# Patient Record
Sex: Female | Born: 1950 | Race: White | Hispanic: No | Smoking: Never smoker
Health system: Southern US, Community
[De-identification: ages and names within clinical notes are randomized; demographics above are authoritative.]

## PROBLEM LIST (undated history)

## (undated) DIAGNOSIS — I10 Essential (primary) hypertension: Secondary | ICD-10-CM

## (undated) DIAGNOSIS — F329 Major depressive disorder, single episode, unspecified: Secondary | ICD-10-CM

## (undated) DIAGNOSIS — Z8249 Family history of ischemic heart disease and other diseases of the circulatory system: Secondary | ICD-10-CM

## (undated) DIAGNOSIS — H219 Unspecified disorder of iris and ciliary body: Secondary | ICD-10-CM

## (undated) DIAGNOSIS — F419 Anxiety disorder, unspecified: Secondary | ICD-10-CM

## (undated) DIAGNOSIS — G43909 Migraine, unspecified, not intractable, without status migrainosus: Secondary | ICD-10-CM

## (undated) DIAGNOSIS — G894 Chronic pain syndrome: Secondary | ICD-10-CM

## (undated) DIAGNOSIS — E079 Disorder of thyroid, unspecified: Secondary | ICD-10-CM

## (undated) DIAGNOSIS — E119 Type 2 diabetes mellitus without complications: Secondary | ICD-10-CM

## (undated) DIAGNOSIS — K589 Irritable bowel syndrome without diarrhea: Secondary | ICD-10-CM

## (undated) DIAGNOSIS — F32A Depression, unspecified: Secondary | ICD-10-CM

## (undated) DIAGNOSIS — G5600 Carpal tunnel syndrome, unspecified upper limb: Secondary | ICD-10-CM

## (undated) DIAGNOSIS — E785 Hyperlipidemia, unspecified: Secondary | ICD-10-CM

## (undated) DIAGNOSIS — K219 Gastro-esophageal reflux disease without esophagitis: Secondary | ICD-10-CM

## (undated) HISTORY — DX: Gastro-esophageal reflux disease without esophagitis: K21.9

## (undated) HISTORY — DX: Family history of ischemic heart disease and other diseases of the circulatory system: Z82.49

## (undated) HISTORY — PX: KNEE SURGERY: SHX244

## (undated) HISTORY — DX: Major depressive disorder, single episode, unspecified: F32.9

## (undated) HISTORY — PX: MENISCUS REPAIR: SHX5179

## (undated) HISTORY — DX: Depression, unspecified: F32.A

## (undated) HISTORY — DX: Unspecified disorder of iris and ciliary body: H21.9

## (undated) HISTORY — DX: Hyperlipidemia, unspecified: E78.5

## (undated) HISTORY — DX: Disorder of thyroid, unspecified: E07.9

## (undated) HISTORY — DX: Carpal tunnel syndrome, unspecified upper limb: G56.00

## (undated) HISTORY — PX: MULTIPLE TOOTH EXTRACTIONS: SHX2053

## (undated) HISTORY — PX: OTHER SURGICAL HISTORY: SHX169

## (undated) HISTORY — PX: VAGINAL HYSTERECTOMY: SUR661

## (undated) HISTORY — DX: Migraine, unspecified, not intractable, without status migrainosus: G43.909

## (undated) HISTORY — DX: Type 2 diabetes mellitus without complications: E11.9

## (undated) HISTORY — PX: COLPOSCOPY: SHX161

## (undated) HISTORY — DX: Anxiety disorder, unspecified: F41.9

## (undated) HISTORY — DX: Essential (primary) hypertension: I10

## (undated) HISTORY — DX: Irritable bowel syndrome, unspecified: K58.9

## (undated) HISTORY — DX: Chronic pain syndrome: G89.4

---

## 1997-07-03 ENCOUNTER — Other Ambulatory Visit: Admission: RE | Admit: 1997-07-03 | Discharge: 1997-07-03 | Payer: Self-pay | Admitting: Obstetrics and Gynecology

## 1998-11-19 ENCOUNTER — Other Ambulatory Visit: Admission: RE | Admit: 1998-11-19 | Discharge: 1998-11-19 | Payer: Self-pay | Admitting: Obstetrics and Gynecology

## 1999-01-25 ENCOUNTER — Encounter (INDEPENDENT_AMBULATORY_CARE_PROVIDER_SITE_OTHER): Payer: Self-pay

## 1999-01-25 ENCOUNTER — Ambulatory Visit (HOSPITAL_COMMUNITY): Admission: RE | Admit: 1999-01-25 | Discharge: 1999-01-25 | Payer: Self-pay | Admitting: Obstetrics and Gynecology

## 1999-12-17 ENCOUNTER — Other Ambulatory Visit: Admission: RE | Admit: 1999-12-17 | Discharge: 1999-12-17 | Payer: Self-pay | Admitting: Obstetrics and Gynecology

## 2001-03-22 ENCOUNTER — Other Ambulatory Visit: Admission: RE | Admit: 2001-03-22 | Discharge: 2001-03-22 | Payer: Self-pay | Admitting: Obstetrics and Gynecology

## 2001-05-17 ENCOUNTER — Encounter: Admission: RE | Admit: 2001-05-17 | Discharge: 2001-08-15 | Payer: Self-pay

## 2002-11-08 ENCOUNTER — Other Ambulatory Visit: Admission: RE | Admit: 2002-11-08 | Discharge: 2002-11-08 | Payer: Self-pay | Admitting: Obstetrics and Gynecology

## 2003-02-23 ENCOUNTER — Ambulatory Visit (HOSPITAL_COMMUNITY): Admission: RE | Admit: 2003-02-23 | Discharge: 2003-02-23 | Payer: Self-pay | Admitting: Rheumatology

## 2004-02-08 ENCOUNTER — Ambulatory Visit (HOSPITAL_COMMUNITY): Admission: RE | Admit: 2004-02-08 | Discharge: 2004-02-08 | Payer: Self-pay | Admitting: Neurology

## 2004-03-01 ENCOUNTER — Ambulatory Visit: Payer: Self-pay | Admitting: Neurology

## 2005-03-25 ENCOUNTER — Ambulatory Visit: Payer: Self-pay | Admitting: Orthopedic Surgery

## 2005-04-08 ENCOUNTER — Encounter: Admission: RE | Admit: 2005-04-08 | Discharge: 2005-04-08 | Payer: Self-pay | Admitting: Neurology

## 2005-05-01 ENCOUNTER — Encounter: Admission: RE | Admit: 2005-05-01 | Discharge: 2005-05-01 | Payer: Self-pay | Admitting: Neurology

## 2005-07-16 ENCOUNTER — Ambulatory Visit: Payer: Self-pay | Admitting: Gastroenterology

## 2008-03-13 ENCOUNTER — Ambulatory Visit: Payer: Self-pay | Admitting: Unknown Physician Specialty

## 2008-03-13 ENCOUNTER — Ambulatory Visit: Payer: Self-pay | Admitting: Cardiology

## 2008-04-03 ENCOUNTER — Ambulatory Visit: Payer: Self-pay | Admitting: Cardiology

## 2008-04-20 HISTORY — PX: CARPAL TUNNEL RELEASE: SHX101

## 2008-07-12 ENCOUNTER — Ambulatory Visit: Payer: Self-pay | Admitting: Unknown Physician Specialty

## 2008-07-18 ENCOUNTER — Ambulatory Visit: Payer: Self-pay | Admitting: Unknown Physician Specialty

## 2008-12-20 ENCOUNTER — Ambulatory Visit: Payer: Self-pay | Admitting: Internal Medicine

## 2009-09-08 ENCOUNTER — Ambulatory Visit: Payer: Self-pay | Admitting: Diagnostic Radiology

## 2009-09-08 ENCOUNTER — Ambulatory Visit (HOSPITAL_BASED_OUTPATIENT_CLINIC_OR_DEPARTMENT_OTHER): Admission: RE | Admit: 2009-09-08 | Discharge: 2009-09-08 | Payer: Self-pay | Admitting: Rheumatology

## 2010-02-10 ENCOUNTER — Encounter: Payer: Self-pay | Admitting: Neurology

## 2010-05-21 ENCOUNTER — Institutional Professional Consult (permissible substitution): Payer: Self-pay | Admitting: Internal Medicine

## 2010-06-07 NOTE — Op Note (Signed)
NAMEDAYLYNN, STUMPP NO.:  [id number]   MEDICAL RECORD NO.:  [id number]          PATIENT TYPE:  OUT   LOCATION:  [facility]                          FACILITY:  [facility]   PHYSICIAN:  Marlan Palau, M.D.  DATE OF BIRTH:  [birth year]   DATE OF PROCEDURE:  02/08/2004  DATE OF DISCHARGE:                                 OPERATIVE REPORT   HISTORY:  This is a 60 year old patient with a history of memory disturbance  and gait disorder with white matter changes on MRI.  The patient is being  evaluated for these changes.   A lumbar puncture was performed with the patient in the fetal position on  the left side.  The low back was cleaned with Betadine solution, and  approximately 2 mL of 1% Xylocaine was used as a local anesthetic.  A 20-  gauge spinal needle was inserted into the L3-4 interspace and approximately  16 mL of clear, colorless spinal fluid was removed for testing.  The opening  pressure was 180 mmH2O.  Tube #1 was sent for VDRL, cryptococcal antigen,  angiotensin-converting enzyme level.  Tube #2 was sent for oligoclonal  banding, IgG/albumin ratio.  Tube #3 was sent for cells, differential,  glucose, protein.  Tube #4 was sent for Lyme antibody panel.  Blood work was  sent for ANA, rheumatoid factor, sedimentation rate, antiphospholipid  antibody panel, lupus anticoagulant antibody, factor V Leiden.  There were  no complications of the above procedure.  The patient tolerated the  procedure well.       CKW/MEDQ  D:  02/08/2004  T:  02/08/2004  Job:  [id number]

## 2010-06-07 NOTE — Op Note (Signed)
[facility]  Patient:    Shannon Flores                        MRN: [id number] Proc. Date: 01/25/99 Adm. Date:  [id number] Disc. Date: [id number] Attending:  Morene Antu                           Operative Report  PREOPERATIVE DIAGNOSES:  Menorrhagia; thickened endometrium.  POSTOPERATIVE DIAGNOSIS:  Menorrhagia; thickened endometrium.  PROCEDURE:  Dilatation and curettage; hysteroscopy; resectoscopic excision of thickened endometrium.  SURGEON:  Sherry A. Rosalio Macadamia, M.D.  ANESTHESIA:  MAC.  INDICATIONS:  This is a 60 year old G3, P3-0-0-2 woman who has been having irregular and heavy bleeding.  Patient had ultrasound revealing very thickened endometrium, consistent with polypoid tissue; therefore, the patient is brought to the operating room for D&C/hysteroscopy with resection.  FINDINGS:  Normal-size anteflexed uterus with polypoid endometrium.  DESCRIPTION OF PROCEDURE:  Patient was brought into the operating room, given adequate IV sedation, she was placed in the dorsal lithotomy position and her perineum and vagina were washed with Betadine.  Patient was draped in a sterile  fashion.  Paracervical block was administered with 1% Nesacaine.  Anterior lip f the cervix was grasped with a single-tooth tenaculum.  Cervix was sounded. Cervix was dilated with Pratt dilators to a #31.  The resectoscope was easily introduced into the endometrial cavity; pictures were obtained.  Using a double right-angled loop at 190 watts, the polypoid tissue was resected in sheets.  Once this was completed, adequate hemostasis was present.  Pictures were obtained.  All instruments were then removed from the vagina.  Patient was taken out of the dorsal lithotomy position, she was awakened and she was moved from the operating table to a stretcher in stable condition.  Complications were none.  Estimated blood loss: Less than 5 cc. DD:   02/20/99 TD:  02/20/99 Job: [id number]
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
## 2010-06-10 ENCOUNTER — Institutional Professional Consult (permissible substitution): Payer: Self-pay | Admitting: Internal Medicine

## 2010-06-24 ENCOUNTER — Ambulatory Visit (INDEPENDENT_AMBULATORY_CARE_PROVIDER_SITE_OTHER): Payer: Medicare Other | Admitting: Internal Medicine

## 2010-06-24 ENCOUNTER — Encounter: Payer: Self-pay | Admitting: Internal Medicine

## 2010-06-24 DIAGNOSIS — R000 Tachycardia, unspecified: Secondary | ICD-10-CM

## 2010-06-24 DIAGNOSIS — R9431 Abnormal electrocardiogram [ECG] [EKG]: Secondary | ICD-10-CM

## 2010-06-24 DIAGNOSIS — I498 Other specified cardiac arrhythmias: Secondary | ICD-10-CM

## 2010-06-24 NOTE — Assessment & Plan Note (Signed)
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 She is on multiple medications. Has not yet had a chance to review the interplay between the 2.  Genetic evaluation is probably are most efficient pool at this juncture. I will look into getting this performed.

## 2010-06-24 NOTE — Assessment & Plan Note (Signed)
The patient has sinus tachycardia. She apparently has had sinus tachycardia at 1:30 or 40 years ago intermittently. Her TSH is a little bit low. This is being followed by Dr. Graciela Husbands

## 2010-06-24 NOTE — Progress Notes (Signed)
HPI: Shannon Flores is a 60 y.o. female Seen for possible long QT syndrome. 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  These episodes have occurred typically with a prodrome of flushing warmth and nausea. She does not recall recovery fatigue. They have been associated with migraine headaches as well as been in the emergency room with her children.  The family history is notable for her 72 year old son dying suddenly. He had antecedent history of syncope. His autopsy apparently showed myocarditis though not fulminant. Postmortem diagnosis of long QT was made based on an abnormal response to exercise by his younger sister. I had seen her subsequently and was not convinced of a diagnosis of long QT. She was referred to Dr. Kathryne Sharper at the [facility] who felt that she did not have long QT. This obviously raises the question as to what the diagnosis was of the deceased son.  The patient takes a whole slew of medications. Current Outpatient Prescriptions  Medication Sig Dispense Refill  . aspirin 81 MG EC tablet Take 81 mg by mouth daily.        Marland Kitchen atenolol (TENORMIN) 100 MG tablet Take 100 mg by mouth daily.        Marland Kitchen buPROPion (WELLBUTRIN SR) 150 MG 12 hr tablet 150 mg. Take two tablet each am and one tablet daily        . calcium carbonate (OS-CAL) 600 MG TABS Take 600 mg by mouth 2 (two) times daily with a meal.        . cetirizine (ZYRTEC) 10 MG tablet Take 10 mg by mouth daily.        . Cholecalciferol (VITAMIN D) 400 UNITS capsule Take 400 Units by mouth daily.        . citalopram (CELEXA) 40 MG tablet Take 40 mg by mouth daily.        . cyclobenzaprine (FLEXERIL) 10 MG tablet Take 10 mg by mouth 2 (two) times daily as  needed.        Marland Kitchen estradiol (CLIMARA - DOSED IN MG/24 HR) 0.0375 mg/24hr Place 1 patch onto the skin once a week.        . estrogens, conjugated, (PREMARIN) 0.625 MG tablet Take 0.625 mg by mouth daily. Take daily for 21 days then do not take for 7 days.       . Eszopiclone (ESZOPICLONE) 3 MG TABS Take 3 mg by mouth at bedtime. Take immediately before bedtime       . gabapentin (NEURONTIN) 300 MG capsule 300 mg. Take one capsule by mouth at bedtime.       Marland Kitchen levothyroxine (SYNTHROID, LEVOTHROID) 50 MCG tablet Take 50 mcg by mouth daily.        . Multiple Vitamin (MULTIVITAMIN) tablet Take 1 tablet by mouth daily.        . progesterone (PROMETRIUM) 100 MG capsule Take 100 mg by mouth daily.        . RABEprazole (ACIPHEX) 20 MG tablet Take 20 mg by mouth daily.        Marland Kitchen DISCONTD: aspirin 325 MG tablet Take 325 mg by mouth daily.        Marland Kitchen ALPRAZolam (XANAX) 0.5 MG tablet Take 0.5 mg by mouth 3 (three) times daily as needed.        Marland Kitchen  DISCONTD: albuterol (PROVENTIL,VENTOLIN) 90 MCG/ACT inhaler Inhale 2 puffs into the lungs every 6 (six) hours as needed.        Marland Kitchen DISCONTD: RABEprazole (ACIPHEX) 20 MG tablet 20 mg. Take one table twice a day       . DISCONTD: terconazole (TERAZOL 7) 0.4 % vaginal cream Place 1 applicator vaginally at bedtime.        Marland Kitchen DISCONTD: triamcinolone (KENALOG) 0.1 % ointment Apply topically 2 (two) times daily.          Allergies  Allergen Reactions  . Benadryl (Altaryl)   . Ceclor (Cefaclor)   . Diflucan (Fluconazole, Injectable)   . Epinephrine   . Erythromycin   . Imitrex (Sumatriptan Base)   . Iodine     IV Dye, Iodine containing contrast media group   . Iohexol   . Naprelan (Naproxen Sodium)     rash  . Provera (Medroxyprogesterone Acetate)   . Prozac (Fluoxetine Hcl)   . Sulfa Antibiotics   . Tetracyclines & Related     Past Medical History  Diagnosis Date  . Hypertension   . Thyroid disease     hypothyroidism  . Depression   . Anxiety   . GERD  (gastroesophageal reflux disease)   . Asthma   . Allergic rhinitis   . IBS (irritable bowel syndrome)   . Migraine headache   . Chronic pain syndrome   . Family history of long QT syndrome   . Non-cystic lesion of iris   . Hyperlipidemia   . Carpal tunnel syndrome     Past Surgical History  Procedure Date  . Vaginal hysterectomy   . Meniscal tear   . Meniscus repair     right knee   . Carpal tunnel release 04/2008  . Colposcopy   . Multiple tooth extractions     Family History  Problem Relation Age of Onset  . Hypertension Other   . Lung cancer      History   Social History  . Marital Status: Divorced    Spouse Name: N/A    Number of Children: N/A  . Years of Education: N/A   Occupational History  . Not on file.   Social History Main Topics  . Smoking status: Never Smoker   . Smokeless tobacco: Never Used  . Alcohol Use: No  . Drug Use: No  . Sexually Active:    Other Topics Concern  . Not on file   Social History Narrative  . No narrative on file    Fourteen point review of systems was negative except as noted in HPI and PMH   PHYSICAL EXAMINATION  Blood pressure 108/68, pulse 112, height 5\' 7"  (1.702 m), weight 274 lb 12.8 oz (124.648 kg).   Well developed and nourished in no acute distress HENT normal except she is edentulous Neck supple with JVP-flat Carotids brisk and full without bruits Back without scoliosis or kyphosis Clear Regular rate and rhythm, no murmurs or gallops Abd-soft with active BS without hepatomegaly or midline pulsation Femoral pulses 2+ distal pulses intact No Clubbing cyanosis edema Skin-warm and dry LN-neg submandibular and supraclavicular A & Oriented CN 3-12 normal  Grossly normal sensory and motor function Affect somewhat anxious . ECG today demonstrates sinus rhythm at 110 with intervals of 0.12/0.10/0.38 with a QTC of 0.56.  Electrocardiogram from [facility] dated 2010 demonstrated sinus rhythm at 88 with intervals  of 0.16/0.09/0.40 the QTC of 0.48.no other cardiograms are available for review

## 2010-07-30 ENCOUNTER — Encounter: Payer: Self-pay | Admitting: Internal Medicine

## 2010-12-06 IMAGING — CR DG CHEST 2V
1 series · 2 of 2 positions shown · non-contrast
Comparison: none

REASON FOR EXAM: htn
COMMENTS:

PROCEDURE:     DXR - DXR CHEST PA (OR AP) AND LATERAL  - March 13, 2008  [DATE]
RESULT:     The lungs are clear. Cardiovascular structures are unremarkable.

[Series 1: view not recorded · 0.17mm/px · 2 of 2 slices shown]
[im 1/2]
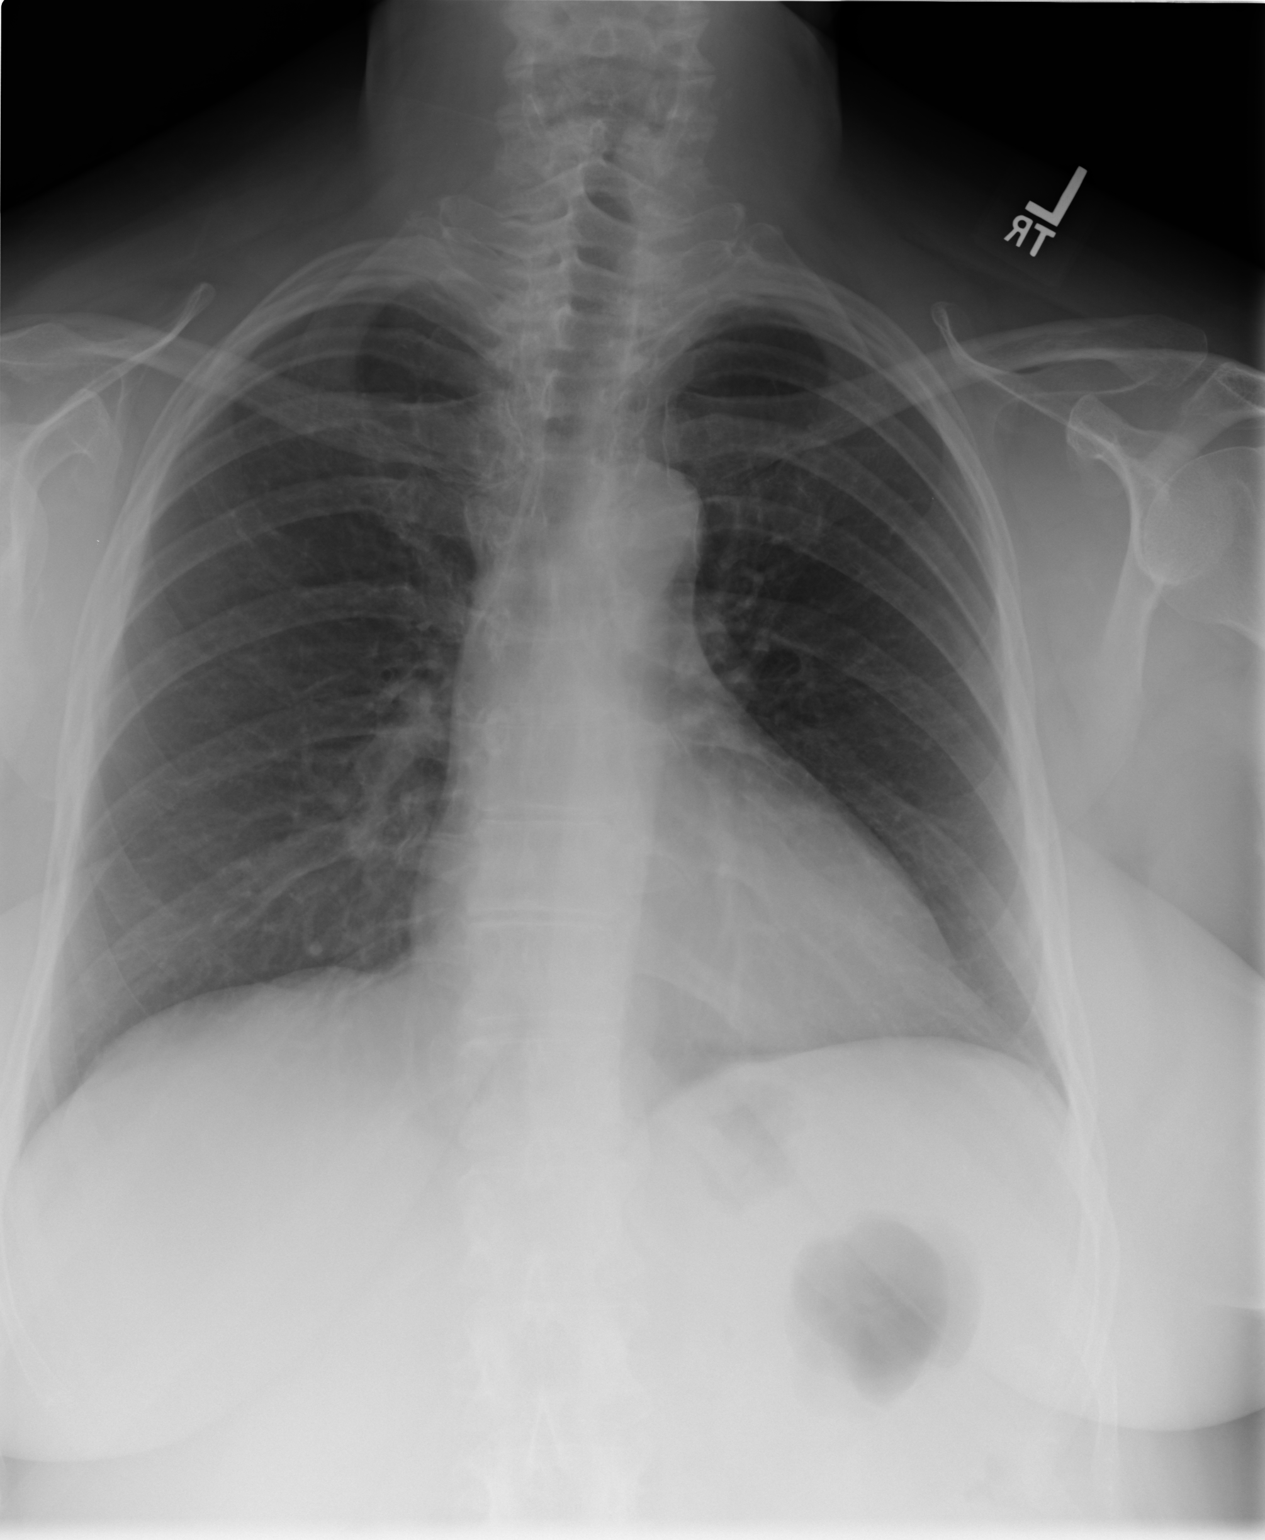
[im 2/2]
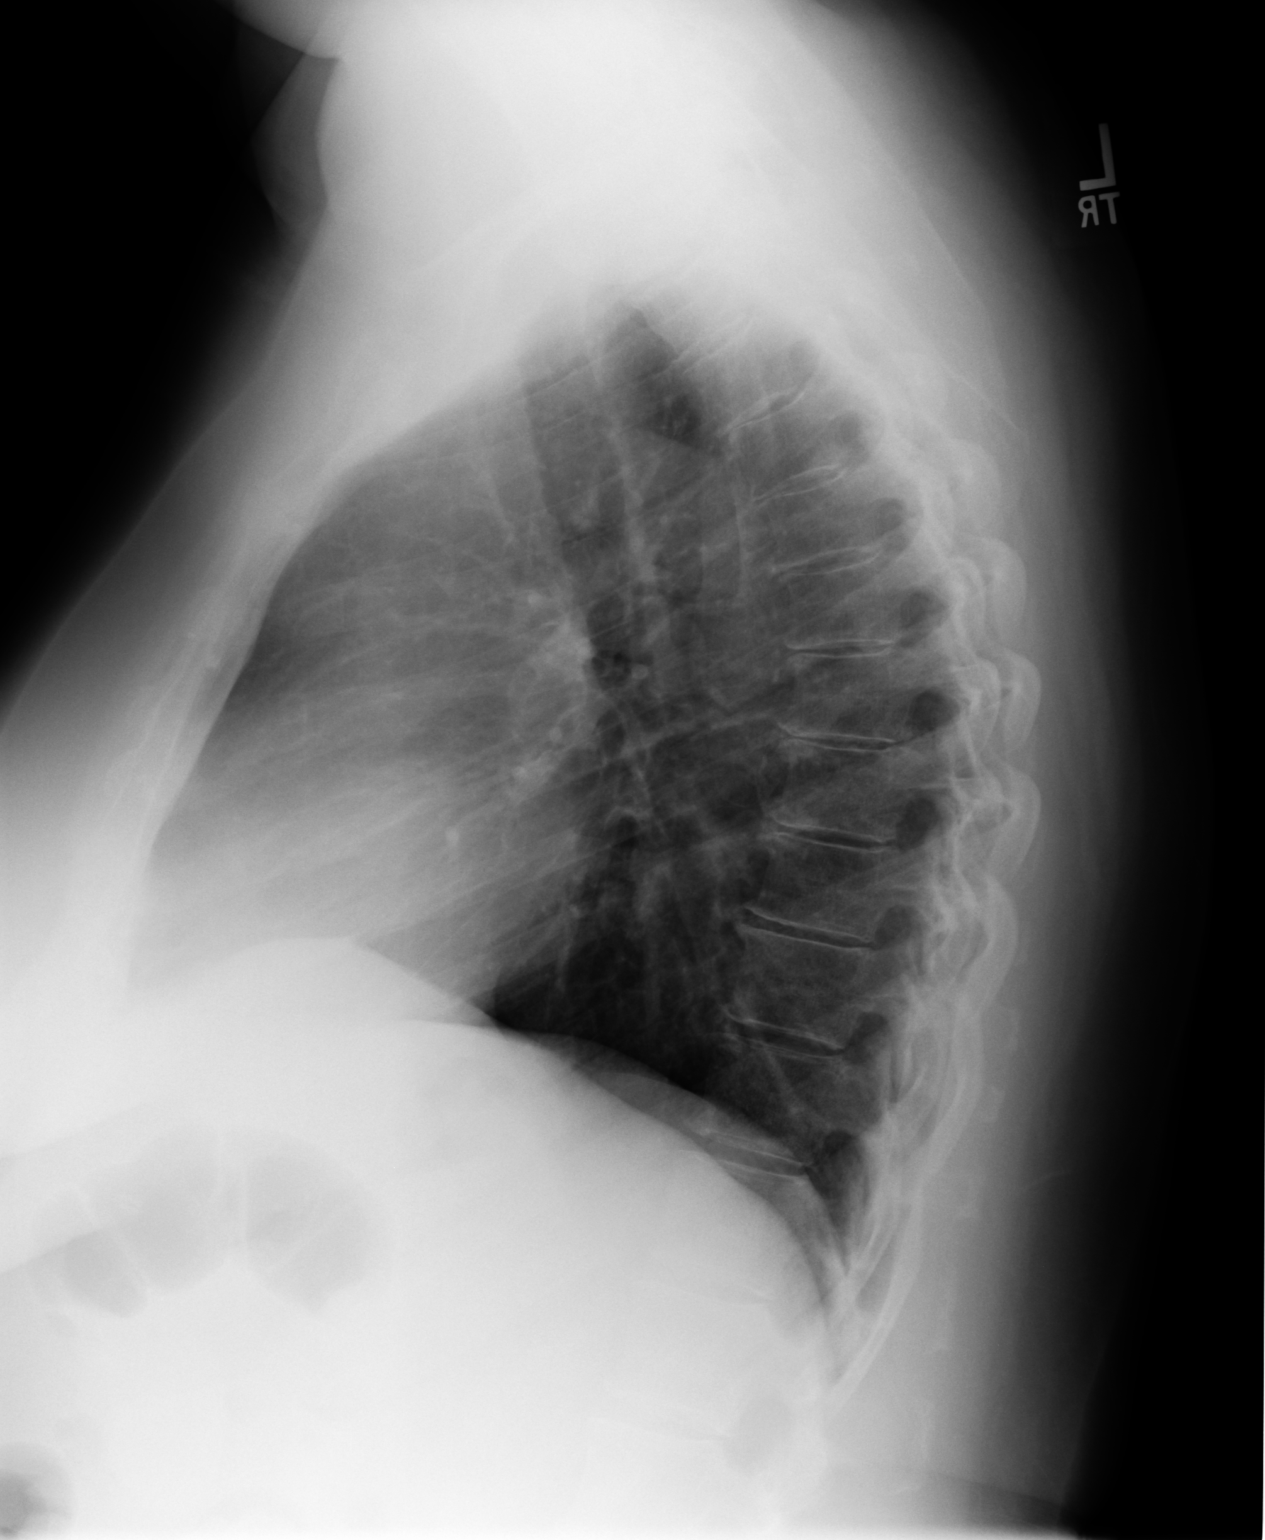

[2 of 2 positions shown; findings below may reference images not displayed]

IMPRESSION: 1. No acute cardiopulmonary disease.

## 2011-10-30 ENCOUNTER — Encounter: Payer: Self-pay | Admitting: Internal Medicine

## 2014-03-20 DIAGNOSIS — N3941 Urge incontinence: Secondary | ICD-10-CM | POA: Insufficient documentation

## 2014-03-20 DIAGNOSIS — R270 Ataxia, unspecified: Secondary | ICD-10-CM | POA: Insufficient documentation

## 2015-02-14 ENCOUNTER — Other Ambulatory Visit: Payer: Self-pay | Admitting: Internal Medicine

## 2015-02-14 DIAGNOSIS — R29898 Other symptoms and signs involving the musculoskeletal system: Secondary | ICD-10-CM

## 2015-02-14 DIAGNOSIS — M5136 Other intervertebral disc degeneration, lumbar region: Secondary | ICD-10-CM | POA: Insufficient documentation

## 2015-02-22 ENCOUNTER — Ambulatory Visit: Payer: Medicare Other

## 2015-06-01 ENCOUNTER — Encounter: Payer: Self-pay | Admitting: Physical Medicine & Rehabilitation

## 2015-06-13 ENCOUNTER — Encounter: Payer: Self-pay | Admitting: Physical Medicine & Rehabilitation

## 2015-06-13 ENCOUNTER — Encounter: Payer: Medicare Other | Attending: Physical Medicine & Rehabilitation | Admitting: Physical Medicine & Rehabilitation

## 2015-06-13 VITALS — BP 124/84 | HR 80 | Resp 16

## 2015-06-13 DIAGNOSIS — Z7982 Long term (current) use of aspirin: Secondary | ICD-10-CM | POA: Insufficient documentation

## 2015-06-13 DIAGNOSIS — G8929 Other chronic pain: Secondary | ICD-10-CM | POA: Insufficient documentation

## 2015-06-13 DIAGNOSIS — M150 Primary generalized (osteo)arthritis: Secondary | ICD-10-CM | POA: Diagnosis not present

## 2015-06-13 DIAGNOSIS — R269 Unspecified abnormalities of gait and mobility: Secondary | ICD-10-CM | POA: Diagnosis not present

## 2015-06-13 DIAGNOSIS — J45909 Unspecified asthma, uncomplicated: Secondary | ICD-10-CM | POA: Insufficient documentation

## 2015-06-13 DIAGNOSIS — K219 Gastro-esophageal reflux disease without esophagitis: Secondary | ICD-10-CM | POA: Insufficient documentation

## 2015-06-13 DIAGNOSIS — G5600 Carpal tunnel syndrome, unspecified upper limb: Secondary | ICD-10-CM | POA: Diagnosis not present

## 2015-06-13 DIAGNOSIS — R252 Cramp and spasm: Secondary | ICD-10-CM | POA: Insufficient documentation

## 2015-06-13 DIAGNOSIS — M62838 Other muscle spasm: Secondary | ICD-10-CM | POA: Diagnosis not present

## 2015-06-13 DIAGNOSIS — M2140 Flat foot [pes planus] (acquired), unspecified foot: Secondary | ICD-10-CM | POA: Insufficient documentation

## 2015-06-13 DIAGNOSIS — R200 Anesthesia of skin: Secondary | ICD-10-CM | POA: Insufficient documentation

## 2015-06-13 DIAGNOSIS — Z90710 Acquired absence of both cervix and uterus: Secondary | ICD-10-CM | POA: Diagnosis not present

## 2015-06-13 DIAGNOSIS — G43909 Migraine, unspecified, not intractable, without status migrainosus: Secondary | ICD-10-CM | POA: Insufficient documentation

## 2015-06-13 DIAGNOSIS — IMO0002 Reserved for concepts with insufficient information to code with codable children: Secondary | ICD-10-CM

## 2015-06-13 DIAGNOSIS — G894 Chronic pain syndrome: Secondary | ICD-10-CM | POA: Diagnosis not present

## 2015-06-13 DIAGNOSIS — G5603 Carpal tunnel syndrome, bilateral upper limbs: Secondary | ICD-10-CM | POA: Diagnosis not present

## 2015-06-13 DIAGNOSIS — E785 Hyperlipidemia, unspecified: Secondary | ICD-10-CM | POA: Diagnosis not present

## 2015-06-13 DIAGNOSIS — R9431 Abnormal electrocardiogram [ECG] [EKG]: Secondary | ICD-10-CM

## 2015-06-13 DIAGNOSIS — G43709 Chronic migraine without aura, not intractable, without status migrainosus: Secondary | ICD-10-CM | POA: Diagnosis not present

## 2015-06-13 DIAGNOSIS — J309 Allergic rhinitis, unspecified: Secondary | ICD-10-CM | POA: Diagnosis not present

## 2015-06-13 DIAGNOSIS — Z79899 Other long term (current) drug therapy: Secondary | ICD-10-CM | POA: Insufficient documentation

## 2015-06-13 DIAGNOSIS — M797 Fibromyalgia: Secondary | ICD-10-CM | POA: Diagnosis not present

## 2015-06-13 DIAGNOSIS — Z5181 Encounter for therapeutic drug level monitoring: Secondary | ICD-10-CM | POA: Diagnosis not present

## 2015-06-13 DIAGNOSIS — I10 Essential (primary) hypertension: Secondary | ICD-10-CM | POA: Insufficient documentation

## 2015-06-13 DIAGNOSIS — M170 Bilateral primary osteoarthritis of knee: Secondary | ICD-10-CM | POA: Diagnosis not present

## 2015-06-13 DIAGNOSIS — E079 Disorder of thyroid, unspecified: Secondary | ICD-10-CM | POA: Diagnosis not present

## 2015-06-13 DIAGNOSIS — M159 Polyosteoarthritis, unspecified: Secondary | ICD-10-CM

## 2015-06-13 DIAGNOSIS — E039 Hypothyroidism, unspecified: Secondary | ICD-10-CM | POA: Diagnosis not present

## 2015-06-13 DIAGNOSIS — F418 Other specified anxiety disorders: Secondary | ICD-10-CM | POA: Diagnosis not present

## 2015-06-13 DIAGNOSIS — I4581 Long QT syndrome: Secondary | ICD-10-CM | POA: Insufficient documentation

## 2015-06-13 DIAGNOSIS — K589 Irritable bowel syndrome without diarrhea: Secondary | ICD-10-CM | POA: Insufficient documentation

## 2015-06-13 MED ORDER — GABAPENTIN 300 MG PO CAPS: 300.0000 mg | ORAL_CAPSULE | Freq: Three times a day (TID) | ORAL | Status: DC

## 2015-06-13 NOTE — Progress Notes (Signed)
Subjective:    Patient ID: Shannon Flores, female    DOB: 20-Sep-1950, 65 y.o.   MRN: [id number]  HPI  65 y/o female with pmh of CTS, chronic pain, depression, migraines, anxiety, IBS, OA, fibromyalgia presents for pain in joints. She has seen a Rheumatologist for 10 years, referred for knee pain.  She has frequent falls. The pain is in multiple joints, mainly in hands and knees.  She was told she needs knee replacement in b/l knees.  This started ~10 years and getting progressively worse.  It is an achy pain.  It is constant.  Rest, heating pad, gabapentin improves the pain.  Driving exacerbates the pain.  She has associated muscle spasms and numbness.  She denies radiation. She had a NCS/EMG ~1 year ago, diagnosed with CTS.  She had surgery on her left, but not on her right.    Pain Inventory Average Pain 7 Pain Right Now 8 My pain is sharp, stabbing and tingling  In the last 24 hours, has pain interfered with the following? General activity 6 Relation with others 5 Enjoyment of life 4 What TIME of day is your pain at its worst? no selection Sleep (in general) Poor  Pain is worse with: walking and bending Pain improves with: rest, heat/ice, medication and injections Relief from Meds: 6  Mobility walk without assistance how many minutes can you walk? 5-10  Function disabled: date disabled . I need assistance with the following:  meal prep, household duties and shopping  Neuro/Psych bladder control problems numbness spasms depression  Prior Studies x-rays new visit  Physicians involved in your care new visit   Family History  Problem Relation Age of Onset  . Hypertension Other   . Lung cancer     Social History   Social History  . Marital Status: Divorced    Spouse Name: N/A  . Number of Children: N/A  . Years of Education: N/A   Social History Main Topics  . Smoking status: Never Smoker   . Smokeless tobacco: Never Used  . Alcohol Use: No  . Drug  Use: No  . Sexual Activity: Not Asked   Other Topics Concern  . None   Social History Narrative   Past Surgical History  Procedure Laterality Date  . Vaginal hysterectomy    . Meniscal tear    . Meniscus repair      right knee   . Carpal tunnel release  04/2008  . Colposcopy    . Multiple tooth extractions     Past Medical History  Diagnosis Date  . Hypertension   . Thyroid disease     hypothyroidism  . Depression   . Anxiety   . GERD (gastroesophageal reflux disease)   . Asthma   . Allergic rhinitis   . IBS (irritable bowel syndrome)   . Migraine headache   . Chronic pain syndrome   . Family history of long QT syndrome   . Non-cystic lesion of iris   . Hyperlipidemia   . Carpal tunnel syndrome    BP 124/84 mmHg  Pulse 80  Resp 16  SpO2 97%  Opioid Risk Score:   Fall Risk Score:  `1  Depression screen PHQ 2/9  Depression screen PHQ 2/9 06/13/2015  Decreased Interest 1  Down, Depressed, Hopeless 2  PHQ - 2 Score 3  Altered sleeping 2  Tired, decreased energy 1  Change in appetite 2  Feeling bad or failure about yourself  1  Trouble concentrating  2  Moving slowly or fidgety/restless 0  Suicidal thoughts 0  PHQ-9 Score 11  Difficult doing work/chores Somewhat difficult   Current Outpatient Prescriptions on File Prior to Visit  Medication Sig Dispense Refill  . ALPRAZolam (XANAX) 0.5 MG tablet Take 0.5 mg by mouth 3 (three) times daily as needed.      Marland Kitchen aspirin 81 MG EC tablet Take 81 mg by mouth daily.      Marland Kitchen atenolol (TENORMIN) 100 MG tablet Take 100 mg by mouth daily.      Marland Kitchen buPROPion (WELLBUTRIN SR) 150 MG 12 hr tablet 150 mg. Take two tablet each am and one tablet daily      . calcium carbonate (OS-CAL) 600 MG TABS Take 600 mg by mouth 2 (two) times daily with a meal.      . cetirizine (ZYRTEC) 10 MG tablet Take 10 mg by mouth daily.      . Cholecalciferol (VITAMIN D) 400 UNITS capsule Take 400 Units by mouth daily.      . citalopram (CELEXA) 40 MG  tablet Take 40 mg by mouth daily.      . cyclobenzaprine (FLEXERIL) 10 MG tablet Take 10 mg by mouth 2 (two) times daily as needed.      . gabapentin (NEURONTIN) 300 MG capsule 300 mg. Take one capsule by mouth at bedtime.     Marland Kitchen levothyroxine (SYNTHROID, LEVOTHROID) 50 MCG tablet Take 50 mcg by mouth daily.      . Multiple Vitamin (MULTIVITAMIN) tablet Take 1 tablet by mouth daily.      . RABEprazole (ACIPHEX) 20 MG tablet Take 20 mg by mouth daily.       No current facility-administered medications on file prior to visit.     Review of Systems  Constitutional: Positive for unexpected weight change.  Gastrointestinal: Positive for vomiting, diarrhea and constipation.  Endocrine:       High blood sugar  Genitourinary: Positive for difficulty urinating.  All other systems reviewed and are negative.      Objective:   Physical Exam Gen: NAD. Vital signs reviewed HENT: Normocephalic, Atraumatic Eyes: EOMI, Conj WNL Cardio: S1, S2 normal, RRR Pulm: B/l clear to auscultation.  Effort normal Abd: Soft, non-distended, non-tender, BS+ MSK: Gait slow cadence.   TTP b/l knees.    No edema.   +B/l pes planus, hallux valgus. Neuro: CN II-XII grossly intact.    Sensation diminished to light touch in b/l feet  Reflexes 2+ throughout  Strength  4+/5 in all LE myotomes Skin: Warm and Dry    Assessment & Plan:  65 y/o female with pmh of CTS, chronic pain, depression, migraines, anxiety, IBS, OA, fibromyalgia presents for pain in joints.   1. Chronic pain secondary to OA  She had injections, without any more benefit.   She was being weaned from narcotics by Rheum.  She has been seen for pain in various joints at different times  Pt with Pes planus, but does not want orthotics  Pt has been referred to pool therapy, but has not gone  Will refer to PT for evaluation and treatment of knee pain and possible TENs unit  Pt wear different shoes that she is comfortable with   Will order Xray  knees  Follow up with Psychology  2. Falls with abnormality of gait  She has 2 walkers  Educated on environmental modifications  Will order quad cane   3. CTS  Will order resting hand splint to be worn at night  and while driving  4.Muscle spasms  Cont meds   5. Prolonged QT syndrome  Will be mindful when prescribing meds  6. Chronic migraines  States she has tried and failed topomax  She has limitations due to prolonged QT  Has been to ED several time  Will see in 2 weeks for evaluation for botox  8. Fibromyalgia  Will increase Gabapentin to 300 TID  Total time spent with patient , 40 minutes spent counseling.

## 2015-06-21 LAB — TOXASSURE SELECT,+ANTIDEPR,UR

## 2015-06-26 NOTE — Progress Notes (Signed)
Urine drug screen for this encounter is consistent for prescribed medications.   

## 2015-07-10 DIAGNOSIS — E1165 Type 2 diabetes mellitus with hyperglycemia: Secondary | ICD-10-CM | POA: Insufficient documentation

## 2015-07-10 DIAGNOSIS — F3341 Major depressive disorder, recurrent, in partial remission: Secondary | ICD-10-CM | POA: Insufficient documentation

## 2015-07-11 ENCOUNTER — Encounter: Payer: Medicare Other | Admitting: Physical Medicine & Rehabilitation

## 2015-07-18 ENCOUNTER — Encounter: Payer: Medicare Other | Attending: Physical Medicine & Rehabilitation | Admitting: Physical Medicine & Rehabilitation

## 2015-07-18 ENCOUNTER — Encounter: Payer: Self-pay | Admitting: Physical Medicine & Rehabilitation

## 2015-07-18 VITALS — BP 123/81 | HR 80 | Resp 14

## 2015-07-18 DIAGNOSIS — M2142 Flat foot [pes planus] (acquired), left foot: Secondary | ICD-10-CM

## 2015-07-18 DIAGNOSIS — K589 Irritable bowel syndrome without diarrhea: Secondary | ICD-10-CM | POA: Diagnosis not present

## 2015-07-18 DIAGNOSIS — M2141 Flat foot [pes planus] (acquired), right foot: Secondary | ICD-10-CM

## 2015-07-18 DIAGNOSIS — Z7982 Long term (current) use of aspirin: Secondary | ICD-10-CM | POA: Diagnosis not present

## 2015-07-18 DIAGNOSIS — G8929 Other chronic pain: Secondary | ICD-10-CM | POA: Insufficient documentation

## 2015-07-18 DIAGNOSIS — G43709 Chronic migraine without aura, not intractable, without status migrainosus: Secondary | ICD-10-CM | POA: Diagnosis not present

## 2015-07-18 DIAGNOSIS — R252 Cramp and spasm: Secondary | ICD-10-CM | POA: Insufficient documentation

## 2015-07-18 DIAGNOSIS — M62838 Other muscle spasm: Secondary | ICD-10-CM | POA: Diagnosis not present

## 2015-07-18 DIAGNOSIS — I4581 Long QT syndrome: Secondary | ICD-10-CM | POA: Diagnosis not present

## 2015-07-18 DIAGNOSIS — G5600 Carpal tunnel syndrome, unspecified upper limb: Secondary | ICD-10-CM | POA: Insufficient documentation

## 2015-07-18 DIAGNOSIS — Z90710 Acquired absence of both cervix and uterus: Secondary | ICD-10-CM | POA: Diagnosis not present

## 2015-07-18 DIAGNOSIS — Z79899 Other long term (current) drug therapy: Secondary | ICD-10-CM | POA: Diagnosis not present

## 2015-07-18 DIAGNOSIS — K219 Gastro-esophageal reflux disease without esophagitis: Secondary | ICD-10-CM | POA: Insufficient documentation

## 2015-07-18 DIAGNOSIS — M797 Fibromyalgia: Secondary | ICD-10-CM | POA: Insufficient documentation

## 2015-07-18 DIAGNOSIS — F418 Other specified anxiety disorders: Secondary | ICD-10-CM | POA: Diagnosis not present

## 2015-07-18 DIAGNOSIS — R200 Anesthesia of skin: Secondary | ICD-10-CM | POA: Diagnosis not present

## 2015-07-18 DIAGNOSIS — J45909 Unspecified asthma, uncomplicated: Secondary | ICD-10-CM | POA: Insufficient documentation

## 2015-07-18 DIAGNOSIS — M170 Bilateral primary osteoarthritis of knee: Secondary | ICD-10-CM | POA: Insufficient documentation

## 2015-07-18 DIAGNOSIS — E039 Hypothyroidism, unspecified: Secondary | ICD-10-CM | POA: Insufficient documentation

## 2015-07-18 DIAGNOSIS — J309 Allergic rhinitis, unspecified: Secondary | ICD-10-CM | POA: Diagnosis not present

## 2015-07-18 DIAGNOSIS — Z5181 Encounter for therapeutic drug level monitoring: Secondary | ICD-10-CM | POA: Diagnosis not present

## 2015-07-18 DIAGNOSIS — E079 Disorder of thyroid, unspecified: Secondary | ICD-10-CM | POA: Insufficient documentation

## 2015-07-18 DIAGNOSIS — G894 Chronic pain syndrome: Secondary | ICD-10-CM | POA: Diagnosis present

## 2015-07-18 DIAGNOSIS — M2140 Flat foot [pes planus] (acquired), unspecified foot: Secondary | ICD-10-CM | POA: Insufficient documentation

## 2015-07-18 DIAGNOSIS — G5603 Carpal tunnel syndrome, bilateral upper limbs: Secondary | ICD-10-CM | POA: Diagnosis not present

## 2015-07-18 DIAGNOSIS — M159 Polyosteoarthritis, unspecified: Secondary | ICD-10-CM

## 2015-07-18 DIAGNOSIS — G43909 Migraine, unspecified, not intractable, without status migrainosus: Secondary | ICD-10-CM | POA: Diagnosis not present

## 2015-07-18 DIAGNOSIS — M150 Primary generalized (osteo)arthritis: Secondary | ICD-10-CM | POA: Diagnosis not present

## 2015-07-18 DIAGNOSIS — E785 Hyperlipidemia, unspecified: Secondary | ICD-10-CM | POA: Insufficient documentation

## 2015-07-18 DIAGNOSIS — IMO0002 Reserved for concepts with insufficient information to code with codable children: Secondary | ICD-10-CM

## 2015-07-18 DIAGNOSIS — R269 Unspecified abnormalities of gait and mobility: Secondary | ICD-10-CM | POA: Insufficient documentation

## 2015-07-18 DIAGNOSIS — I10 Essential (primary) hypertension: Secondary | ICD-10-CM | POA: Insufficient documentation

## 2015-07-18 DIAGNOSIS — R9431 Abnormal electrocardiogram [ECG] [EKG]: Secondary | ICD-10-CM

## 2015-07-18 MED ORDER — TAPENTADOL HCL 50 MG PO TABS: 50.0000 mg | ORAL_TABLET | Freq: Two times a day (BID) | ORAL | Status: DC | PRN

## 2015-07-18 NOTE — Progress Notes (Addendum)
Subjective:    Patient ID: Shannon Flores, female    DOB: 1950/08/31, 65 y.o.   MRN: [id number]  HPI  65 y/o female with pmh of CTS, chronic pain, depression, migraines, anxiety, IBS, OA, fibromyalgia presents for pain in joints. She has seen a Rheumatologist for 10 years, referred for knee pain.  She has frequent falls. The pain is in multiple joints, mainly in hands and knees.  She was told she needs knee replacement in b/l knees.  This started ~10 years and getting progressively worse.  It is an achy pain.  It is constant.  Rest, heating pad, gabapentin improves the pain.  Driving exacerbates the pain.  She has associated muscle spasms and numbness.  She denies radiation. She had a NCS/EMG ~1 year ago, diagnosed with CTS.  She had surgery on her left, but not on her right.  [facility] visit 06/13/15. She was encouraged to participate in pool therapy, which she still has not done.  She has not gone to PT either, as prescribed.   Since last visit, she has had several falls.  She denies feeling dizzy.  She states she now uses the walker.  She has not obtained the quad cane.  He states she had an xray of her knees, but they are not available for view.  She has not obtained the hand splints yet.  Her Gabapentin was increased to 300 TID.    Migraines today 8/10.  10/month.  Sees spots prior.  Sound exacerbates the pain.    Pain Inventory Average Pain 5 Pain Right Now 9 My pain is sharp and aching  In the last 24 hours, has pain interfered with the following? General activity 5 Relation with others 3 Enjoyment of life 3 What TIME of day is your pain at its worst? daytime Sleep (in general) Fair  Pain is worse with: bending, sitting and some activites Pain improves with: rest, heat/ice and medication Relief from Meds: 5  Mobility walk without assistance how many minutes can you walk? 10 ability to climb steps?  yes do you drive?  yes  Function retired I need assistance with the  following:  household duties and shopping  Neuro/Psych bladder control problems spasms dizziness  Prior Studies Any changes since last visit?  no  Physicians involved in your care Any changes since last visit?  no   Family History  Problem Relation Age of Onset  . Hypertension Other   . Lung cancer     Social History   Social History  . Marital Status: Divorced    Spouse Name: N/A  . Number of Children: N/A  . Years of Education: N/A   Social History Main Topics  . Smoking status: Never Smoker   . Smokeless tobacco: Never Used  . Alcohol Use: No  . Drug Use: No  . Sexual Activity: Not Asked   Other Topics Concern  . None   Social History Narrative   Past Surgical History  Procedure Laterality Date  . Vaginal hysterectomy    . Meniscal tear    . Meniscus repair      right knee   . Carpal tunnel release  04/2008  . Colposcopy    . Multiple tooth extractions     Past Medical History  Diagnosis Date  . Hypertension   . Thyroid disease     hypothyroidism  . Depression   . Anxiety   . GERD (gastroesophageal reflux disease)   . Asthma   .  Allergic rhinitis   . IBS (irritable bowel syndrome)   . Migraine headache   . Chronic pain syndrome   . Family history of long QT syndrome   . Non-cystic lesion of iris   . Hyperlipidemia   . Carpal tunnel syndrome    BP 123/81 mmHg  Pulse 80  Resp 14  SpO2 95%  Opioid Risk Score:   Fall Risk Score:  `1  Depression screen PHQ 2/9  Depression screen PHQ 2/9 06/13/2015  Decreased Interest 1  Down, Depressed, Hopeless 2  PHQ - 2 Score 3  Altered sleeping 2  Tired, decreased energy 1  Change in appetite 2  Feeling bad or failure about yourself  1  Trouble concentrating 2  Moving slowly or fidgety/restless 0  Suicidal thoughts 0  PHQ-9 Score 11  Difficult doing work/chores Somewhat difficult   Current Outpatient Prescriptions on File Prior to Visit  Medication Sig Dispense Refill  . ALPRAZolam (XANAX)  0.5 MG tablet Take 0.5 mg by mouth 3 (three) times daily as needed.      Marland Kitchen aspirin 81 MG EC tablet Take 81 mg by mouth daily.      Marland Kitchen atenolol (TENORMIN) 100 MG tablet Take 100 mg by mouth daily.      Marland Kitchen buPROPion (WELLBUTRIN SR) 150 MG 12 hr tablet 150 mg. Take two tablet each am and one tablet daily      . calcium carbonate (OS-CAL) 600 MG TABS Take 600 mg by mouth 2 (two) times daily with a meal.      . cetirizine (ZYRTEC) 10 MG tablet Take 10 mg by mouth daily.      . Cholecalciferol (VITAMIN D) 400 UNITS capsule Take 400 Units by mouth daily.      . citalopram (CELEXA) 40 MG tablet Take 40 mg by mouth daily.      . cyclobenzaprine (FLEXERIL) 10 MG tablet Take 10 mg by mouth 2 (two) times daily as needed.      . gabapentin (NEURONTIN) 300 MG capsule Take 1 capsule (300 mg total) by mouth 3 (three) times daily. 90 capsule 1  . HYDROcodone-acetaminophen (NORCO/VICODIN) 5-325 MG tablet Take 1 tablet by mouth 2 (two) times daily.    Marland Kitchen levothyroxine (SYNTHROID, LEVOTHROID) 50 MCG tablet Take 50 mcg by mouth daily.      . Multiple Vitamin (MULTIVITAMIN) tablet Take 1 tablet by mouth daily.      . RABEprazole (ACIPHEX) 20 MG tablet Take 20 mg by mouth daily.      Marland Kitchen zolpidem (AMBIEN) 5 MG tablet Take 5 mg by mouth at bedtime as needed for sleep.     No current facility-administered medications on file prior to visit.     Review of Systems  Constitutional: Positive for unexpected weight change. Negative for fever.  HENT: Negative.   Eyes: Negative.   Respiratory: Positive for wheezing.   Gastrointestinal: Positive for vomiting, diarrhea and constipation.  Endocrine:       High blood sugar  Genitourinary: Positive for difficulty urinating.  Musculoskeletal:       Spasms  Skin: Negative.   Allergic/Immunologic: Negative.   Neurological: Positive for dizziness.  Hematological: Negative.   Psychiatric/Behavioral: Negative.   All other systems reviewed and are negative.      Objective:     Physical Exam Gen: NAD. Vital signs reviewed HENT: Normocephalic, Atraumatic Eyes: EOMI, Conj WNL Cardio: S1, S2 normal, RRR Pulm: B/l clear to auscultation.  Effort normal Abd: Soft, non-distended, non-tender, BS+ MSK: Gait  slow cadence.   TTP b/l knees.    No edema.   +B/l pes planus, hallux valgus. Neuro: CN II-XII grossly intact.    Sensation diminished to light touch in b/l feet  Reflexes 2+ throughout  Strength  4+/5 in all LE myotomes Skin: Warm and Dry    Assessment & Plan:  65 y/o female with pmh of CTS, chronic pain, depression, migraines, anxiety, IBS, OA, fibromyalgia presents for followup for pain in joints.   1. Chronic pain secondary to OA  She had injections, without any more benefit.   She was being weaned from narcotics by Rheum.   She has been seen for pain in various joints at different times  Pt with Pes planus, but does not want custom orthotics  Pt has been referred to pool therapy, but still has not gone  PT had not gone to PT yet for evaluation and treatment of knee pain and possible TENs unit as well as gait training  Pt states she had xrays of her knees, but results not available  Cont follow up with Psychology  Pt tearful that she will run out of medications.  Will order Nucynta 50mg  BID for this month only and revisit d/c on next visit based on other interventions and d/c ambien  2. Falls with abnormality of gait  She has 2 walkers  Educated on environmental modifications  Pt has not obtained quad cane   3. CTS  Pt has been fitted, but not obtained resting hand splints at night and while driving  4.Muscle spasms  Cont meds   5. Prolonged QT syndrome  Will be mindful when prescribing meds  6. Chronic migraines  States she has tried and failed topomax  She has limitations due to prolonged QT  Has been to ED several time  Will consider botox in future  7. Fibromyalgia  Cont Gabapentin to 300 TID

## 2015-07-18 NOTE — Addendum Note (Signed)
Addended by: Maryla MorrowPATEL, Kolsen Choe A on: 07/18/2015 01:15 PM   Modules accepted: Orders

## 2015-07-31 ENCOUNTER — Encounter: Payer: Medicare Other | Attending: Internal Medicine | Admitting: *Deleted

## 2015-07-31 ENCOUNTER — Encounter: Payer: Self-pay | Admitting: *Deleted

## 2015-07-31 VITALS — BP 108/74 | Ht 67.0 in | Wt 221.9 lb

## 2015-07-31 DIAGNOSIS — E119 Type 2 diabetes mellitus without complications: Secondary | ICD-10-CM | POA: Diagnosis not present

## 2015-07-31 NOTE — Patient Instructions (Signed)
Check blood sugars 2 x day before breakfast and 2 hrs after supper every other day  Exercise: Begin walking for 10 minutes  3 days a week as tolerated  Eat 3 meals day,  1-2  snacks a day Space meals 4-6 hours apart Don't skip meals Avoid sugar sweetened drinks (soda, tea, sports drinks)  Bring blood sugar records to the next class  Call your doctor for a prescription for:  1. Meter strips (type) One Touch Verio checking   1 times per day  2. Lancets (type) One Touch Delica       checking   1     times per day  Return for classes on:

## 2015-07-31 NOTE — Progress Notes (Signed)
Diabetes Self-Management Education  Visit Type: First/Initial  Appt. Start Time: 1325 Appt. End Time: 1500  07/31/2015  Shannon Flores, identified by name and date of birth, is a 65 y.o. female with a diagnosis of Diabetes: Type 2.   ASSESSMENT  Blood pressure 108/74, height  (1.702 m), weight 221 lb 14.4 oz (100.653 kg). Body mass index is 34.75 kg/(m^2).      Diabetes Self-Management Education - 07/31/15 1517    Visit Information   Visit Type First/Initial   Initial Visit   Diabetes Type Type 2   Are you currently following a meal plan? No   Are you taking your medications as prescribed? Yes   Date Diagnosed Pt reports 3 weeks but per [facility] labs she had diabetes on 01/2014 wtih A1C of 6.5 %   Health Coping   How would you rate your overall health? Fair   Psychosocial Assessment   Patient Belief/Attitude about Diabetes Defeat/Burnout  "frustrated"   Self-care barriers None   Self-management support Doctor's office;Family   Patient Concerns Healthy Lifestyle;Medication;Nutrition/Meal planning   Special Needs None   Preferred Learning Style Hands on;Visual   Learning Readiness Ready   How often do you need to have someone help you when you read instructions, pamphlets, or other written materials from your doctor or pharmacy? 1 - Never   What is the last grade level you completed in school? 12   Pre-Education Assessment   Patient understands the diabetes disease and treatment process. Needs Instruction   Patient understands incorporating nutritional management into lifestyle. Needs Instruction   Patient undertands incorporating physical activity into lifestyle. Needs Instruction   Patient understands using medications safely. Needs Instruction   Patient understands monitoring blood glucose, interpreting and using results Needs Instruction   Patient understands prevention, detection, and treatment of acute complications. Needs Instruction   Patient understands  prevention, detection, and treatment of chronic complications. Needs Instruction   Patient understands how to develop strategies to address psychosocial issues. Needs Instruction   Patient understands how to develop strategies to promote health/change behavior. Needs Instruction   Complications   Last HgB A1C per patient/outside source 7.7 %  07/04/15   How often do you check your blood sugar? 0 times/day (not testing)  Provided One Touch Verio Flex and instructed on use. BG upon return demonstration was 118 mg/dL at 0:98 pm - 5 1/2 hrs pp.    Have you had a dilated eye exam in the past 12 months? Yes   Have you had a dental exam in the past 12 months? No  dentures - currently doesn't have any and will need to get replacements   Are you checking your feet? Yes   How many days per week are you checking your feet? 4   Dietary Intake   Breakfast yogurt, fruit   Lunch skips meals and reports she doesn't cook; eats cottage cheese, salad   Dinner lean cuisine, soup, spaghetti   Beverage(s) water, occasional regular soda and sweet tea; Gatorade   Exercise   Exercise Type ADL's   Patient Education   Previous Diabetes Education No   Disease state  Definition of diabetes, type 1 and 2, and the diagnosis of diabetes   Nutrition management  Role of diet in the treatment of diabetes and the relationship between the three main macronutrients and blood glucose level   Physical activity and exercise  Role of exercise on diabetes management, blood pressure control and cardiac health.   Medications Reviewed  patients medication for diabetes, action, purpose, timing of dose and side effects.   Monitoring Taught/evaluated SMBG meter.;Purpose and frequency of SMBG.;Identified appropriate SMBG and/or A1C goals.   Chronic complications Relationship between chronic complications and blood glucose control   Psychosocial adjustment Identified and addressed patients feelings and concerns about diabetes    Individualized Goals (developed by patient)   Reducing Risk Decrease medications Lead a healthier lifestyle   Outcomes   Expected Outcomes Demonstrated interest in learning. Expect positive outcomes      Individualized Plan for Diabetes Self-Management Training:   Learning Objective:  Patient will have a greater understanding of diabetes self-management. Patient education plan is to attend individual and/or group sessions per assessed needs and concerns.   Plan:   Patient Instructions  Check blood sugars 2 x day before breakfast and 2 hrs after supper every other day Exercise: Begin walking for 10 minutes  3 days a week as tolerated Eat 3 meals day,  1-2  snacks a day Space meals 4-6 hours apart Don't skip meals Avoid sugar sweetened drinks (soda, tea, sports drinks) Bring blood sugar records to the next class Call your doctor for a prescription for:  1. Meter strips (type) One Touch Verio checking   1 times per day  2. Lancets (type) One Touch Delica       checking   1     times per day   Expected Outcomes:  Demonstrated interest in learning. Expect positive outcomes  Education material provided:  General Meal Planning Guidelines Simple Meal Plan Meter - One Touch Verio Flex  If problems or questions, patient to contact team via:  Sharion SettlerSheila Manasseh Pittsley, RN, CCM, CDE [phone]  Future DSME appointment:   Pt to check her other MD appointments and call back to schedule classes. She was provided a class schedule for the next few months.

## 2015-08-15 ENCOUNTER — Ambulatory Visit: Payer: Medicare Other | Admitting: Physical Medicine & Rehabilitation

## 2015-08-23 ENCOUNTER — Telehealth: Payer: Self-pay | Admitting: Physical Medicine & Rehabilitation

## 2015-08-23 NOTE — Telephone Encounter (Signed)
Patient needing a refill on Nucynta.  Please call patient when this is done [phone].

## 2015-08-23 NOTE — Telephone Encounter (Signed)
She has to have an appt to get the Nucynta.  She was supposed to be seen around 08/15/15. She will need to be seen.

## 2015-08-23 NOTE — Telephone Encounter (Signed)
Patient is needing a refill on Neurontin.  Please call her at [phone].  I also called patient and told her she needed to make an appointment, I scheduled her for next Wednesday at 10 with Dr. Allena Katz.

## 2015-08-29 ENCOUNTER — Encounter: Payer: Self-pay | Admitting: Physical Medicine & Rehabilitation

## 2015-08-29 ENCOUNTER — Encounter: Payer: Medicare Other | Attending: Physical Medicine & Rehabilitation | Admitting: Physical Medicine & Rehabilitation

## 2015-08-29 VITALS — BP 137/92 | HR 80

## 2015-08-29 DIAGNOSIS — K219 Gastro-esophageal reflux disease without esophagitis: Secondary | ICD-10-CM | POA: Insufficient documentation

## 2015-08-29 DIAGNOSIS — G5600 Carpal tunnel syndrome, unspecified upper limb: Secondary | ICD-10-CM | POA: Diagnosis not present

## 2015-08-29 DIAGNOSIS — F418 Other specified anxiety disorders: Secondary | ICD-10-CM | POA: Insufficient documentation

## 2015-08-29 DIAGNOSIS — K589 Irritable bowel syndrome without diarrhea: Secondary | ICD-10-CM | POA: Insufficient documentation

## 2015-08-29 DIAGNOSIS — J309 Allergic rhinitis, unspecified: Secondary | ICD-10-CM | POA: Diagnosis not present

## 2015-08-29 DIAGNOSIS — E039 Hypothyroidism, unspecified: Secondary | ICD-10-CM | POA: Insufficient documentation

## 2015-08-29 DIAGNOSIS — M159 Polyosteoarthritis, unspecified: Secondary | ICD-10-CM

## 2015-08-29 DIAGNOSIS — G5603 Carpal tunnel syndrome, bilateral upper limbs: Secondary | ICD-10-CM | POA: Diagnosis not present

## 2015-08-29 DIAGNOSIS — G8929 Other chronic pain: Secondary | ICD-10-CM | POA: Diagnosis not present

## 2015-08-29 DIAGNOSIS — M797 Fibromyalgia: Secondary | ICD-10-CM | POA: Diagnosis not present

## 2015-08-29 DIAGNOSIS — R252 Cramp and spasm: Secondary | ICD-10-CM | POA: Insufficient documentation

## 2015-08-29 DIAGNOSIS — R200 Anesthesia of skin: Secondary | ICD-10-CM | POA: Insufficient documentation

## 2015-08-29 DIAGNOSIS — M62838 Other muscle spasm: Secondary | ICD-10-CM | POA: Insufficient documentation

## 2015-08-29 DIAGNOSIS — Z5181 Encounter for therapeutic drug level monitoring: Secondary | ICD-10-CM | POA: Insufficient documentation

## 2015-08-29 DIAGNOSIS — G43709 Chronic migraine without aura, not intractable, without status migrainosus: Secondary | ICD-10-CM | POA: Diagnosis not present

## 2015-08-29 DIAGNOSIS — J45909 Unspecified asthma, uncomplicated: Secondary | ICD-10-CM | POA: Insufficient documentation

## 2015-08-29 DIAGNOSIS — M170 Bilateral primary osteoarthritis of knee: Secondary | ICD-10-CM | POA: Diagnosis not present

## 2015-08-29 DIAGNOSIS — G43909 Migraine, unspecified, not intractable, without status migrainosus: Secondary | ICD-10-CM | POA: Diagnosis not present

## 2015-08-29 DIAGNOSIS — I4581 Long QT syndrome: Secondary | ICD-10-CM

## 2015-08-29 DIAGNOSIS — E079 Disorder of thyroid, unspecified: Secondary | ICD-10-CM | POA: Insufficient documentation

## 2015-08-29 DIAGNOSIS — E785 Hyperlipidemia, unspecified: Secondary | ICD-10-CM | POA: Insufficient documentation

## 2015-08-29 DIAGNOSIS — I10 Essential (primary) hypertension: Secondary | ICD-10-CM | POA: Insufficient documentation

## 2015-08-29 DIAGNOSIS — G894 Chronic pain syndrome: Secondary | ICD-10-CM | POA: Diagnosis not present

## 2015-08-29 DIAGNOSIS — R269 Unspecified abnormalities of gait and mobility: Secondary | ICD-10-CM | POA: Insufficient documentation

## 2015-08-29 DIAGNOSIS — Z79899 Other long term (current) drug therapy: Secondary | ICD-10-CM | POA: Diagnosis not present

## 2015-08-29 DIAGNOSIS — Z90710 Acquired absence of both cervix and uterus: Secondary | ICD-10-CM | POA: Insufficient documentation

## 2015-08-29 DIAGNOSIS — Z7982 Long term (current) use of aspirin: Secondary | ICD-10-CM | POA: Insufficient documentation

## 2015-08-29 DIAGNOSIS — M150 Primary generalized (osteo)arthritis: Secondary | ICD-10-CM | POA: Diagnosis not present

## 2015-08-29 DIAGNOSIS — IMO0002 Reserved for concepts with insufficient information to code with codable children: Secondary | ICD-10-CM

## 2015-08-29 DIAGNOSIS — M2140 Flat foot [pes planus] (acquired), unspecified foot: Secondary | ICD-10-CM | POA: Insufficient documentation

## 2015-08-29 DIAGNOSIS — R9431 Abnormal electrocardiogram [ECG] [EKG]: Secondary | ICD-10-CM

## 2015-08-29 NOTE — Progress Notes (Addendum)
Subjective:    Patient ID: Shannon Flores, female    DOB: 1950/06/10, 65 y.o.   MRN: [id number]  HPI  65 y/o female with pmh of CTS, chronic pain, depression, migraines, anxiety, IBS, OA, fibromyalgia presents for pain in joints. She has seen a Rheumatologist for 10 years, referred for knee pain.  She has frequent falls. The pain is in multiple joints, mainly in hands and knees.  She was told she needs knee replacement in b/l knees.  This started ~10 years and getting progressively worse.  It is an achy pain.  It is constant.  Rest, heating pad, gabapentin improves the pain.  Driving exacerbates the pain.  She has associated muscle spasms and numbness.  She denies radiation. She had a NCS/EMG ~1 year ago, diagnosed with CTS.  She had surgery on her left, but not on her right.    Last clinic visit 07/18/15. She still has not gone to pool therapy.  She still has not gone to PT either, as prescribed.  She denies falls. She denies feeling dizzy.  She uses a cane for ambulation. She obtained the splint for her right hand, which helps.  She was prescribed Nyucinta and has been out for 3 weeks, which she believes helped.   Today, she has migraines today 9/10.  She has associated nausea.  Now, she states it is daily. Sees spots prior.  Sound exacerbates the pain.     Pain Inventory Average Pain 6 Pain Right Now 8 My pain is sharp and aching  In the last 24 hours, has pain interfered with the following? General activity 8 Relation with others 7 Enjoyment of life 7 What TIME of day is your pain at its worst? all Sleep (in general) Poor  Pain is worse with: standing, unsure and some activites Pain improves with: heat/ice and medication Relief from Meds: 6  Mobility use a cane how many minutes can you walk? 5 ability to climb steps?  yes do you drive?  yes Do you have any goals in this area?  yes  Function retired I need assistance with the following:  household duties and  shopping  Neuro/Psych numbness tingling spasms depression  Prior Studies Any changes since last visit?  no  Physicians involved in your care Any changes since last visit?  no   Family History  Problem Relation Age of Onset  . Diabetes Father   . Hypertension Other   . Lung cancer     Social History   Social History  . Marital status: Divorced    Spouse name: N/A  . Number of children: N/A  . Years of education: N/A   Social History Main Topics  . Smoking status: Never Smoker  . Smokeless tobacco: Never Used  . Alcohol use No  . Drug use: No  . Sexual activity: Not Asked   Other Topics Concern  . None   Social History Narrative  . None   Past Surgical History:  Procedure Laterality Date  . CARPAL TUNNEL RELEASE  04/2008  . COLPOSCOPY    . meniscal tear    . MENISCUS REPAIR     right knee   . MULTIPLE TOOTH EXTRACTIONS    . VAGINAL HYSTERECTOMY     Past Medical History:  Diagnosis Date  . Allergic rhinitis   . Anxiety   . Asthma   . Carpal tunnel syndrome   . Chronic pain syndrome   . Depression   . Diabetes mellitus without complication (  HCC)   . Family history of long QT syndrome   . GERD (gastroesophageal reflux disease)   . Hyperlipidemia   . Hypertension   . IBS (irritable bowel syndrome)   . Migraine headache   . Non-cystic lesion of iris   . Thyroid disease    hypothyroidism   BP (!) 137/92   Pulse 80   SpO2 94%   Opioid Risk Score:   Fall Risk Score:  `1  Depression screen PHQ 2/9  [facility] 07/31/2015 06/13/2015  Decreased Interest 2 1  Down, Depressed, Hopeless 2 2  PHQ - 2 Score 4 3  Altered sleeping 1 2  Tired, decreased energy 1 1  Change in appetite 1 2  Feeling bad or failure about yourself  0 1  Trouble concentrating 0 2  Moving slowly or fidgety/restless 0 0  Suicidal thoughts 0 0  PHQ-9 Score 7 11  Difficult doing work/chores Somewhat difficult Somewhat difficult   Current Outpatient  Prescriptions on File Prior to Visit  Medication Sig Dispense Refill  . albuterol (PROAIR HFA) 108 (90 Base) MCG/ACT inhaler Inhale 2 puffs into the lungs every 6 (six) hours as needed.    . ALPRAZolam (XANAX) 0.5 MG tablet Take 0.5 mg by mouth 3 (three) times daily as needed.      Marland Kitchen. aspirin 81 MG EC tablet Take 81 mg by mouth daily.      Marland Kitchen. atenolol (TENORMIN) 100 MG tablet Take 1.5 tablets by mouth daily.    Marland Kitchen. buPROPion (WELLBUTRIN SR) 150 MG 12 hr tablet Take 300 mg by mouth daily. 150 mg in the evening    . cetirizine (ZYRTEC) 10 MG tablet Take 10 mg by mouth daily. Can also take extra 1/2 tablet if needed    . citalopram (CELEXA) 40 MG tablet Take 40 mg by mouth daily.      . clotrimazole-betamethasone (LOTRISONE) cream Apply 1 application topically 2 (two) times daily.     Marland Kitchen. conjugated estrogens (PREMARIN) vaginal cream Place 1 Applicatorful vaginally daily.     . cyclobenzaprine (FLEXERIL) 10 MG tablet Take 10 mg by mouth 2 (two) times daily as needed.      . gabapentin (NEURONTIN) 300 MG capsule Take 1 capsule (300 mg total) by mouth 3 (three) times daily. 90 capsule 1  . HYDROcodone-acetaminophen (NORCO/VICODIN) 5-325 MG tablet Take 1 tablet by mouth 2 (two) times daily.    Marland Kitchen. levothyroxine (SYNTHROID, LEVOTHROID) 50 MCG tablet Take 50 mcg by mouth daily.      . metFORMIN (GLUCOPHAGE) 500 MG tablet Take 500 mg by mouth 2 (two) times daily.    . Multiple Vitamin (MULTIVITAMIN) tablet Take 1 tablet by mouth daily.      Marland Kitchen. oxymetazoline (12 HOUR NASAL SPRAY) 0.05 % nasal spray Place 1 spray into the nose daily as needed.    . RABEprazole (ACIPHEX) 20 MG tablet Take 20 mg by mouth daily.      Marland Kitchen. zolpidem (AMBIEN) 5 MG tablet Take 5 mg by mouth at bedtime as needed for sleep.     No current facility-administered medications on file prior to visit.      Review of Systems  Constitutional: Negative for unexpected weight change.  HENT: Negative.   Eyes: Negative.   Respiratory: Negative.   Negative for wheezing.   Cardiovascular: Negative.   Gastrointestinal: Positive for diarrhea. Negative for constipation and vomiting.  Endocrine: Negative.        High blood sugar  Genitourinary: Negative.  Negative for difficulty urinating.  Musculoskeletal: Negative.        Spasms  Skin: Negative.   Allergic/Immunologic: Negative.   Neurological: Positive for weakness and numbness. Negative for dizziness.  Hematological: Negative.   Psychiatric/Behavioral: Negative.   All other systems reviewed and are negative.      Objective:   Physical Exam Gen: NAD. Vital signs reviewed HENT: Normocephalic, Atraumatic Eyes: EOMI, Conj WNL Cardio: S1, S2 normal, RRR Pulm: B/l clear to auscultation.  Effort normal Abd: Soft, non-distended, non-tender, BS+ MSK: Gait slow cadence.   TTP b/l knees.    No edema.   +B/l pes planus, hallux valgus. Neuro: CN II-XII grossly intact.    Sensation diminished to light touch in b/l feet  Reflexes 2+ throughout  Strength  4+/5 in all LE myotomes Skin: Warm and Dry    Assessment & Plan:  65 y/o female with pmh of CTS, chronic pain, depression, migraines, anxiety, IBS, OA, fibromyalgia presents for followup for pain in joints.   1. Chronic pain secondary to OA  She had injections, without any more benefit.   She was being weaned from narcotics by Rheum.     She has been seen for pain in various joints at different times  Pt with Pes planus, but does not want custom orthotics  Pt has been referred to pool therapy, but still has not gone (2nd time)  PT had not gone to PT yet for evaluation and treatment of knee pain and possible TENs unit as well as gait training (2nd time has not gone).  Pt states she had xrays of her knees, but results not available  Cont follow up with Psychology  Nucynta 50mg  BID ordered last month and pt informed other interventions need to be explored, but she has not followed through.  She has been out of medication for 3  weeks.  Will not reorder at present.   2. Falls with abnormality of gait: Improved  She has 2 walkers  Educated on environmental modifications  Pt has obtained quad cane   3. CTS  Hand splint helping  4.Muscle spasms  Cont meds   5. Prolonged QT syndrome  Will be mindful when prescribing meds  6. Chronic migraines  States she has tried and failed topomax  She has limitations due to prolonged QT  Has been to ED several time  Pt to speak with PCP regarding switching atenolol to Propranolol  Will request authorization for botox    7. Fibromyalgia  Cont Gabapentin to 300 TID

## 2015-08-30 ENCOUNTER — Encounter: Payer: Self-pay | Admitting: *Deleted

## 2015-09-19 ENCOUNTER — Ambulatory Visit: Payer: Medicare Other | Admitting: Diagnostic Neuroimaging

## 2015-09-25 ENCOUNTER — Other Ambulatory Visit: Payer: Self-pay

## 2015-09-25 MED ORDER — GABAPENTIN 300 MG PO CAPS: 300.0000 mg | ORAL_CAPSULE | Freq: Three times a day (TID) | ORAL | 1 refills | Status: DC

## 2015-09-25 NOTE — Telephone Encounter (Signed)
Pt requested a refill on her Gabapentin. Gabapentin sent to pharmacy. Pt is aware.

## 2015-09-28 ENCOUNTER — Ambulatory Visit (INDEPENDENT_AMBULATORY_CARE_PROVIDER_SITE_OTHER): Payer: Medicare Other | Admitting: Neurology

## 2015-09-28 ENCOUNTER — Encounter: Payer: Medicare Other | Admitting: Physical Medicine & Rehabilitation

## 2015-09-28 ENCOUNTER — Encounter: Payer: Self-pay | Admitting: Neurology

## 2015-09-28 DIAGNOSIS — G43711 Chronic migraine without aura, intractable, with status migrainosus: Secondary | ICD-10-CM | POA: Diagnosis not present

## 2015-09-28 DIAGNOSIS — G43909 Migraine, unspecified, not intractable, without status migrainosus: Secondary | ICD-10-CM

## 2015-09-28 HISTORY — DX: Migraine, unspecified, not intractable, without status migrainosus: G43.909

## 2015-09-28 MED ORDER — TOPIRAMATE 25 MG PO TABS: ORAL_TABLET | ORAL | 3 refills | Status: DC

## 2015-09-28 NOTE — Progress Notes (Signed)
Reason for visit: Headache  Referring physician: Dr. Harley Alto Shannon Flores is a 65 y.o. female  History of present illness:  [name] is a 65 year old left-handed white female with a history of migraine headaches dating back many years. The patient indicates that she began having more frequent headaches since Jun 05, 1989 after her son died from prolonged QT interval syndrome. The patient indicates that her headaches have worsened over the last 2 or 3 months, she now is having 20 out of 30 days of the month with headache. The headaches are bitemporal in nature, going to the top of the head. They are associated with photophobia, phonophobia, nausea and vomiting. The patient reports some water-like vision in the right eye that is felt to be related to the headache. She denies any focal numbness or weakness with the headache, she does have some slight imbalance, she has fallen several times within the last several months. The patient did hit the back of her head on one occasion. In the past, she has had documented white matter abnormalities by MRI of the brain, she underwent a workup for demyelinating disease that was negative with a lumbar puncture. The patient has been set up for Botox injections, but she decided not to pursue this treatment for now. The patient is on a beta blocker, she has been on gabapentin, she has taken Topamax in the past. She is sent to this office for an evaluation.  Past Medical History:  Diagnosis Date  . Allergic rhinitis   . Anxiety   . Asthma   . Carpal tunnel syndrome   . Chronic pain syndrome   . Depression   . Diabetes mellitus without complication (HCC)   . Family history of long QT syndrome   . GERD (gastroesophageal reflux disease)   . Hyperlipidemia   . Hypertension   . IBS (irritable bowel syndrome)   . Migraine 09/28/2015  . Migraine headache   . Non-cystic lesion of iris   . Thyroid disease    hypothyroidism    Past Surgical History:    Procedure Laterality Date  . CARPAL TUNNEL RELEASE  04/2008  . COLPOSCOPY    . meniscal tear    . MENISCUS REPAIR     right knee   . MULTIPLE TOOTH EXTRACTIONS    . VAGINAL HYSTERECTOMY      Family History  Problem Relation Age of Onset  . Diabetes Father   . Hypertension Other   . Lung cancer    . Heart defect Son     Passed suddenly at age 82  . Other Mother     Phlebitis  . Migraines Mother   . Migraines Sister   . Migraines Brother   . Migraines Maternal Grandfather   . Migraines Sister     Social history:  reports that she has never smoked. She has never used smokeless tobacco. She reports that she does not drink alcohol or use drugs.  Medications:  Prior to Admission medications   Medication Sig Start Date End Date Taking? Authorizing Provider  albuterol (PROAIR HFA) 108 (90 Base) MCG/ACT inhaler Inhale 2 puffs into the lungs every 6 (six) hours as needed. 02/14/15   Historical Provider, MD  ALPRAZolam Prudy Feeler) 0.5 MG tablet Take 0.5 mg by mouth 3 (three) times daily as needed.      Historical Provider, MD  aspirin 81 MG EC tablet Take 81 mg by mouth daily.      Historical Provider, MD  atenolol (TENORMIN)  100 MG tablet Take 1.5 tablets by mouth daily. 12/04/14   Historical Provider, MD  buPROPion (WELLBUTRIN SR) 150 MG 12 hr tablet Take 300 mg by mouth daily. 150 mg in the evening    Historical Provider, MD  cetirizine (ZYRTEC) 10 MG tablet Take 10 mg by mouth daily. Can also take extra 1/2 tablet if needed    Historical Provider, MD  citalopram (CELEXA) 40 MG tablet Take 40 mg by mouth daily.      Historical Provider, MD  clotrimazole-betamethasone (LOTRISONE) cream Apply 1 application topically 2 (two) times daily.  07/10/15   Historical Provider, MD  conjugated estrogens (PREMARIN) vaginal cream Place 1 Applicatorful vaginally daily.  04/04/15   Historical Provider, MD  cyclobenzaprine (FLEXERIL) 10 MG tablet Take 10 mg by mouth 2 (two) times daily as needed.       Historical Provider, MD  gabapentin (NEURONTIN) 300 MG capsule Take 1 capsule (300 mg total) by mouth 3 (three) times daily. 09/25/15   Ankit Karis JubaAnil Patel, MD  HYDROcodone-acetaminophen (NORCO/VICODIN) 5-325 MG tablet Take 1 tablet by mouth 2 (two) times daily.    Historical Provider, MD  levothyroxine (SYNTHROID, LEVOTHROID) 50 MCG tablet Take 50 mcg by mouth daily.      Historical Provider, MD  metFORMIN (GLUCOPHAGE) 500 MG tablet Take 500 mg by mouth 2 (two) times daily. 07/10/15 07/09/16  Historical Provider, MD  Multiple Vitamin (MULTIVITAMIN) tablet Take 1 tablet by mouth daily.      Historical Provider, MD  oxymetazoline (12 HOUR NASAL SPRAY) 0.05 % nasal spray Place 1 spray into the nose daily as needed.    Historical Provider, MD  RABEprazole (ACIPHEX) 20 MG tablet Take 20 mg by mouth daily.      Historical Provider, MD  zolpidem (AMBIEN) 5 MG tablet Take 5 mg by mouth at bedtime as needed for sleep.    Historical Provider, MD      Allergies  Allergen Reactions  . Propoxyphene Other (See Comments)    "can't remember"  . Benadryl [Diphenhydramine Hcl] Hives  . Diflucan [Fluconazole, Injectable]   . Imitrex [Sumatriptan Base] Other (See Comments)    Tachycardia  . Iodine Hives    IV Dye, Iodine containing contrast media group   . Iohexol   . Naprelan [Naproxen Sodium]     rash  . Provera [Medroxyprogesterone Acetate]   . Prozac [Fluoxetine Hcl]   . Statins Other (See Comments)    unspecified  . Amoxicillin-Pot Clavulanate Rash  . Ceclor [Cefaclor] Rash  . Epinephrine Palpitations  . Erythromycin Rash  . Sulfa Antibiotics Rash  . Tetracyclines & Related Rash    ROS:  Out of a complete 14 system review of symptoms, the patient complains only of the following symptoms, and all other reviewed systems are negative.  Palpitations of the heart Blurred vision Wheezing Flushing Memory loss, headache, numbness, difficulty swallowing, dizziness Insomnia  Blood pressure 128/85,  pulse 68, height 5\' 7"  (1.702 m), weight 221 lb (100.2 kg).  Physical Exam  General: The patient is alert and cooperative at the time of the examination. The patient is moderately obese.  Eyes: Pupils are equal, round, and reactive to light. Discs are flat bilaterally.  Neck: The neck is supple, no carotid bruits are noted.  Respiratory: The respiratory examination is clear.  Cardiovascular: The cardiovascular examination reveals a regular rate and rhythm, no obvious murmurs or rubs are noted.  Skin: Extremities are without significant edema.  Neurologic Exam  Mental status: The patient is  alert and oriented x 3 at the time of the examination. The patient has apparent normal recent and remote memory, with an apparently normal attention span and concentration ability.  Cranial nerves: Facial symmetry is present. There is good sensation of the face to pinprick and soft touch bilaterally. The strength of the facial muscles and the muscles to head turning and shoulder shrug are normal bilaterally. Speech is well enunciated, no aphasia or dysarthria is noted. Extraocular movements are full. Visual fields are full. The tongue is midline, and the patient has symmetric elevation of the soft palate. No obvious hearing deficits are noted.  Motor: The motor testing reveals 5 over 5 strength of all 4 extremities. Good symmetric motor tone is noted throughout.  Sensory: Sensory testing is intact to pinprick, soft touch, vibration sensation, and position sense on all 4 extremities, with exception of some decrease in pinprick sensation, vibration sensation and position sense of the left foot. No evidence of extinction is noted.  Coordination: Cerebellar testing reveals good finger-nose-finger and heel-to-shin bilaterally.  Gait and station: Gait is normal. Tandem gait is slightly unsteady. Romberg is negative. No drift is seen.  Reflexes: Deep tendon reflexes are symmetric, but are depressed  bilaterally. Toes are downgoing bilaterally.   Assessment/Plan:  1. Intractable migraine headache  2. Abnormal MRI brain  The patient will be set up for MRI evaluation of the brain. The patient will be placed on Topamax for the headaches. The patient should be an excellent candidate for Botox treatments, we will consider this therapy option in the future. The patient will also be going to a pain center. She will follow-up here in 4 months.  Marlan Palau MD 09/29/2015 9:33 AM  [facility] [address] [place], [place] [address]  Phone [phone] Fax [phone]

## 2015-09-28 NOTE — Patient Instructions (Addendum)
 Topamax (topiramate) is a seizure medication that has an FDA approval for seizures and for migraine headache. Potential side effects of this medication include weight loss, cognitive slowing, tingling in the fingers and toes, and carbonated drinks will taste bad. If any significant side effects are noted on this drug, please contact our office.  Migraine Headache A migraine headache is an intense, throbbing pain on one or both sides of your head. A migraine can last for 30 minutes to several hours. CAUSES  The exact cause of a migraine headache is not always known. However, a migraine may be caused when nerves in the brain become irritated and release chemicals that cause inflammation. This causes pain. Certain things may also trigger migraines, such as:  Alcohol.  Smoking.  Stress.  Menstruation.  Aged cheeses.  Foods or drinks that contain nitrates, glutamate, aspartame, or tyramine.  Lack of sleep.  Chocolate.  Caffeine.  Hunger.  Physical exertion.  Fatigue.  Medicines used to treat chest pain (nitroglycerine), birth control pills, estrogen, and some blood pressure medicines. SIGNS AND SYMPTOMS  Pain on one or both sides of your head.  Pulsating or throbbing pain.  Severe pain that prevents daily activities.  Pain that is aggravated by any physical activity.  Nausea, vomiting, or both.  Dizziness.  Pain with exposure to bright lights, loud noises, or activity.  General sensitivity to bright lights, loud noises, or smells. Before you get a migraine, you may get warning signs that a migraine is coming (aura). An aura may include:  Seeing flashing lights.  Seeing bright spots, halos, or zigzag lines.  Having tunnel vision or blurred vision.  Having feelings of numbness or tingling.  Having trouble talking.  Having muscle weakness. DIAGNOSIS  A migraine headache is often diagnosed based on:  Symptoms.  Physical exam.  A CT scan or MRI of your  head. These imaging tests cannot diagnose migraines, but they can help rule out other causes of headaches. TREATMENT Medicines may be given for pain and nausea. Medicines can also be given to help prevent recurrent migraines.  HOME CARE INSTRUCTIONS  Only take over-the-counter or prescription medicines for pain or discomfort as directed by your health care provider. The use of long-term narcotics is not recommended.  Lie down in a dark, quiet room when you have a migraine.  Keep a journal to find out what may trigger your migraine headaches. For example, write down:  What you eat and drink.  How much sleep you get.  Any change to your diet or medicines.  Limit alcohol consumption.  Quit smoking if you smoke.  Get 7-9 hours of sleep, or as recommended by your health care provider.  Limit stress.  Keep lights dim if bright lights bother you and make your migraines worse. SEEK IMMEDIATE MEDICAL CARE IF:   Your migraine becomes severe.  You have a fever.  You have a stiff neck.  You have vision loss.  You have muscular weakness or loss of muscle control.  You start losing your balance or have trouble walking.  You feel faint or pass out.  You have severe symptoms that are different from your first symptoms. MAKE SURE YOU:   Understand these instructions.  Will watch your condition.  Will get help right away if you are not doing well or get worse.   This information is not intended to replace advice given to you by your health care provider. Make sure you discuss any questions you have with your   health care provider.   Document Released: 01/06/2005 Document Revised: 01/27/2014 Document Reviewed: 09/13/2012 Elsevier Interactive Patient Education 2016 [employer]  

## 2015-10-12 ENCOUNTER — Other Ambulatory Visit: Payer: Medicare Other

## 2015-10-17 ENCOUNTER — Ambulatory Visit
Admission: RE | Admit: 2015-10-17 | Discharge: 2015-10-17 | Disposition: A | Discharge status: Home / Self Care | Payer: Medicare Other | Source: Ambulatory Visit | Attending: Neurology | Admitting: Neurology

## 2015-10-17 DIAGNOSIS — G43711 Chronic migraine without aura, intractable, with status migrainosus: Secondary | ICD-10-CM | POA: Diagnosis not present

## 2015-10-19 ENCOUNTER — Telehealth: Payer: Self-pay | Admitting: Neurology

## 2015-10-19 NOTE — Telephone Encounter (Signed)
  I called patient. The patient has had definite progression of white matter changes, she now has a moderate level of periventricular and brainstem chronic ischemic changes. The patient does have diabetes and hypertension. Last comparison study was 12 years ago, a significant change is noted since then. The patient is on low-dose aspirin.  MRI brain 10/18/15:  IMPRESSION:  Abnormal MRI brain (without) demonstrating: 1. Moderate periventricular and subcortical and pontine chronic small vessel ischemic disease. 2. Moderate perisylvian atrophy. 3. No acute findings. 4. Compared to MRI on 02/23/03 there has been progression of atrophy and chronic small vessel ischemic disease.

## 2016-01-30 ENCOUNTER — Ambulatory Visit: Payer: Medicare Other | Admitting: Neurology

## 2016-02-12 ENCOUNTER — Other Ambulatory Visit: Payer: Self-pay | Admitting: Rheumatology

## 2016-02-13 ENCOUNTER — Telehealth: Payer: Self-pay | Admitting: Rheumatology

## 2016-02-13 NOTE — Telephone Encounter (Signed)
-----   Message from Henriette CombsAndrea L Hatton, LPN sent at 1/61/09601/24/2018 11:07 AM EST ----- Regarding: Please schedule patient for follow up visit Please schedule patient for follow up visit. Patient due March 2018. Thanks!

## 2016-02-13 NOTE — Telephone Encounter (Signed)
Last Visit: 09/27/15 Next Visit due March 2018 and message sent to the front to schedule patient  Okay to refill Cyclobenzaprine?

## 2016-02-13 NOTE — Telephone Encounter (Signed)
LMOM for patient to call back to schedule appointment.  

## 2016-03-24 ENCOUNTER — Telehealth: Payer: Self-pay | Admitting: *Deleted

## 2016-03-24 NOTE — Telephone Encounter (Signed)
Called and LVM for pt to call if she can come tomorrow at 12pm check in 1145pm. There is an opening. Asked her to call first thing in the am to let me know.

## 2016-03-25 NOTE — Telephone Encounter (Deleted)
If patient calls, slot not available anymore. CW,MD fit in another patient urgently.

## 2016-04-02 ENCOUNTER — Telehealth: Payer: Self-pay | Admitting: *Deleted

## 2016-04-02 NOTE — Telephone Encounter (Signed)
Called and LVM for pt. She is on wait list to come in sooner. Asked her to call back.

## 2016-04-11 DIAGNOSIS — Z8669 Personal history of other diseases of the nervous system and sense organs: Secondary | ICD-10-CM | POA: Insufficient documentation

## 2016-04-11 DIAGNOSIS — M1990 Unspecified osteoarthritis, unspecified site: Secondary | ICD-10-CM | POA: Insufficient documentation

## 2016-04-11 DIAGNOSIS — M797 Fibromyalgia: Secondary | ICD-10-CM | POA: Insufficient documentation

## 2016-04-11 DIAGNOSIS — M170 Bilateral primary osteoarthritis of knee: Secondary | ICD-10-CM | POA: Insufficient documentation

## 2016-04-11 DIAGNOSIS — E559 Vitamin D deficiency, unspecified: Secondary | ICD-10-CM | POA: Insufficient documentation

## 2016-04-11 DIAGNOSIS — Z8709 Personal history of other diseases of the respiratory system: Secondary | ICD-10-CM | POA: Insufficient documentation

## 2016-04-11 NOTE — Progress Notes (Deleted)
Office Visit Note  Patient: Shannon Flores             Date of Birth: 1950-11-10           MRN: [id number]             PCP: Lynnea Ferrier, MD Referring: Lynnea Ferrier, MD Visit Date: 04/15/2016 Occupation: @GUAROCC @    Subjective:  No chief complaint on file.   History of Present Illness: Shannon Flores is a 66 y.o. female ***   Activities of Daily Living:  Patient reports morning stiffness for *** {minute/hour:19697}.   Patient {ACTIONS;DENIES/REPORTS:21021675::"Denies"} nocturnal pain.  Difficulty dressing/grooming: {ACTIONS;DENIES/REPORTS:21021675::"Denies"} Difficulty climbing stairs: {ACTIONS;DENIES/REPORTS:21021675::"Denies"} Difficulty getting out of chair: {ACTIONS;DENIES/REPORTS:21021675::"Denies"} Difficulty using hands for taps, buttons, cutlery, and/or writing: {ACTIONS;DENIES/REPORTS:21021675::"Denies"}   No Rheumatology ROS completed.   PMFS History:  Patient Active Problem List   Diagnosis Date Noted  . Inflammatory arthritis 04/11/2016  . Fibromyalgia 04/11/2016  . Vitamin D deficiency 04/11/2016  . Primary osteoarthritis of both knees 04/11/2016  . History of asthma 04/11/2016  . History of migraine 04/11/2016  . Migraine 09/28/2015  . Asthma without status asthmaticus 08/29/2015  . Chronic pain syndrome 08/29/2015  . DDD (degenerative disc disease), lumbar 02/14/2015  . Ataxia 03/20/2014  . Abnormal electrocardiogram-QT prolongation 06/24/2010  . Sinus tachycardia 06/24/2010    Past Medical History:  Diagnosis Date  . Allergic rhinitis   . Anxiety   . Asthma   . Carpal tunnel syndrome   . Chronic pain syndrome   . Depression   . Diabetes mellitus without complication (HCC)   . Family history of long QT syndrome   . GERD (gastroesophageal reflux disease)   . Hyperlipidemia   . Hypertension   . IBS (irritable bowel syndrome)   . Migraine 09/28/2015  . Migraine headache   . Non-cystic lesion of iris   . Thyroid disease    hypothyroidism    Family History  Problem Relation Age of Onset  . Diabetes Father   . Hypertension Other   . Lung cancer    . Heart defect Son     Passed suddenly at age 49  . Other Mother     Phlebitis  . Migraines Mother   . Migraines Sister   . Migraines Brother   . Migraines Maternal Grandfather   . Migraines Sister    Past Surgical History:  Procedure Laterality Date  . CARPAL TUNNEL RELEASE  04/2008  . COLPOSCOPY    . meniscal tear    . MENISCUS REPAIR     right knee   . MULTIPLE TOOTH EXTRACTIONS    . VAGINAL HYSTERECTOMY     Social History   Social History Narrative   Lives at home   Left-handed   Caffeine: 2 glasses of tea per day     Objective: Vital Signs: There were no vitals taken for this visit.   Physical Exam   Musculoskeletal Exam: ***  CDAI Exam: No CDAI exam completed.    Investigation: Findings:  /UDS positive for Hydrocodone and Alprazolam 12/28/2014   06/15/2013 ultrasound of her right hand as she was having increased pain and discomfort for comparison.  After informed consent was obtained, per EULAR recommendation, ultrasound examination of right hand was obtained.  Using Grayscale, 12 megahertz transducer and power Doppler, right 2nd, 3rd and 5th MCP joints and right 2nd and 3rd PIP joints and wrist joints, both dorsal and volar aspects, were evaluated.  The findings were that  she had synovitis in her right 2nd MCP joint and also in her right wrist joint, which was mild synovitis.  The 3rd and 5th MCP joints did not show any synovitis.  There was mild synovitis in her right 2nd PIP joint.  There was narrowing of the PIP joints also.  Right median nerve was 0.12 cm2, which was more than upper limits of normal.        Imaging: No results found.  Speciality Comments: No specialty comments available.    Procedures:  No procedures performed Allergies: Propoxyphene; Benadryl [diphenhydramine hcl]; Diflucan [fluconazole, injectable];  Imitrex [sumatriptan base]; Iodine; Iohexol; Naprelan [naproxen sodium]; Provera [medroxyprogesterone acetate]; Prozac [fluoxetine hcl]; Statins; Amoxicillin-pot clavulanate; Ceclor [cefaclor]; Epinephrine; Erythromycin; Sulfa antibiotics; and Tetracyclines & related   Assessment / Plan:     Visit Diagnoses: Inflammatory arthritis  Fibromyalgia  DJD (degenerative joint disease), cervical  DDD (degenerative disc disease), lumbar  Primary osteoarthritis of both knees  Primary osteoarthritis of both hands  Vitamin D deficiency  History of migraine  History of asthma  History of hypertension  History of hypothyroidism  History of anxiety  History of prolonged Q-T interval on ECG   FU with Lisabeth RegisterHans Orders: No orders of the defined types were placed in this encounter.  No orders of the defined types were placed in this encounter.   Face-to-face time spent with patient was *** minutes. 50% of time was spent in counseling and coordination of care.  Follow-Up Instructions: No Follow-up on file.   Pollyann SavoyShaili Cristyn Crossno, MD  Note - This record has been created using [employer] software.  Chart creation errors have been sought, but may not always  have been located. Such creation errors do not reflect on  the standard of medical care.

## 2016-04-15 ENCOUNTER — Ambulatory Visit: Payer: Medicare Other | Admitting: Rheumatology

## 2016-04-23 DIAGNOSIS — Z8639 Personal history of other endocrine, nutritional and metabolic disease: Secondary | ICD-10-CM | POA: Insufficient documentation

## 2016-04-23 DIAGNOSIS — Z8679 Personal history of other diseases of the circulatory system: Secondary | ICD-10-CM | POA: Insufficient documentation

## 2016-04-23 DIAGNOSIS — M19042 Primary osteoarthritis, left hand: Secondary | ICD-10-CM

## 2016-04-23 DIAGNOSIS — Z8709 Personal history of other diseases of the respiratory system: Secondary | ICD-10-CM | POA: Insufficient documentation

## 2016-04-23 DIAGNOSIS — G5602 Carpal tunnel syndrome, left upper limb: Secondary | ICD-10-CM | POA: Insufficient documentation

## 2016-04-23 DIAGNOSIS — Z8659 Personal history of other mental and behavioral disorders: Secondary | ICD-10-CM | POA: Insufficient documentation

## 2016-04-23 DIAGNOSIS — Z8739 Personal history of other diseases of the musculoskeletal system and connective tissue: Secondary | ICD-10-CM | POA: Insufficient documentation

## 2016-04-23 DIAGNOSIS — M5030 Other cervical disc degeneration, unspecified cervical region: Secondary | ICD-10-CM | POA: Insufficient documentation

## 2016-04-23 DIAGNOSIS — Z8719 Personal history of other diseases of the digestive system: Secondary | ICD-10-CM | POA: Insufficient documentation

## 2016-04-23 DIAGNOSIS — M19041 Primary osteoarthritis, right hand: Secondary | ICD-10-CM | POA: Insufficient documentation

## 2016-04-23 DIAGNOSIS — Z79899 Other long term (current) drug therapy: Secondary | ICD-10-CM | POA: Insufficient documentation

## 2016-04-23 DIAGNOSIS — M797 Fibromyalgia: Secondary | ICD-10-CM | POA: Insufficient documentation

## 2016-04-23 NOTE — Progress Notes (Deleted)
Office Visit Note  Patient: Shannon Flores             Date of Birth: [birth year]           MRN: [id number]             PCP: Lynnea Ferrier, MD Referring: Lynnea Ferrier, MD Visit Date: 04/25/2016 Occupation: @    Subjective:  No chief complaint on file.   History of Present Illness: Shannon Flores is a 66 y.o. female ***   Activities of Daily Living:  Patient reports morning stiffness for *** {minute/hour:19697}.   Patient {ACTIONS;DENIES/REPORTS:21021675::"Denies"} nocturnal pain.  Difficulty dressing/grooming: {ACTIONS;DENIES/REPORTS:21021675::"Denies"} Difficulty climbing stairs: {ACTIONS;DENIES/REPORTS:21021675::"Denies"} Difficulty getting out of chair: {ACTIONS;DENIES/REPORTS:21021675::"Denies"} Difficulty using hands for taps, buttons, cutlery, and/or writing: {ACTIONS;DENIES/REPORTS:21021675::"Denies"}   No Rheumatology ROS completed.   PMFS History:  Patient Active Problem List   Diagnosis Date Noted  . Inflammatory arthritis 04/11/2016  . Fibromyalgia 04/11/2016  . Vitamin D deficiency 04/11/2016  . Primary osteoarthritis of both knees 04/11/2016  . History of asthma 04/11/2016  . History of migraine 04/11/2016  . Migraine 09/28/2015  . Asthma without status asthmaticus 08/29/2015  . Chronic pain syndrome 08/29/2015  . DDD (degenerative disc disease), lumbar 02/14/2015  . Ataxia 03/20/2014  . Abnormal electrocardiogram-QT prolongation 06/24/2010  . Sinus tachycardia 06/24/2010    Past Medical History:  Diagnosis Date  . Allergic rhinitis   . Anxiety   . Asthma   . Carpal tunnel syndrome   . Chronic pain syndrome   . Depression   . Diabetes mellitus without complication (HCC)   . Family history of long QT syndrome   . GERD (gastroesophageal reflux disease)   . Hyperlipidemia   . Hypertension   . IBS (irritable bowel syndrome)   . Migraine 09/28/2015  . Migraine headache   . Non-cystic lesion of iris   . Thyroid disease    hypothyroidism    Family History  Problem Relation Age of Onset  . Diabetes Father   . Hypertension Other   . Lung cancer    . Heart defect Son     Passed suddenly at age 73  . Other Mother     Phlebitis  . Migraines Mother   . Migraines Sister   . Migraines Brother   . Migraines Maternal Grandfather   . Migraines Sister    Past Surgical History:  Procedure Laterality Date  . CARPAL TUNNEL RELEASE  04/2008  . COLPOSCOPY    . meniscal tear    . MENISCUS REPAIR     right knee   . MULTIPLE TOOTH EXTRACTIONS    . VAGINAL HYSTERECTOMY     Social History   Social History Narrative   Lives at home   Left-handed   Caffeine: 2 glasses of tea per day     Objective: Vital Signs: There were no vitals taken for this visit.   Physical Exam   Musculoskeletal Exam: ***  CDAI Exam: No CDAI exam completed.    Investigation: Findings:  .  December 2016:  Urine drug screen was positive for hydrocodone use.   2.  We did obtain x-ray of bilateral knee joints, 3 views today, which showed bilateral moderate medial compartment narrowing, which is moderate to severe and has progressed over time.  She has bilateral moderate patellofemoral narrowing consistent with chondromalacia without any chondrocalcinosis.   No visits with results within 6 Month(s) from this visit.  Latest known visit with results is:  Office  Visit on 06/13/2015  Component Date Value Ref Range Status  . ToxASSURE Select (Plus) 06/13/2015 FINAL   Final   Comment: ==================================================================== TOXASSURE SELECT,+ANTIDEPR,UR ==================================================================== Test                             Result       Flag       Units Drug Present and Declared for Prescription Verification   Alprazolam                     138          EXPECTED   ng/mg creat   Alpha-hydroxyalprazolam        116          EXPECTED   ng/mg creat    Source of alprazolam is a  scheduled prescription medication.    Alpha-hydroxyalprazolam is an expected metabolite of alprazolam.   Hydrocodone                    2343         EXPECTED   ng/mg creat   Dihydrocodeine                 117          EXPECTED   ng/mg creat   Norhydrocodone                 1527         EXPECTED   ng/mg creat    Sources of hydrocodone include scheduled prescription    medications. Dihydrocodeine and norhydrocodone are expected    metabolites of hydrocodone. Dihydrocodeine is also available as a    scheduled prescription medica                          tion. Drug Present not Declared for Prescription Verification   Cyclobenzaprine                PRESENT      UNEXPECTED   Citalopram                     PRESENT      UNEXPECTED   Desmethylcitalopram            PRESENT      UNEXPECTED    Desmethylcitalopram is an expected metabolite of citalopram or    the enantiomeric form, escitalopram. Drug Absent but Declared for Prescription Verification   Zolpidem                       Not Detected UNEXPECTED    Zolpidem, as indicated in the declared medication list, is not    always detected even when used as directed. ==================================================================== Test                      Result    Flag   Units      Ref Range   Creatinine              173              mg/dL      >=16 ==================================================================== Declared Medications:  The flagging and interpretation on this report are based on the  following declared medications.  Unexpected results may arise from  i  naccuracies in the declared medications.  **Note: The testing scope of this panel includes these medications:  Alprazolam (Xanax)  Hydrocodone  **Note: The testing scope of this panel does not include small to  moderate amounts of these reported medications:  Zolpidem  (Ambien) ==================================================================== For clinical consultation, please call [phone]. ====================================================================       Imaging: No results found.  Speciality Comments: No specialty comments available.    Procedures:  No procedures performed Allergies: Propoxyphene; Benadryl [diphenhydramine hcl]; Diflucan [fluconazole, injectable]; Imitrex [sumatriptan base]; Iodine; Iohexol; Naprelan [naproxen sodium]; Provera [medroxyprogesterone acetate]; Prozac [fluoxetine hcl]; Statins; Amoxicillin-pot clavulanate; Ceclor [cefaclor]; Epinephrine; Erythromycin; Sulfa antibiotics; and Tetracyclines & related   Assessment / Plan:     Visit Diagnoses: No diagnosis found.    Orders: No orders of the defined types were placed in this encounter.  No orders of the defined types were placed in this encounter.   Face-to-face time spent with patient was *** minutes. 50% of time was spent in counseling and coordination of care.  Follow-Up Instructions: No Follow-up on file.   Conlan Miceli, [place]  Note - This record has been created using [employer].  Chart creation errors have been sought, but may not always  have been located. Such creation errors do not reflect on  the standard of medical care.

## 2016-04-25 ENCOUNTER — Ambulatory Visit: Payer: Medicare Other | Admitting: Rheumatology

## 2016-05-01 NOTE — Progress Notes (Signed)
Office Visit Note  Patient: Shannon Flores             Date of Birth: 09-Dec-1950           MRN: [id number]             PCP: Lynnea Ferrier, MD Referring: Lynnea Ferrier, MD Visit Date: 05/15/2016 Occupation: @    Subjective:  Pain hands   History of Present Illness: Shannon Flores is a 66 y.o. female with history of osteoarthritis fibromyalgia and this disease. She states she's been having pain and discomfort in her bilateral hands. She's been wearing a brace on her right hand which is causing some discomfort. She is some discomfort in her neck and lower back. She continues to have some discomfort in her left knee joint. She has not gotten the brace yet. She believes of fibromyalgia in general is doing better.  Activities of Daily Living:  Patient reports morning stiffness for 1 hour.   Patient Reports nocturnal pain.  Difficulty dressing/grooming: Denies Difficulty climbing stairs: Reports Difficulty getting out of chair: Reports Difficulty using hands for taps, buttons, cutlery, and/or writing: Reports   Review of Systems  Constitutional: Positive for fatigue. Negative for night sweats, weight gain, weight loss and weakness.  HENT: Positive for mouth dryness. Negative for mouth sores, trouble swallowing, trouble swallowing and nose dryness.   Eyes: Negative for pain, redness, visual disturbance and dryness.  Respiratory: Negative for cough, shortness of breath and difficulty breathing.   Cardiovascular: Negative for chest pain, palpitations, hypertension, irregular heartbeat and swelling in legs/feet.  Gastrointestinal: Negative for blood in stool, constipation and diarrhea.  Endocrine: Negative for increased urination.  Genitourinary: Negative for vaginal dryness.  Musculoskeletal: Positive for arthralgias, joint pain, myalgias, morning stiffness and myalgias. Negative for joint swelling, muscle weakness and muscle tenderness.  Skin: Negative for color  change, rash, hair loss, skin tightness, ulcers and sensitivity to sunlight.  Allergic/Immunologic: Negative for susceptible to infections.  Neurological: Negative for dizziness, memory loss and night sweats.  Hematological: Negative for swollen glands.  Psychiatric/Behavioral: Positive for sleep disturbance. Negative for depressed mood. The patient is not nervous/anxious.     PMFS History:  Patient Active Problem List   Diagnosis Date Noted  . History of seronegative inflammatory arthritis 04/23/2016  . Fibromyalgia syndrome 04/23/2016  . DDD (degenerative disc disease), cervical 04/23/2016  . Carpal tunnel syndrome on left 04/23/2016  . Primary osteoarthritis of both hands 04/23/2016  . History of hypertension 04/23/2016  . History of hypothyroidism 04/23/2016  . History of gastroesophageal reflux (GERD) 04/23/2016  . History of anxiety 04/23/2016  . History of allergic rhinitis 04/23/2016  . History of diabetes mellitus 04/23/2016  . Fibromyalgia 04/11/2016  . Vitamin D deficiency 04/11/2016  . Primary osteoarthritis of both knees 04/11/2016  . History of asthma 04/11/2016  . History of migraine 04/11/2016  . Migraine 09/28/2015  . Asthma without status asthmaticus 08/29/2015  . Chronic pain syndrome 08/29/2015  . DDD (degenerative disc disease), lumbar 02/14/2015  . Ataxia 03/20/2014  . Abnormal electrocardiogram-QT prolongation 06/24/2010  . Sinus tachycardia 06/24/2010    Past Medical History:  Diagnosis Date  . Allergic rhinitis   . Anxiety   . Asthma   . Carpal tunnel syndrome   . Chronic pain syndrome   . Depression   . Diabetes mellitus without complication (HCC)   . Family history of long QT syndrome   . GERD (gastroesophageal reflux disease)   .  Hyperlipidemia   . Hypertension   . IBS (irritable bowel syndrome)   . Migraine 09/28/2015  . Migraine headache   . Non-cystic lesion of iris   . Thyroid disease    hypothyroidism    Family History  Problem  Relation Age of Onset  . Diabetes Father   . Hypertension Other   . Lung cancer    . Heart defect Son     Passed suddenly at age 47  . Other Mother     Phlebitis  . Migraines Mother   . Migraines Sister   . Migraines Brother   . Migraines Maternal Grandfather   . Migraines Sister    Past Surgical History:  Procedure Laterality Date  . CARPAL TUNNEL RELEASE  04/2008  . COLPOSCOPY    . meniscal tear    . MENISCUS REPAIR     right knee   . MULTIPLE TOOTH EXTRACTIONS    . VAGINAL HYSTERECTOMY     Social History   Social History Narrative   Lives at home   Left-handed   Caffeine: 2 glasses of tea per day     Objective: Vital Signs: BP 111/69 (BP Location: Left Arm, Patient Position: Sitting, Cuff Size: Normal)   Pulse 98   Resp 15   Ht  (1.702 m)   Wt 222 lb (100.7 kg)   BMI 34.77 kg/m    Physical Exam  Constitutional: She is oriented to person, place, and time. She appears well-developed and well-nourished.  HENT:  Head: Normocephalic and atraumatic.  Eyes: Conjunctivae and EOM are normal.  Neck: Normal range of motion.  Cardiovascular: Normal rate, regular rhythm, normal heart sounds and intact distal pulses.   Pulmonary/Chest: Effort normal and breath sounds normal.  Abdominal: Soft. Bowel sounds are normal.  Lymphadenopathy:    She has no cervical adenopathy.  Neurological: She is alert and oriented to person, place, and time.  Skin: Skin is warm and dry. Capillary refill takes less than 2 seconds.  Psychiatric: She has a normal mood and affect. Her behavior is normal.  Nursing note and vitals reviewed.    Musculoskeletal Exam: C-spine good range of motion she has limited range of motion of thoracic lumbar spine discomfort. Shoulder joints elbow joints are good range of motion. Wrists joints are good range of motion. She has DIP PIP thickening bilaterally in her hands with postsurgical changes in her right third PIP joint and limitation of range of  motion. Hip joints are good range of motion knee joints ankle joints are good range of motion. She has bilateral bunions with overcrowding of her toes. She also has some calluses on her feet.  CDAI Exam: No CDAI exam completed.    Investigation: Findings:  03/09/2015 We did obtain x-ray of bilateral knee joints, 3 views today, which showed bilateral moderate medial compartment narrowing, which is moderate to severe and has progressed over time.  She has bilateral moderate patellofemoral narrowing consistent with chondromalacia without any chondrocalcinosis.        Imaging: No results found.  Speciality Comments: No specialty comments available.    Procedures:  No procedures performed Allergies: Propoxyphene; Benadryl [diphenhydramine hcl]; Diflucan [fluconazole, injectable]; Imitrex [sumatriptan base]; Iodine; Iohexol; Metformin; Naprelan [naproxen sodium]; Provera [medroxyprogesterone acetate]; Prozac [fluoxetine hcl]; Statins; Amoxicillin-pot clavulanate; Ceclor [cefaclor]; Epinephrine; Erythromycin; Sulfa antibiotics; and Tetracyclines & related   Assessment / Plan:     Visit Diagnoses: Primary osteoarthritis of both hands: She continues to have some stiffness and pain in her bilateral  hands and decreased range of motion. She's also complaining of paresthesias in her hands. I'll refer her to hand surgery for evaluation she's had surgery on her right fifth PIP joint in the past which is a still painful.  Primary osteoarthritis of both knees: She continues to have pain and discomfort.  Osteoarthritis feet. She has overcrowding of toes. After given her prescription for right foot toe separators that will be helpful. I've also advised to make an appointment with podiatrist as she has multiple calluses.  DDD (degenerative disc disease), cervical: She is chronic pain  DDD (degenerative disc disease), lumbar: Chronic pain  Fibromyalgia syndrome: She reports of fibromyalgia in general  is doing better.  Chronic pain syndrome: She is on medications for that.  History of hypothyroidism  Vitamin D deficiency  History of hypertension  History of migraine  History of gastroesophageal reflux (GERD)  History of diabetes mellitus  History of asthma  History of anxiety  History of allergic rhinitis  Abnormal electrocardiogram-QT prolongation    Orders: Orders Placed This Encounter  Procedures  . Ambulatory referral to Hand Surgery   No orders of the defined types were placed in this encounter.   Face-to-face time spent with patient was 30 minutes. 50% of time was spent in counseling and coordination of care.  Follow-Up Instructions: Return in about 8 months (around 01/14/2017) for OA, DDD, FMS.   [name], MD  Note - This record has been created using [employer].  Chart creation errors have been sought, but may not always  have been located. Such creation errors do not reflect on  the standard of medical care.

## 2016-05-12 ENCOUNTER — Ambulatory Visit: Payer: Medicare Other | Admitting: Rheumatology

## 2016-05-15 ENCOUNTER — Encounter: Payer: Self-pay | Admitting: Rheumatology

## 2016-05-15 ENCOUNTER — Ambulatory Visit (INDEPENDENT_AMBULATORY_CARE_PROVIDER_SITE_OTHER): Payer: Medicare Other | Admitting: Rheumatology

## 2016-05-15 VITALS — BP 111/69 | HR 98 | Resp 15 | Ht 67.0 in | Wt 222.0 lb

## 2016-05-15 DIAGNOSIS — M19042 Primary osteoarthritis, left hand: Secondary | ICD-10-CM

## 2016-05-15 DIAGNOSIS — Z8709 Personal history of other diseases of the respiratory system: Secondary | ICD-10-CM | POA: Diagnosis not present

## 2016-05-15 DIAGNOSIS — G894 Chronic pain syndrome: Secondary | ICD-10-CM

## 2016-05-15 DIAGNOSIS — M170 Bilateral primary osteoarthritis of knee: Secondary | ICD-10-CM

## 2016-05-15 DIAGNOSIS — E559 Vitamin D deficiency, unspecified: Secondary | ICD-10-CM

## 2016-05-15 DIAGNOSIS — Z8639 Personal history of other endocrine, nutritional and metabolic disease: Secondary | ICD-10-CM

## 2016-05-15 DIAGNOSIS — Z8679 Personal history of other diseases of the circulatory system: Secondary | ICD-10-CM

## 2016-05-15 DIAGNOSIS — M5136 Other intervertebral disc degeneration, lumbar region: Secondary | ICD-10-CM

## 2016-05-15 DIAGNOSIS — M19072 Primary osteoarthritis, left ankle and foot: Secondary | ICD-10-CM

## 2016-05-15 DIAGNOSIS — M19041 Primary osteoarthritis, right hand: Secondary | ICD-10-CM | POA: Diagnosis not present

## 2016-05-15 DIAGNOSIS — M797 Fibromyalgia: Secondary | ICD-10-CM

## 2016-05-15 DIAGNOSIS — Z8669 Personal history of other diseases of the nervous system and sense organs: Secondary | ICD-10-CM

## 2016-05-15 DIAGNOSIS — Z8659 Personal history of other mental and behavioral disorders: Secondary | ICD-10-CM

## 2016-05-15 DIAGNOSIS — M5030 Other cervical disc degeneration, unspecified cervical region: Secondary | ICD-10-CM

## 2016-05-15 DIAGNOSIS — Z8719 Personal history of other diseases of the digestive system: Secondary | ICD-10-CM

## 2016-05-15 DIAGNOSIS — R9431 Abnormal electrocardiogram [ECG] [EKG]: Secondary | ICD-10-CM

## 2016-05-15 DIAGNOSIS — M19071 Primary osteoarthritis, right ankle and foot: Secondary | ICD-10-CM

## 2016-06-19 ENCOUNTER — Telehealth: Payer: Self-pay | Admitting: Neurology

## 2016-06-19 NOTE — Telephone Encounter (Signed)
Called pt. R/s her appt to 08/15/16 at 12pm, check in 1130am. CW,MD ok wit this. Pt verbalized understanding and appreciation for call. Appt tomorrow was cx.

## 2016-06-19 NOTE — Telephone Encounter (Signed)
Pt said she had a fall in the tub last night and fx rt wrist and cannot come to appt tomorrow 6/1. She can't drive. She can come 7/27 at noon. Can RN schedule this appt, it is a 15 min slot. I don't have an option to schedule.

## 2016-06-20 ENCOUNTER — Ambulatory Visit: Payer: Medicare Other | Admitting: Neurology

## 2016-07-07 ENCOUNTER — Other Ambulatory Visit: Payer: Self-pay | Admitting: Rheumatology

## 2016-07-07 NOTE — Telephone Encounter (Signed)
Last Visit: 05/15/16 Next Visit: 01/15/17  Okay to refill Baclofen?

## 2016-08-15 ENCOUNTER — Ambulatory Visit: Payer: Self-pay | Admitting: Neurology

## 2016-09-02 ENCOUNTER — Other Ambulatory Visit: Payer: Self-pay | Admitting: Rheumatology

## 2016-09-03 NOTE — Telephone Encounter (Signed)
05/15/16 last visit  01/15/17 next visit  Ok to refill per Dr Corliss Skainseveshwar

## 2016-09-04 ENCOUNTER — Other Ambulatory Visit: Payer: Self-pay | Admitting: Internal Medicine

## 2016-09-04 DIAGNOSIS — R34 Anuria and oliguria: Secondary | ICD-10-CM

## 2016-09-10 ENCOUNTER — Ambulatory Visit
Admission: RE | Admit: 2016-09-10 | Discharge: 2016-09-10 | Disposition: A | Discharge status: Home / Self Care | Payer: Medicare Other | Source: Ambulatory Visit | Attending: Internal Medicine | Admitting: Internal Medicine

## 2016-09-10 DIAGNOSIS — R34 Anuria and oliguria: Secondary | ICD-10-CM

## 2016-10-29 ENCOUNTER — Other Ambulatory Visit: Payer: Self-pay | Admitting: Rheumatology

## 2016-10-29 NOTE — Telephone Encounter (Signed)
05/15/16 last visit  01/15/17 next visit  Ok to refill per Dr Deveshwar  

## 2017-01-08 NOTE — Progress Notes (Deleted)
Office Visit Note  Patient: Shannon Flores             Date of Birth: 1950-07-26           MRN: [id number]             PCP: Lynnea FerrierKlein, Bert J III, MD Referring: Lynnea FerrierKlein, Bert J III, MD Visit Date: 01/19/2017 Occupation: @GUAROCC @    Subjective:  No chief complaint on file.   History of Present Illness: Shannon Flores is a 66 y.o. female ***   Activities of Daily Living:  Patient reports morning stiffness for *** {minute/hour:19697}.   Patient {ACTIONS;DENIES/REPORTS:21021675::"Denies"} nocturnal pain.  Difficulty dressing/grooming: {ACTIONS;DENIES/REPORTS:21021675::"Denies"} Difficulty climbing stairs: {ACTIONS;DENIES/REPORTS:21021675::"Denies"} Difficulty getting out of chair: {ACTIONS;DENIES/REPORTS:21021675::"Denies"} Difficulty using hands for taps, buttons, cutlery, and/or writing: {ACTIONS;DENIES/REPORTS:21021675::"Denies"}   No Rheumatology ROS completed.   PMFS History:  Patient Active Problem List   Diagnosis Date Noted  . History of seronegative inflammatory arthritis 04/23/2016  . Fibromyalgia syndrome 04/23/2016  . DDD (degenerative disc disease), cervical 04/23/2016  . Carpal tunnel syndrome on left 04/23/2016  . Primary osteoarthritis of both hands 04/23/2016  . History of hypertension 04/23/2016  . History of hypothyroidism 04/23/2016  . History of gastroesophageal reflux (GERD) 04/23/2016  . History of anxiety 04/23/2016  . History of allergic rhinitis 04/23/2016  . History of diabetes mellitus 04/23/2016  . Fibromyalgia 04/11/2016  . Vitamin D deficiency 04/11/2016  . Primary osteoarthritis of both knees 04/11/2016  . History of asthma 04/11/2016  . History of migraine 04/11/2016  . Migraine 09/28/2015  . Asthma without status asthmaticus 08/29/2015  . Chronic pain syndrome 08/29/2015  . DDD (degenerative disc disease), lumbar 02/14/2015  . Ataxia 03/20/2014  . Abnormal electrocardiogram-QT prolongation 06/24/2010  . Sinus tachycardia 06/24/2010      Past Medical History:  Diagnosis Date  . Allergic rhinitis   . Anxiety   . Asthma   . Carpal tunnel syndrome   . Chronic pain syndrome   . Depression   . Diabetes mellitus without complication (HCC)   . Family history of long QT syndrome   . GERD (gastroesophageal reflux disease)   . Hyperlipidemia   . Hypertension   . IBS (irritable bowel syndrome)   . Migraine 09/28/2015  . Migraine headache   . Non-cystic lesion of iris   . Thyroid disease    hypothyroidism    Family History  Problem Relation Age of Onset  . Diabetes Father   . Hypertension Other   . Lung cancer Unknown   . Heart defect Son        Passed suddenly at age 68  . Other Mother        Phlebitis  . Migraines Mother   . Migraines Sister   . Migraines Brother   . Migraines Maternal Grandfather   . Migraines Sister    Past Surgical History:  Procedure Laterality Date  . CARPAL TUNNEL RELEASE  04/2008  . COLPOSCOPY    . meniscal tear    . MENISCUS REPAIR     right knee   . MULTIPLE TOOTH EXTRACTIONS    . VAGINAL HYSTERECTOMY     Social History   Social History Narrative   Lives at home   Left-handed   Caffeine: 2 glasses of tea per day     Objective: Vital Signs: There were no vitals taken for this visit.   Physical Exam   Musculoskeletal Exam: ***  CDAI Exam: No CDAI exam completed.    Investigation: No  additional findings.   Imaging: No results found.  Speciality Comments: No specialty comments available.    Procedures:  No procedures performed Allergies: Propoxyphene; Benadryl [diphenhydramine hcl]; Diflucan [fluconazole, injectable]; Imitrex [sumatriptan base]; Iodine; Iohexol; Metformin; Naprelan [naproxen sodium]; Provera [medroxyprogesterone acetate]; Prozac [fluoxetine hcl]; Statins; Amoxicillin-pot clavulanate; Ceclor [cefaclor]; Epinephrine; Erythromycin; Sulfa antibiotics; and Tetracyclines & related   Assessment / Plan:     Visit Diagnoses: No diagnosis found.     Orders: No orders of the defined types were placed in this encounter.  No orders of the defined types were placed in this encounter.   Face-to-face time spent with patient was *** minutes. 50% of time was spent in counseling and coordination of care.  Follow-Up Instructions: No Follow-up on file.   Ellen HenriMarissa C Calise Dunckel, CMA  Note - This record has been created using Animal nutritionistDragon software.  Chart creation errors have been sought, but may not always  have been located. Such creation errors do not reflect on  the standard of medical care.

## 2017-01-15 ENCOUNTER — Ambulatory Visit: Payer: Medicare Other | Admitting: Rheumatology

## 2017-01-19 ENCOUNTER — Ambulatory Visit: Payer: Medicare Other | Admitting: Rheumatology

## 2017-01-22 ENCOUNTER — Other Ambulatory Visit: Payer: Self-pay | Admitting: Internal Medicine

## 2017-01-22 DIAGNOSIS — R404 Transient alteration of awareness: Secondary | ICD-10-CM

## 2017-01-27 ENCOUNTER — Ambulatory Visit: Payer: Medicare Other

## 2017-02-16 ENCOUNTER — Telehealth: Payer: Self-pay | Admitting: Neurology

## 2017-02-16 NOTE — Telephone Encounter (Signed)
Pt. Mother passed away and states she will callback to r/s.

## 2017-02-16 NOTE — Telephone Encounter (Signed)
Dr. Anne HahnWIllis- [place] ChildesFYI

## 2017-02-18 ENCOUNTER — Ambulatory Visit: Payer: Self-pay | Admitting: Neurology

## 2017-02-20 ENCOUNTER — Other Ambulatory Visit: Payer: Self-pay | Admitting: Rheumatology

## 2017-03-19 ENCOUNTER — Other Ambulatory Visit: Payer: Self-pay | Admitting: Rheumatology

## 2017-03-19 NOTE — Telephone Encounter (Signed)
Last visit: 05/15/2016 Next visit: message sent to front desk to schedule appointment.   Last fill: 09/03/2016 with 5 refills.   Okay to refill per Dr. Corliss Skainseveshwar.

## 2017-03-20 ENCOUNTER — Telehealth: Payer: Self-pay | Admitting: Rheumatology

## 2017-03-20 NOTE — Telephone Encounter (Signed)
-----   Message from Ellen HenriMarissa C Graves, CMA sent at 03/19/2017  4:30 PM EST ----- Please call and schedule follow up visit for patient.  Thanks!

## 2017-03-20 NOTE — Telephone Encounter (Signed)
Unable to leave message - no answering machine - no alternate number

## 2017-03-26 ENCOUNTER — Telehealth: Payer: Self-pay | Admitting: Rheumatology

## 2017-03-26 MED ORDER — BACLOFEN 10 MG PO TABS: 10.0000 mg | ORAL_TABLET | Freq: Two times a day (BID) | ORAL | 0 refills | Status: DC

## 2017-03-26 NOTE — Telephone Encounter (Signed)
Last visit: 05/15/2016 Next visit: 04/01/17  Okay to refill per Dr. Corliss Skainseveshwar

## 2017-03-26 NOTE — Telephone Encounter (Signed)
Patient called requesting a prescription refill of Baclofen.  Patient uses [facility] in [place].  Patient states she needed to schedule an appointment (3/13 at 1:20 with Ladona Ridgelaylor) to refill the prescription

## 2017-03-27 NOTE — Progress Notes (Deleted)
Office Visit Note  Patient: Shannon Flores             Date of Birth: Aug 14, 1950           MRN: [id number]             PCP: Lynnea Ferrier, MD Referring: Lynnea Ferrier, MD Visit Date: 04/01/2017 Occupation: @GUAROCC @    Subjective:  No chief complaint on file.   History of Present Illness: Shannon Flores is a 67 y.o. female ***   Activities of Daily Living:  Patient reports morning stiffness for *** {minute/hour:19697}.   Patient {ACTIONS;DENIES/REPORTS:21021675::"Denies"} nocturnal pain.  Difficulty dressing/grooming: {ACTIONS;DENIES/REPORTS:21021675::"Denies"} Difficulty climbing stairs: {ACTIONS;DENIES/REPORTS:21021675::"Denies"} Difficulty getting out of chair: {ACTIONS;DENIES/REPORTS:21021675::"Denies"} Difficulty using hands for taps, buttons, cutlery, and/or writing: {ACTIONS;DENIES/REPORTS:21021675::"Denies"}   No Rheumatology ROS completed.   PMFS History:  Patient Active Problem List   Diagnosis Date Noted  . History of seronegative inflammatory arthritis 04/23/2016  . Fibromyalgia syndrome 04/23/2016  . DDD (degenerative disc disease), cervical 04/23/2016  . Carpal tunnel syndrome on left 04/23/2016  . Primary osteoarthritis of both hands 04/23/2016  . History of hypertension 04/23/2016  . History of hypothyroidism 04/23/2016  . History of gastroesophageal reflux (GERD) 04/23/2016  . History of anxiety 04/23/2016  . History of allergic rhinitis 04/23/2016  . History of diabetes mellitus 04/23/2016  . Fibromyalgia 04/11/2016  . Vitamin D deficiency 04/11/2016  . Primary osteoarthritis of both knees 04/11/2016  . History of asthma 04/11/2016  . History of migraine 04/11/2016  . Migraine 09/28/2015  . Asthma without status asthmaticus 08/29/2015  . Chronic pain syndrome 08/29/2015  . DDD (degenerative disc disease), lumbar 02/14/2015  . Ataxia 03/20/2014  . Abnormal electrocardiogram-QT prolongation 06/24/2010  . Sinus tachycardia 06/24/2010      Past Medical History:  Diagnosis Date  . Allergic rhinitis   . Anxiety   . Asthma   . Carpal tunnel syndrome   . Chronic pain syndrome   . Depression   . Diabetes mellitus without complication (HCC)   . Family history of long QT syndrome   . GERD (gastroesophageal reflux disease)   . Hyperlipidemia   . Hypertension   . IBS (irritable bowel syndrome)   . Migraine 09/28/2015  . Migraine headache   . Non-cystic lesion of iris   . Thyroid disease    hypothyroidism    Family History  Problem Relation Age of Onset  . Diabetes Father   . Hypertension Other   . Lung cancer Unknown   . Heart defect Son        Passed suddenly at age 33  . Other Mother        Phlebitis  . Migraines Mother   . Migraines Sister   . Migraines Brother   . Migraines Maternal Grandfather   . Migraines Sister    Past Surgical History:  Procedure Laterality Date  . CARPAL TUNNEL RELEASE  04/2008  . COLPOSCOPY    . meniscal tear    . MENISCUS REPAIR     right knee   . MULTIPLE TOOTH EXTRACTIONS    . VAGINAL HYSTERECTOMY     Social History   Social History Narrative   Lives at home   Left-handed   Caffeine: 2 glasses of tea per day     Objective: Vital Signs: There were no vitals taken for this visit.   Physical Exam   Musculoskeletal Exam: ***  CDAI Exam: No CDAI exam completed.    Investigation: No  additional findings.   Imaging: No results found.  Speciality Comments: No specialty comments available.    Procedures:  No procedures performed Allergies: Propoxyphene; Benadryl [diphenhydramine hcl]; Diflucan [fluconazole, injectable]; Imitrex [sumatriptan base]; Iodine; Iohexol; Metformin; Naprelan [naproxen sodium]; Provera [medroxyprogesterone acetate]; Prozac [fluoxetine hcl]; Statins; Amoxicillin-pot clavulanate; Ceclor [cefaclor]; Epinephrine; Erythromycin; Sulfa antibiotics; and Tetracyclines & related   Assessment / Plan:     Visit Diagnoses: Primary osteoarthritis  of both hands  Primary osteoarthritis of both knees  Primary osteoarthritis of both feet  DDD (degenerative disc disease), cervical  DDD (degenerative disc disease), lumbar  Fibromyalgia  Chronic pain syndrome  Carpal tunnel syndrome on left  Vitamin D deficiency  History of hypothyroidism  History of hypertension  History of gastroesophageal reflux (GERD)  History of anxiety  History of allergic rhinitis  History of diabetes mellitus  History of migraine  History of asthma    Orders: No orders of the defined types were placed in this encounter.  No orders of the defined types were placed in this encounter.   Face-to-face time spent with patient was *** minutes. 50% of time was spent in counseling and coordination of care.  Follow-Up Instructions: No Follow-up on file.   Gearldine Bienenstockaylor M Dale, PA-C  Note - This record has been created using Dragon software.  Chart creation errors have been sought, but may not always  have been located. Such creation errors do not reflect on  the standard of medical care.

## 2017-04-01 ENCOUNTER — Ambulatory Visit: Payer: Medicare Other | Admitting: Physician Assistant

## 2017-04-02 NOTE — Progress Notes (Deleted)
Office Visit Note  Patient: Shannon Flores             Date of Birth: Aug 19, 1950           MRN: [id number]             PCP: Lynnea Ferrier, MD Referring: Lynnea Ferrier, MD Visit Date: 04/09/2017 Occupation: @GUAROCC @    Subjective:  No chief complaint on file.   History of Present Illness: Shannon Flores is a 67 y.o. female ***   Activities of Daily Living:  Patient reports morning stiffness for *** {minute/hour:19697}.   Patient {ACTIONS;DENIES/REPORTS:21021675::"Denies"} nocturnal pain.  Difficulty dressing/grooming: {ACTIONS;DENIES/REPORTS:21021675::"Denies"} Difficulty climbing stairs: {ACTIONS;DENIES/REPORTS:21021675::"Denies"} Difficulty getting out of chair: {ACTIONS;DENIES/REPORTS:21021675::"Denies"} Difficulty using hands for taps, buttons, cutlery, and/or writing: {ACTIONS;DENIES/REPORTS:21021675::"Denies"}   No Rheumatology ROS completed.   PMFS History:  Patient Active Problem List   Diagnosis Date Noted  . History of seronegative inflammatory arthritis 04/23/2016  . Fibromyalgia syndrome 04/23/2016  . DDD (degenerative disc disease), cervical 04/23/2016  . Carpal tunnel syndrome on left 04/23/2016  . Primary osteoarthritis of both hands 04/23/2016  . History of hypertension 04/23/2016  . History of hypothyroidism 04/23/2016  . History of gastroesophageal reflux (GERD) 04/23/2016  . History of anxiety 04/23/2016  . History of allergic rhinitis 04/23/2016  . History of diabetes mellitus 04/23/2016  . Fibromyalgia 04/11/2016  . Vitamin D deficiency 04/11/2016  . Primary osteoarthritis of both knees 04/11/2016  . History of asthma 04/11/2016  . History of migraine 04/11/2016  . Migraine 09/28/2015  . Asthma without status asthmaticus 08/29/2015  . Chronic pain syndrome 08/29/2015  . DDD (degenerative disc disease), lumbar 02/14/2015  . Ataxia 03/20/2014  . Abnormal electrocardiogram-QT prolongation 06/24/2010  . Sinus tachycardia 06/24/2010      Past Medical History:  Diagnosis Date  . Allergic rhinitis   . Anxiety   . Asthma   . Carpal tunnel syndrome   . Chronic pain syndrome   . Depression   . Diabetes mellitus without complication (HCC)   . Family history of long QT syndrome   . GERD (gastroesophageal reflux disease)   . Hyperlipidemia   . Hypertension   . IBS (irritable bowel syndrome)   . Migraine 09/28/2015  . Migraine headache   . Non-cystic lesion of iris   . Thyroid disease    hypothyroidism    Family History  Problem Relation Age of Onset  . Diabetes Father   . Hypertension Other   . Lung cancer Unknown   . Heart defect Son        Passed suddenly at age 56  . Other Mother        Phlebitis  . Migraines Mother   . Migraines Sister   . Migraines Brother   . Migraines Maternal Grandfather   . Migraines Sister    Past Surgical History:  Procedure Laterality Date  . CARPAL TUNNEL RELEASE  04/2008  . COLPOSCOPY    . meniscal tear    . MENISCUS REPAIR     right knee   . MULTIPLE TOOTH EXTRACTIONS    . VAGINAL HYSTERECTOMY     Social History   Social History Narrative   Lives at home   Left-handed   Caffeine: 2 glasses of tea per day     Objective: Vital Signs: There were no vitals taken for this visit.   Physical Exam   Musculoskeletal Exam: ***  CDAI Exam: No CDAI exam completed.    Investigation: No  additional findings.   Imaging: No results found.  Speciality Comments: No specialty comments available.    Procedures:  No procedures performed Allergies: Propoxyphene; Benadryl [diphenhydramine hcl]; Diflucan [fluconazole, injectable]; Imitrex [sumatriptan base]; Iodine; Iohexol; Metformin; Naprelan [naproxen sodium]; Provera [medroxyprogesterone acetate]; Prozac [fluoxetine hcl]; Statins; Amoxicillin-pot clavulanate; Ceclor [cefaclor]; Epinephrine; Erythromycin; Sulfa antibiotics; and Tetracyclines & related   Assessment / Plan:     Visit Diagnoses: Primary osteoarthritis  of both hands  Primary osteoarthritis of both knees  Primary osteoarthritis of both feet  DDD (degenerative disc disease), cervical  DDD (degenerative disc disease), lumbar  Carpal tunnel syndrome on left  Fibromyalgia  Chronic pain syndrome  Vitamin D deficiency  History of hypertension  History of migraine  History of asthma  History of hypothyroidism  History of gastroesophageal reflux (GERD)  History of anxiety  History of allergic rhinitis  History of diabetes mellitus    Orders: No orders of the defined types were placed in this encounter.  No orders of the defined types were placed in this encounter.   Face-to-face time spent with patient was *** minutes. 50% of time was spent in counseling and coordination of care.  Follow-Up Instructions: No Follow-up on file.   Gearldine Bienenstockaylor M Carold Eisner, PA-C  Note - This record has been created using Dragon software.  Chart creation errors have been sought, but may not always  have been located. Such creation errors do not reflect on  the standard of medical care.

## 2017-04-09 ENCOUNTER — Ambulatory Visit: Payer: Medicare Other | Admitting: Physician Assistant

## 2017-04-09 NOTE — Progress Notes (Deleted)
Office Visit Note  Patient: Shannon Flores             Date of Birth: [birth year]           MRN: [id number]             PCP: Lynnea FerrierKlein, Bert J III, MD Referring: Lynnea FerrierKlein, Bert J III, MD Visit Date: 04/16/2017 Occupation: @GUAROCC @    Subjective:  No chief complaint on file.   History of Present Illness: Shannon Canningatricia P Chaplin is a 67 y.o. female ***   Activities of Daily Living:  Patient reports morning stiffness for *** {minute/hour:19697}.   Patient {ACTIONS;DENIES/REPORTS:21021675::"Denies"} nocturnal pain.  Difficulty dressing/grooming: {ACTIONS;DENIES/REPORTS:21021675::"Denies"} Difficulty climbing stairs: {ACTIONS;DENIES/REPORTS:21021675::"Denies"} Difficulty getting out of chair: {ACTIONS;DENIES/REPORTS:21021675::"Denies"} Difficulty using hands for taps, buttons, cutlery, and/or writing: {ACTIONS;DENIES/REPORTS:21021675::"Denies"}   No Rheumatology ROS completed.   PMFS History:  Patient Active Problem List   Diagnosis Date Noted  . History of seronegative inflammatory arthritis 04/23/2016  . Fibromyalgia syndrome 04/23/2016  . DDD (degenerative disc disease), cervical 04/23/2016  . Carpal tunnel syndrome on left 04/23/2016  . Primary osteoarthritis of both hands 04/23/2016  . History of hypertension 04/23/2016  . History of hypothyroidism 04/23/2016  . History of gastroesophageal reflux (GERD) 04/23/2016  . History of anxiety 04/23/2016  . History of allergic rhinitis 04/23/2016  . History of diabetes mellitus 04/23/2016  . Fibromyalgia 04/11/2016  . Vitamin D deficiency 04/11/2016  . Primary osteoarthritis of both knees 04/11/2016  . History of asthma 04/11/2016  . History of migraine 04/11/2016  . Migraine 09/28/2015  . Asthma without status asthmaticus 08/29/2015  . Chronic pain syndrome 08/29/2015  . DDD (degenerative disc disease), lumbar 02/14/2015  . Ataxia 03/20/2014  . Abnormal electrocardiogram-QT prolongation 06/24/2010  . Sinus tachycardia 06/24/2010      Past Medical History:  Diagnosis Date  . Allergic rhinitis   . Anxiety   . Asthma   . Carpal tunnel syndrome   . Chronic pain syndrome   . Depression   . Diabetes mellitus without complication (HCC)   . Family history of long QT syndrome   . GERD (gastroesophageal reflux disease)   . Hyperlipidemia   . Hypertension   . IBS (irritable bowel syndrome)   . Migraine 09/28/2015  . Migraine headache   . Non-cystic lesion of iris   . Thyroid disease    hypothyroidism    Family History  Problem Relation Age of Onset  . Diabetes Father   . Hypertension Other   . Lung cancer Unknown   . Heart defect Son        Passed suddenly at age 69  . Other Mother        Phlebitis  . Migraines Mother   . Migraines Sister   . Migraines Brother   . Migraines Maternal Grandfather   . Migraines Sister    Past Surgical History:  Procedure Laterality Date  . CARPAL TUNNEL RELEASE  04/2008  . COLPOSCOPY    . meniscal tear    . MENISCUS REPAIR     right knee   . MULTIPLE TOOTH EXTRACTIONS    . VAGINAL HYSTERECTOMY     Social History   Social History Narrative   Lives at home   Left-handed   Caffeine: 2 glasses of tea per day     Objective: Vital Signs: There were no vitals taken for this visit.   Physical Exam   Musculoskeletal Exam: ***  CDAI Exam: No CDAI exam completed.    Investigation: No  additional findings.   Imaging: No results found.  Speciality Comments: No specialty comments available.    Procedures:  No procedures performed Allergies: Propoxyphene; Benadryl [diphenhydramine hcl]; Diflucan [fluconazole, injectable]; Imitrex [sumatriptan base]; Iodine; Iohexol; Metformin; Naprelan [naproxen sodium]; Provera [medroxyprogesterone acetate]; Prozac [fluoxetine hcl]; Statins; Amoxicillin-pot clavulanate; Ceclor [cefaclor]; Epinephrine; Erythromycin; Sulfa antibiotics; and Tetracyclines & related   Assessment / Plan:     Visit Diagnoses: Primary osteoarthritis  of both hands  Primary osteoarthritis of both knees  Primary osteoarthritis of feet, bilateral  DDD (degenerative disc disease), cervical  DDD (degenerative disc disease), lumbar  Carpal tunnel syndrome on left  Fibromyalgia  Chronic pain syndrome  Vitamin D deficiency  History of asthma  History of migraine  History of hypertension  History of hypothyroidism  History of gastroesophageal reflux (GERD)  History of anxiety  History of allergic rhinitis  History of diabetes mellitus    Orders: No orders of the defined types were placed in this encounter.  No orders of the defined types were placed in this encounter.   Face-to-face time spent with patient was *** minutes. 50% of time was spent in counseling and coordination of care.  Follow-Up Instructions: No follow-ups on file.   Gearldine Bienenstock, PA-C  Note - This record has been created using Dragon software.  Chart creation errors have been sought, but may not always  have been located. Such creation errors do not reflect on  the standard of medical care.

## 2017-04-10 ENCOUNTER — Other Ambulatory Visit: Payer: Self-pay | Admitting: Internal Medicine

## 2017-04-10 DIAGNOSIS — R404 Transient alteration of awareness: Secondary | ICD-10-CM

## 2017-04-16 ENCOUNTER — Ambulatory Visit: Payer: Medicare Other | Admitting: Physician Assistant

## 2017-04-17 ENCOUNTER — Other Ambulatory Visit: Payer: Self-pay | Admitting: Rheumatology

## 2017-05-13 ENCOUNTER — Other Ambulatory Visit: Payer: Self-pay | Admitting: Internal Medicine

## 2017-05-21 ENCOUNTER — Other Ambulatory Visit: Payer: Self-pay | Admitting: Internal Medicine

## 2017-05-21 ENCOUNTER — Other Ambulatory Visit: Payer: Self-pay | Admitting: Rheumatology

## 2017-05-21 ENCOUNTER — Ambulatory Visit
Admission: RE | Admit: 2017-05-21 | Discharge: 2017-05-21 | Disposition: A | Discharge status: Home / Self Care | Payer: Medicare Other | Source: Ambulatory Visit | Attending: Internal Medicine | Admitting: Internal Medicine

## 2017-05-21 DIAGNOSIS — R404 Transient alteration of awareness: Secondary | ICD-10-CM

## 2017-05-21 DIAGNOSIS — M7989 Other specified soft tissue disorders: Secondary | ICD-10-CM | POA: Diagnosis present

## 2017-05-21 DIAGNOSIS — R413 Other amnesia: Secondary | ICD-10-CM | POA: Insufficient documentation

## 2017-05-21 DIAGNOSIS — G319 Degenerative disease of nervous system, unspecified: Secondary | ICD-10-CM | POA: Diagnosis not present

## 2018-01-01 DIAGNOSIS — F423 Hoarding disorder: Secondary | ICD-10-CM | POA: Insufficient documentation

## 2018-01-13 DIAGNOSIS — I4581 Long QT syndrome: Secondary | ICD-10-CM | POA: Insufficient documentation

## 2018-02-09 ENCOUNTER — Telehealth: Payer: Self-pay | Admitting: *Deleted

## 2018-02-09 NOTE — Telephone Encounter (Signed)
NOTES FAXED TO [facility] OFFICE FROM [facility], [place]. INTERNAL MEDICINE [phone].

## 2018-02-11 ENCOUNTER — Ambulatory Visit: Payer: Medicare Other | Admitting: Internal Medicine

## 2018-02-24 DIAGNOSIS — R296 Repeated falls: Secondary | ICD-10-CM | POA: Insufficient documentation

## 2018-02-24 DIAGNOSIS — R404 Transient alteration of awareness: Secondary | ICD-10-CM | POA: Insufficient documentation

## 2018-02-25 ENCOUNTER — Ambulatory Visit: Payer: Medicare Other | Admitting: Internal Medicine

## 2018-03-01 ENCOUNTER — Other Ambulatory Visit (HOSPITAL_COMMUNITY): Payer: Self-pay | Admitting: Acute Care

## 2018-03-01 ENCOUNTER — Other Ambulatory Visit: Payer: Self-pay | Admitting: Acute Care

## 2018-03-01 DIAGNOSIS — R296 Repeated falls: Secondary | ICD-10-CM

## 2018-03-01 DIAGNOSIS — R404 Transient alteration of awareness: Secondary | ICD-10-CM

## 2018-03-03 ENCOUNTER — Ambulatory Visit
Admission: RE | Admit: 2018-03-03 | Discharge: 2018-03-03 | Disposition: A | Discharge status: Home / Self Care | Payer: Medicare Other | Source: Ambulatory Visit | Attending: Acute Care | Admitting: Acute Care

## 2018-03-03 DIAGNOSIS — R404 Transient alteration of awareness: Secondary | ICD-10-CM | POA: Insufficient documentation

## 2018-03-03 DIAGNOSIS — R296 Repeated falls: Secondary | ICD-10-CM

## 2018-03-30 ENCOUNTER — Encounter: Payer: Self-pay | Admitting: Psychiatry

## 2018-03-30 ENCOUNTER — Ambulatory Visit: Payer: Medicare Other | Admitting: Psychiatry

## 2018-03-30 VITALS — BP 129/83 | HR 84 | Temp 99.2°F | Wt 210.8 lb

## 2018-03-30 DIAGNOSIS — F423 Hoarding disorder: Secondary | ICD-10-CM

## 2018-03-30 DIAGNOSIS — F331 Major depressive disorder, recurrent, moderate: Secondary | ICD-10-CM

## 2018-03-30 DIAGNOSIS — Z87898 Personal history of other specified conditions: Secondary | ICD-10-CM

## 2018-03-30 DIAGNOSIS — I10 Essential (primary) hypertension: Secondary | ICD-10-CM | POA: Insufficient documentation

## 2018-03-30 DIAGNOSIS — F09 Unspecified mental disorder due to known physiological condition: Secondary | ICD-10-CM

## 2018-03-30 DIAGNOSIS — G4700 Insomnia, unspecified: Secondary | ICD-10-CM | POA: Diagnosis not present

## 2018-03-30 DIAGNOSIS — Z9289 Personal history of other medical treatment: Secondary | ICD-10-CM | POA: Insufficient documentation

## 2018-03-30 DIAGNOSIS — M8580 Other specified disorders of bone density and structure, unspecified site: Secondary | ICD-10-CM | POA: Insufficient documentation

## 2018-03-30 DIAGNOSIS — Z7409 Other reduced mobility: Secondary | ICD-10-CM | POA: Insufficient documentation

## 2018-03-30 DIAGNOSIS — E039 Hypothyroidism, unspecified: Secondary | ICD-10-CM | POA: Insufficient documentation

## 2018-03-30 MED ORDER — SUVOREXANT 5 MG PO TABS: 5.0000 mg | ORAL_TABLET | Freq: Every day | ORAL | 0 refills | Status: DC

## 2018-03-30 MED ORDER — OXCARBAZEPINE 150 MG PO TABS: 150.0000 mg | ORAL_TABLET | Freq: Two times a day (BID) | ORAL | 0 refills | Status: DC

## 2018-03-30 NOTE — Patient Instructions (Signed)
Oxcarbazepine tablets What is this medicine? OXCARBAZEPINE (ox car BAZ e peen) is used to treat people with epilepsy. It helps prevent partial seizures. This medicine may be used for other purposes; ask your health care provider or pharmacist if you have questions. COMMON BRAND NAME(S): Trileptal What should I tell my health care provider before I take this medicine? They need to know if you have any of these conditions: -Asian ancestry -kidney disease -liver disease -suicidal thoughts, plans, or attempt; a previous suicide attempt by you or a family member -any unusual or allergic reaction to oxcarbazepine, carbamazepine, other medicines, foods, dyes, or preservatives -pregnant or trying to get pregnant -breast-feeding How should I use this medicine? Take this medicine by mouth with a glass of water. Follow the directions on the prescription label. This medicine may be taken with or without food. Take your doses at regular intervals. Do not take your medicine more often than directed. Do not stop taking except on the advice of your doctor or health care professional. A special MedGuide will be given to you by the pharmacist with each prescription and refill. Be sure to read this information carefully each time. Talk to your pediatrician regarding the use of this medicine in children. While this medicine may be prescribed for children as young as 2 years for selected conditions, precautions do apply. Overdosage: If you think you have taken too much of this medicine contact a poison control center or emergency room at once. NOTE: This medicine is only for you. Do not share this medicine with others. What if I miss a dose? If you miss a dose, take it as soon as you can. If it is almost time for your next dose, take only that dose. Do not take double or extra doses. What may interact with this medicine? Do not take this medicine with any of the following medications: -carbamazepine This  medicine may also interact with the following medications: -birth control pills -certain medicines for seizures like phenobarbital, phenytoin, valproic acid -certain medicines for high blood pressure like felodipine, diltiazem, verapamil -cyclosporine This list may not describe all possible interactions. Give your health care provider a list of all the medicines, herbs, non-prescription drugs, or dietary supplements you use. Also tell them if you smoke, drink alcohol, or use illegal drugs. Some items may interact with your medicine. What should I watch for while using this medicine? Visit your doctor or health care professional for regular checks on your progress. Do not stop taking this medicine suddenly. This increases the risk of seizures. Wear a Prob[employer] officer or necklace. Carry an identification card with information about your condition, medications, and doctor or health care professional. Rarely, serious skin allergic reactions may occur with this medicine. If you develop a skin rash, redness, itching, peeling skin inside your mouth, swollen glands, or a fever while taking this medicine, contact your health care provider immediately. You may get drowsy or dizzy. Do not drive, use machinery, or do anything that needs mental alertness until you know how this drug affects you. Do not stand or sit up quickly, especially if you are an older patient. This reduces the risk of dizzy or fainting spells. Alcohol can make you more drowsy and dizzy. Avoid alcoholic drinks. Birth control pills may not work properly while you are taking this medicine. Talk to your doctor about using an extra method of birth control. The use of this medicine may increase the chance of suicidal thoughts or actions. Pay special attention  to how you are responding while on this medicine. Any worsening of mood, or thoughts of suicide or dying should be reported to your health care professional right away. Women who become  pregnant while using this medicine may enroll in the [place] [employer] Antiepileptic Drug Pregnancy Registry by calling [id number]. This registry collects information about the safety of antiepileptic drug use during pregnancy. What side effects may I notice from receiving this medicine? Side effects that you should report to your doctor or health care professional as soon as possible: -allergic reactions such as skin rash or itching, hives, swelling of the lips, mouth, tongue, or throat -changes in vision -confusion -difficulty passing urine or change in the amount of urine -fever -infection -nausea, vomiting -problems with balance, speaking, walking -redness, blistering, peeling or loosening of the skin, including inside the mouth -swelling of feet, hands -unusual bleeding, bruising -unusually weak or tired -worsening of mood, thoughts or actions of suicide or dying -yellowing of eyes, skin Side effects that usually do not require medical attention (report to your doctor or health care professional if they continue or are bothersome): -constipation or diarrhea -headache -loss of appetite -nervous -stomach upset -tremors -trouble sleeping This list may not describe all possible side effects. Call your doctor for medical advice about side effects. You may report side effects to FDA at 1-800-FDA-1088. Where should I keep my medicine? Keep out of reach of children. Store at room temperature between 15 and 30 degrees C (59 and 86 degrees F). Keep container tightly closed. Throw away any unused medicine after the expiration date. NOTE: This sheet is a summary. It may not cover all possible information. If you have questions about this medicine, talk to your doctor, pharmacist, or health care provider.  2019 Elsevier/Gold Standard (2012-07-07 15:09:57) Suvorexant oral tablets What is this medicine? SUVOREXANT (su-vor-EX-ant) is used to treat insomnia. This medicine helps you to fall  asleep and sleep through the night. This medicine may be used for other purposes; ask your health care provider or pharmacist if you have questions. COMMON BRAND NAME(S): Belsomra What should I tell my health care provider before I take this medicine? They need to know if you have any of these conditions: -depression -history of a drug or alcohol abuse problem -history of daytime sleepiness -history of sudden onset of muscle weakness (cataplexy) -liver disease -lung or breathing disease -narcolepsy -suicidal thoughts, plans, or attempt; a previous suicide attempt by you or a family member -an unusual or allergic reaction to suvorexant, other medicines, foods, dyes, or preservatives -pregnant or trying to get pregnant -breast-feeding How should I use this medicine? Take this medicine by mouth within 30 minutes of going to bed. Do not take it unless you are able to stay in bed a full night before you must be active again. Follow the directions on the prescription label. For best results, it is better to take this medicine on an empty stomach. Do not take your medicine more often than directed. Do not stop taking this medicine on your own. Always follow your doctor or health care professional's advice. A special MedGuide will be given to you by the pharmacist with each prescription and refill. Be sure to read this information carefully each time. Talk to your pediatrician regarding the use of this medicine in children. Special care may be needed. Overdosage: If you think you have taken too much of this medicine contact a poison control center or emergency room at once. NOTE: This medicine is only for  you. Do not share this medicine with others. What if I miss a dose? This medicine should only be taken immediately before going to sleep. Do not take double or extra doses. What may interact with this medicine? -alcohol -antiviral medicines for HIV or AIDS -aprepitant -carbamazepine -certain  antibiotics like ciprofloxacin, clarithromycin, erythromycin, telithromycin -certain medicines for depression or psychotic disturbances -certain medicines for fungal infections like ketoconazole, posaconazole, fluconazole, or itraconazole -conivaptan -digoxin -diltiazem -grapefruit juice -imatinib -medicines for anxiety or sleep -phenytoin -rifampin -verapamil This list may not describe all possible interactions. Give your health care provider a list of all the medicines, herbs, non-prescription drugs, or dietary supplements you use. Also tell them if you smoke, drink alcohol, or use illegal drugs. Some items may interact with your medicine. What should I watch for while using this medicine? Visit your doctor or health care professional for regular checks on your progress. Keep a regular sleep schedule by going to bed at about the same time each night. Avoid caffeine-containing drinks in the evening hours. When sleep medicines are used every night for more than a few weeks, they may stop working. Do not increase the dose on your own. Talk to your doctor if your insomnia worsens or is not better within 7 to 10 days. After taking this medicine, you may get up out of bed and do an activity that you do not know you are doing. The next morning, you may have no memory of this. Activities include driving a car ("sleep-driving"), making and eating food, talking on the phone, sexual activity, and sleep-walking. Serious injuries have occurred. Call your doctor right away if you find out you have done any of these activities. Do not take this medicine if you have used alcohol that evening. Do not take it if you have taken another medicine for sleep. The risk of doing these sleep-related activities is higher. Do not take this medicine unless you are able to stay in bed for a full night (7 to 8 hours) and do not drive or perform other activities requiring full alertness within 8 hours of a dose. Do not drive, use  machinery, or do anything that needs mental alertness the day after you take the 20 mg dose of this medicine. The use of lower doses (10 mg) also has the potential to cause driving impairment the next day. You may have a decrease in mental alertness the day after use, even if you feel that you are fully awake. Tell your doctor if you will need to perform activities requiring full alertness, such as driving, the next day. Do not stand or sit up quickly after taking this medicine, especially if you are an older patient. This reduces the risk of dizzy or fainting spells. If you or your family notice any changes in your behavior, such as new or worsening depression, thoughts of harming yourself, anxiety, other unusual or disturbing thoughts, or memory loss, call your doctor right away. What side effects may I notice from receiving this medicine? Side effects that you should report to your doctor or health care professional as soon as possible: -allergic reactions like skin rash, itching or hives, swelling of the face, lips, or tongue -confusion -depressed mood -feeling faint or lightheaded, falls -hallucinations -inability to move or speak for up to several minutes while you are going to sleep or waking up -memory loss -periods of leg weakness lasting from seconds to a few minutes -problems with balance, speaking, walking -restlessness, excitability, or feelings of  agitation -unusual activities while asleep like driving, eating, making phone calls Side effects that usually do not require medical attention (report to your doctor or health care professional if they continue or are bothersome): -abnormal dreams -daytime drowsiness -diarrhea -dizziness -headache This list may not describe all possible side effects. Call your doctor for medical advice about side effects. You may report side effects to FDA at 1-800-FDA-1088. Where should I keep my medicine? Keep out of the reach of children. This  medicine can be abused. Keep your medicine in a safe place to protect it from theft. Do not share this medicine with anyone. Selling or giving away this medicine is dangerous and against the law. Store at room temperature between 15 and 30 degrees C (59 and 86 degrees F). Throw away any unused medicine after the expiration date. NOTE: This sheet is a summary. It may not cover all possible information. If you have questions about this medicine, talk to your doctor, pharmacist, or health care provider.  2019 Elsevier/Gold Standard (2017-07-03 12:32:42)
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
## 2018-04-05 ENCOUNTER — Ambulatory Visit: Payer: Medicare Other | Admitting: Licensed Clinical Social Worker

## 2018-04-15 ENCOUNTER — Other Ambulatory Visit: Payer: Self-pay | Admitting: Psychiatry

## 2018-04-15 DIAGNOSIS — F331 Major depressive disorder, recurrent, moderate: Secondary | ICD-10-CM

## 2018-04-28 ENCOUNTER — Encounter: Payer: Self-pay | Admitting: Psychiatry

## 2018-04-28 ENCOUNTER — Other Ambulatory Visit: Payer: Self-pay

## 2018-04-28 ENCOUNTER — Ambulatory Visit (INDEPENDENT_AMBULATORY_CARE_PROVIDER_SITE_OTHER): Payer: Medicare Other | Admitting: Psychiatry

## 2018-04-28 ENCOUNTER — Telehealth: Payer: Self-pay

## 2018-04-28 DIAGNOSIS — G4700 Insomnia, unspecified: Secondary | ICD-10-CM | POA: Diagnosis not present

## 2018-04-28 DIAGNOSIS — F423 Hoarding disorder: Secondary | ICD-10-CM | POA: Diagnosis not present

## 2018-04-28 DIAGNOSIS — F09 Unspecified mental disorder due to known physiological condition: Secondary | ICD-10-CM

## 2018-04-28 DIAGNOSIS — F331 Major depressive disorder, recurrent, moderate: Secondary | ICD-10-CM

## 2018-04-28 MED ORDER — BUPROPION HCL 100 MG PO TABS: 100.0000 mg | ORAL_TABLET | Freq: Every morning | ORAL | 1 refills | Status: DC

## 2018-04-28 MED ORDER — MIRTAZAPINE 7.5 MG PO TABS: 7.5000 mg | ORAL_TABLET | Freq: Every day | ORAL | 0 refills | Status: DC

## 2018-04-28 NOTE — Progress Notes (Signed)
Tc on  04-28-18 @ 9:36 spoke pt medical and surgery  and allergies were reviewed and no changes. Pt medications and pharmacy were reviewed and up dated. pt vitals were not done at this time because this is a phone visit. Marland Kitchen
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
## 2018-04-28 NOTE — Patient Instructions (Signed)
Mirtazapine tablets  What is this medicine?  MIRTAZAPINE (mir TAZ a peen) is used to treat depression.  This medicine may be used for other purposes; ask your health care provider or pharmacist if you have questions.  COMMON BRAND NAME(S): Remeron  What should I tell my health care provider before I take this medicine?  They need to know if you have any of these conditions:  -bipolar disorder  -glaucoma  -kidney disease  -liver disease  -suicidal thoughts  -an unusual or allergic reaction to mirtazapine, other medicines, foods, dyes, or preservatives  -pregnant or trying to get pregnant  -breast-feeding  How should I use this medicine?  Take this medicine by mouth with a glass of water. Follow the directions on the prescription label. Take your medicine at regular intervals. Do not take your medicine more often than directed. Do not stop taking this medicine suddenly except upon the advice of your doctor. Stopping this medicine too quickly may cause serious side effects or your condition may worsen.  A special MedGuide will be given to you by the pharmacist with each prescription and refill. Be sure to read this information carefully each time.  Talk to your pediatrician regarding the use of this medicine in children. Special care may be needed.  Overdosage: If you think you have taken too much of this medicine contact a poison control center or emergency room at once.  NOTE: This medicine is only for you. Do not share this medicine with others.  What if I miss a dose?  If you miss a dose, take it as soon as you can. If it is almost time for your next dose, take only that dose. Do not take double or extra doses.  What may interact with this medicine?  Do not take this medicine with any of the following medications:  -linezolid  -MAOIs like Carbex, Eldepryl, Marplan, Nardil, and Parnate  -methylene blue (injected into a vein)  This medicine may also interact with the following medications:  -alcohol  -antiviral  medicines for HIV or AIDS  -certain medicines that treat or prevent blood clots like warfarin  -certain medicines for depression, anxiety, or psychotic disturbances  -certain medicines for fungal infections like ketoconazole and itraconazole  -certain medicines for migraine headache like almotriptan, eletriptan, frovatriptan, naratriptan, rizatriptan, sumatriptan, zolmitriptan  -certain medicines for seizures like carbamazepine or phenytoin  -certain medicines for sleep  -cimetidine  -erythromycin  -fentanyl  -lithium  -medicines for blood pressure  -nefazodone  -rasagiline  -rifampin  -supplements like St. John's wort, kava kava, valerian  -tramadol  -tryptophan  This list may not describe all possible interactions. Give your health care provider a list of all the medicines, herbs, non-prescription drugs, or dietary supplements you use. Also tell them if you smoke, drink alcohol, or use illegal drugs. Some items may interact with your medicine.  What should I watch for while using this medicine?  Tell your doctor if your symptoms do not get better or if they get worse. Visit your doctor or health care professional for regular checks on your progress. Because it may take several weeks to see the full effects of this medicine, it is important to continue your treatment as prescribed by your doctor.  Patients and their families should watch out for new or worsening thoughts of suicide or depression. Also watch out for sudden changes in feelings such as feeling anxious, agitated, panicky, irritable, hostile, aggressive, impulsive, severely restless, overly excited and hyperactive, or not being   able to sleep. If this happens, especially at the beginning of treatment or after a change in dose, call your health care professional.  You may get drowsy or dizzy. Do not drive, use machinery, or do anything that needs mental alertness until you know how this medicine affects you. Do not stand or sit up quickly, especially if  you are an older patient. This reduces the risk of dizzy or fainting spells. Alcohol may interfere with the effect of this medicine. Avoid alcoholic drinks.  This medicine may cause dry eyes and blurred vision. If you wear contact lenses you may feel some discomfort. Lubricating drops may help. See your eye doctor if the problem does not go away or is severe.  Your mouth may get dry. Chewing sugarless gum or sucking hard candy, and drinking plenty of water may help. Contact your doctor if the problem does not go away or is severe.  What side effects may I notice from receiving this medicine?  Side effects that you should report to your doctor or health care professional as soon as possible:  -allergic reactions like skin rash, itching or hives, swelling of the face, lips, or tongue  -anxious  -changes in vision  -chest pain  -confusion  -elevated mood, decreased need for sleep, racing thoughts, impulsive behavior  -eye pain  -fast, irregular heartbeat  -feeling faint or lightheaded, falls  -feeling agitated, angry, or irritable  -fever or chills, sore throat  -hallucination, loss of contact with reality  -loss of balance or coordination  -mouth sores  -redness, blistering, peeling or loosening of the skin, including inside the mouth  -restlessness, pacing, inability to keep still  -seizures  -stiff muscles  -suicidal thoughts or other mood changes  -trouble passing urine or change in the amount of urine  -trouble sleeping  -unusual bleeding or bruising  -unusually weak or tired  -vomiting  Side effects that usually do not require medical attention (report to your doctor or health care professional if they continue or are bothersome):  -change in appetite  -constipation  -dizziness  -dry mouth  -muscle aches or pains  -nausea  -tired  -weight gain  This list may not describe all possible side effects. Call your doctor for medical advice about side effects. You may report side effects to FDA at 1-800-FDA-1088.  Where  should I keep my medicine?  Keep out of the reach of children.  Store at room temperature between 15 and 30 degrees C (59 and 86 degrees F) Protect from light and moisture. Throw away any unused medicine after the expiration date.  NOTE: This sheet is a summary. It may not cover all possible information. If you have questions about this medicine, talk to your doctor, pharmacist, or health care provider.  © 2019 [employer] (2015-06-07 17:30:45)

## 2018-04-28 NOTE — Telephone Encounter (Signed)
Virtual Visit Pre-Appointment Phone Call    Confirm consent - "In the setting of the current Covid19 crisis, you are scheduled for a PHONE visit with your provider on 05/06/2018 11:30  Just as we do with many in-office visits, in order for you to participate in this visit, we must obtain consent.I can obtain your verbal consent now.  All virtual visits are billed to your insurance company just like a normal visit would be.  By agreeing to a virtual visit, we'd like you to understand that the technology does not allow for your provider to perform an examination, and thus may limit your provider's ability to fully assess your condition.  Finally, though the technology is pretty good, we cannot assure that it will always work on either your or our end, and in the setting of a video visit, we may have to convert it to a phone-only visit.  In either situation, we cannot ensure that we have a secure connection.  Are you willing to proceed?"YES    TELEPHONE CALL NOTE  Shannon Flores has been deemed a candidate for a follow-up tele-health visit to limit community exposure during the Covid-19 pandemic. I spoke with the patient via phone to ensure availability of phone/video source, confirm preferred email & phone number, and discuss instructions and expectations.  I reminded Shannon Flores to be prepared with any vital sign and/or heart rhythm information that could potentially be obtained via home monitoring, at the time of her visit. I reminded Shannon Flores to expect a phone call at the time of her visit if her visit.  Did the patient verbally acknowledge consent to treatment? YES  Aurelio JewKathy M Woodward, RMA 04/28/2018 11:14 AM    CONSENT FOR TELE-HEALTH VISIT - PLEASE REVIEW  I hereby voluntarily request, consent and authorize [facility] and its employed or contracted physicians, physician assistants, nurse practitioners or other licensed health care professionals (the Practitioner), to  provide me with telemedicine health care services (the "Services") as deemed necessary by the treating Practitioner. I acknowledge and consent to receive the Services by the Practitioner via telemedicine. I understand that the telemedicine visit will involve communicating with the Practitioner through live audiovisual communication technology and the disclosure of certain medical information by electronic transmission. I acknowledge that I have been given the opportunity to request an in-person assessment or other available alternative prior to the telemedicine visit and am voluntarily participating in the telemedicine visit.  I understand that I have the right to withhold or withdraw my consent to the use of telemedicine in the course of my care at any time, without affecting my right to future care or treatment, and that the Practitioner or I may terminate the telemedicine visit at any time. I understand that I have the right to inspect all information obtained and/or recorded in the course of the telemedicine visit and may receive copies of available information for a reasonable fee.  I understand that some of the potential risks of receiving the Services via telemedicine include:  Marland Kitchen. Delay or interruption in medical evaluation due to technological equipment failure or disruption; . Information transmitted may not be sufficient (e.g. poor resolution of images) to allow for appropriate medical decision making by the Practitioner; and/or  . In rare instances, security protocols could fail, causing a breach of personal health information.  Furthermore, I acknowledge that it is my responsibility to provide information about my medical history, conditions and care that is complete and accurate to  the best of my ability. I acknowledge that Practitioner's advice, recommendations, and/or decision may be based on factors not within their control, such as incomplete or inaccurate data provided by me or distortions of  diagnostic images or specimens that may result from electronic transmissions. I understand that the practice of medicine is not an exact science and that Practitioner makes no warranties or guarantees regarding treatment outcomes. I acknowledge that I will receive a copy of this consent concurrently upon execution via email to the email address I last provided but may also request a printed copy by calling the office of [facility].    I understand that my insurance will be billed for this visit.   I have read or had this consent read to me. . I understand the contents of this consent, which adequately explains the benefits and risks of the Services being provided via telemedicine.  . I have been provided ample opportunity to ask questions regarding this consent and the Services and have had my questions answered to my satisfaction. . I give my informed consent for the services to be provided through the use of telemedicine in my medical care  By participating in this telemedicine visit I agree to the above.

## 2018-04-29 ENCOUNTER — Ambulatory Visit: Payer: Medicare Other | Admitting: Licensed Clinical Social Worker

## 2018-05-06 ENCOUNTER — Telehealth (INDEPENDENT_AMBULATORY_CARE_PROVIDER_SITE_OTHER): Payer: Medicare Other | Admitting: Internal Medicine

## 2018-05-06 ENCOUNTER — Other Ambulatory Visit: Payer: Self-pay

## 2018-05-06 VITALS — Wt 185.0 lb

## 2018-05-06 DIAGNOSIS — R55 Syncope and collapse: Secondary | ICD-10-CM

## 2018-05-06 NOTE — Progress Notes (Signed)
Electrophysiology TeleHealth Note   Due to national recommendations of social distancing due to COVID 19, an audio/video telehealth visit is felt to be most appropriate for this patient at this time.  See MyChart message from today for the patient's consent to telehealth for [facility].   Date:  05/06/2018   ID:  Shannon Flores, DOB 1950-10-21, MRN [id number]  Location: patient's home  Provider location: [address], [place]  Evaluation Performed: Initial Evaluation  PCP:  Lynnea Ferrier, MD  Cardiologist:  No primary care provider on file. BKlein Electrophysiologist:  None   Chief Complaint: recurrent falls   History of Present Illness:    Shannon Flores is a 68 y.o. female who presents via audio/video conferencing for a telehealth visit today for  Recurrent falls  Hospitalized at [facility] 12/19--for falls but they evaluated for syncope she insists that she has never lost consciousness with any.  Recurrent falls- 5-10/ week and occur without warning  Have happened in the shower-and after standing, never with sitting   No arrthytmia has had electrocardiograms at [facility] concerning for QT prolongation.  She is significantly limited.  She has problems with progressive memory loss.  She has shortness of breath and fatigue.  Now walking with a walker.  Denies chest pain.     Technical issues precluded using video technology at this visit     DATE TEST EF   12/19 Echo  60-65 % No L-R shunt        \ Date Cr K Hgb  12/19 1.01 3.9 14.4            The patient denies symptoms of fevers, chills, cough, or new SOB worrisome for COVID 19.   Past Medical History:  Diagnosis Date  . Allergic rhinitis   . Anxiety   . Asthma   . Carpal tunnel syndrome   . Chronic pain syndrome   . Depression   . Diabetes mellitus without complication (HCC)   . Family history of long QT syndrome   . GERD (gastroesophageal reflux disease)   . Hyperlipidemia    . Hypertension   . IBS (irritable bowel syndrome)   . Migraine 09/28/2015  . Migraine headache   . Non-cystic lesion of iris   . Thyroid disease    hypothyroidism    Past Surgical History:  Procedure Laterality Date  . CARPAL TUNNEL RELEASE  04/2008  . COLPOSCOPY    . KNEE SURGERY Bilateral   . meniscal tear    . MENISCUS REPAIR     right knee   . MULTIPLE TOOTH EXTRACTIONS    . VAGINAL HYSTERECTOMY      Current Outpatient Medications  Medication Sig Dispense Refill  . albuterol (PROAIR HFA) 108 (90 Base) MCG/ACT inhaler Inhale 2 puffs into the lungs every 6 (six) hours as needed.    Marland Kitchen aspirin 81 MG EC tablet Take 81 mg by mouth daily.      Marland Kitchen atenolol (TENORMIN) 100 MG tablet Take 1.5 tablets by mouth daily.    Marland Kitchen azelastine (ASTELIN) 0.1 % nasal spray Place into the nose.    Marland Kitchen buPROPion (WELLBUTRIN) 100 MG tablet Take 1 tablet (100 mg total) by mouth every morning. 30 tablet 1  . cetirizine (ZYRTEC) 10 MG tablet Take 10 mg by mouth daily. Can also take extra 1/2 tablet if needed    . cyclobenzaprine (FLEXERIL) 10 MG tablet TAKE ONE TABLET BY MOUTH TWICE DAILY AS NEEDED FOR  MUSCLE SPASM 60 tablet 5  . gabapentin (NEURONTIN) 100 MG capsule Take by mouth. 3 tablets two times a day.    . levothyroxine (SYNTHROID, LEVOTHROID) 50 MCG tablet Take 50 mcg by mouth daily.      . mirtazapine (REMERON) 7.5 MG tablet Take 1 tablet (7.5 mg total) by mouth at bedtime. Mood, sleep 30 tablet 0  . nystatin cream (MYCOSTATIN) Apply topically.    . OXcarbazepine (TRILEPTAL) 150 MG tablet TAKE 1 TABLET BY MOUTH TWICE DAILY FOR MOOD 60 tablet 0  . RABEprazole (ACIPHEX) 20 MG tablet Take 20 mg by mouth daily.      Marland Kitchen. losartan (COZAAR) 25 MG tablet Take by mouth.    . montelukast (SINGULAIR) 10 MG tablet Take by mouth.     No current facility-administered medications for this visit.     Allergies:   Propoxyphene; Statins; Benadryl [diphenhydramine hcl]; Diflucan [fluconazole, injectable]; Imitrex  [sumatriptan base]; Iodine; Iohexol; Metformin; Naprelan [naproxen sodium]; Provera [medroxyprogesterone acetate]; Prozac [fluoxetine hcl]; Amoxicillin-pot clavulanate; Ceclor [cefaclor]; Clindamycin; Epinephrine; Erythromycin; Sulfa antibiotics; and Tetracyclines & related   Social History:  The patient  reports that she has never smoked. She has never used smokeless tobacco. She reports that she does not drink alcohol or use drugs.   Family History:  The patient's   family history includes Diabetes in her father; Heart defect in her son; Hypertension in an other family member; Lung cancer in an other family member; Migraines in her brother, maternal grandfather, mother, sister, and sister; Other in her mother.   ROS:  Please see the history of present illness.   All other systems are personally reviewed and negative.    Exam:    Vital Signs:  Wt 185 lb (83.9 kg)   BMI 28.98 kg/m       Labs/Other Tests and Data Reviewed:    Recent Labs: No results found for requested labs within last 8760 hours.   Wt Readings from Last 3 Encounters:  05/06/18 185 lb (83.9 kg)  05/15/16 222 lb (100.7 kg)  09/28/15 221 lb (100.2 kg)     Other studies personally reviewed:   [facility]10[facility] with QTc 500  The patient presents wearable device technology report for my review today.     ASSESSMENT & PLAN:    Or[facility]ostatic hypot[facility]  Hypertension  Presyncope  Recurrent confusion  QT prolongation  Dementia  Depression/anxiety  TIA    The patient's QT interval is unlikely related to her symptoms.  [facility] describe orthostatic hypotension having been documented repeatedly.  This is also consistent with her history.  Given her complex medication regime, I would like to avoid pharmacotherapy.  We will try an abdominal binder and thigh sleeves.  We will ask Dr. Thalia PartyB. Klein to look at her medications to see if any of them might be  contributing to her orthostasis and be amenable to discontinuation  Given the frequency of the events, I think a monitor to exclude an arrhythmia is appropriate.  This not withstanding the presumed history.    COVID 19 screen The patient denies symptoms of COVID 19 at this time.  The importance of social distancing was discussed today.  Follow-up:  Telehealth visit   3weeks     Current medicines are reviewed at length with the patient today.   The patient does not have concerns regarding her medicines.  The following changes were made today:  none  Labs/ tests ordered today  include: ZIO patch  No orders of the defined types were placed in this encounter.    Patient Risk:  after full review of this patients clinical status, I feel that they are at moderate risk at this time.  Today, I have spent 32 minutes with the patient with telehealth technology discussing As above  .    Signed, Sherryl Manges, MD  05/06/2018 11:32 AM     [facility] [address] [place] [address] [phone] (office) [phone] (fax)

## 2018-05-06 NOTE — Patient Instructions (Addendum)
Medication Instructions:  - Your physician recommends that you continue on your current medications as directed. Please refer to the Current Medication list given to you today.  If you need a refill on your cardiac medications before your next appointment, please call your pharmacy.   Lab work: - none ordered  If you have labs (blood work) drawn today and your tests are completely normal, you will receive your results only by: Marland Kitchen MyChart Message (if you have MyChart) OR . A paper copy in the mail If you have any lab test that is abnormal or we need to change your treatment, we will call you to review the results.  Testing/Procedures: - Your physician has recommended that you wear a 14 day heart monitor called a ZIO patch. We will order this and have it shipped directly to your home for application. The monitor looks like a big sticker and is applied to the left upper chest, just under the collar bone. This will come with instructions on how to prep the chest prior to application, how to apply correctly and how to activate the monitor.  The company may call you directly just prior to receiving the monitor or once the monitor is received- they typically call from an 800# or 224#- we ask that you do answer any calls from these numbers.   Follow-Up: At [facility], you and your health needs are our priority.  As part of our continuing mission to provide you with exceptional heart care, we have created designated Provider Care Teams.  These Care Teams include your primary Cardiologist (physician) and Advanced Practice Providers (APPs -  Physician Assistants and Nurse Practitioners) who all work together to provide you with the care you need, when you need it. . in 3-4 weeks with Dr. Graciela Husbands (E-visit)  Any Other Special Instructions Will Be Listed Below (If Applicable).  - please wear an abdominal binder / thigh sleeves during your waking hours to help prevent sudden drops in you blood pressure  during the day (these can be ordered through [employer]).

## 2018-05-07 ENCOUNTER — Telehealth: Payer: Self-pay | Admitting: Internal Medicine

## 2018-05-07 NOTE — Telephone Encounter (Signed)
lmov to schedule 4 wk fu zio to be mailed per avs   Printed avs

## 2018-05-10 ENCOUNTER — Ambulatory Visit: Payer: Medicare Other | Admitting: Psychiatry

## 2018-05-18 ENCOUNTER — Other Ambulatory Visit: Payer: Self-pay | Admitting: Psychiatry

## 2018-05-18 DIAGNOSIS — G4700 Insomnia, unspecified: Secondary | ICD-10-CM

## 2018-05-18 DIAGNOSIS — F331 Major depressive disorder, recurrent, moderate: Secondary | ICD-10-CM

## 2018-05-20 ENCOUNTER — Ambulatory Visit (INDEPENDENT_AMBULATORY_CARE_PROVIDER_SITE_OTHER): Payer: Medicare Other | Admitting: Psychiatry

## 2018-05-20 ENCOUNTER — Other Ambulatory Visit: Payer: Self-pay

## 2018-05-20 ENCOUNTER — Encounter: Payer: Self-pay | Admitting: Psychiatry

## 2018-05-20 DIAGNOSIS — F423 Hoarding disorder: Secondary | ICD-10-CM | POA: Diagnosis not present

## 2018-05-20 DIAGNOSIS — F331 Major depressive disorder, recurrent, moderate: Secondary | ICD-10-CM

## 2018-05-20 DIAGNOSIS — G4700 Insomnia, unspecified: Secondary | ICD-10-CM | POA: Diagnosis not present

## 2018-05-20 DIAGNOSIS — F09 Unspecified mental disorder due to known physiological condition: Secondary | ICD-10-CM

## 2018-05-20 MED ORDER — OXCARBAZEPINE 150 MG PO TABS: ORAL_TABLET | ORAL | 1 refills | Status: DC
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
## 2018-06-02 ENCOUNTER — Telehealth: Payer: Self-pay | Admitting: Internal Medicine

## 2018-06-02 NOTE — Telephone Encounter (Signed)
Call received from [place] at [facility]. She states the patient called and advised that her device fell off 1 day after wearing it and she is requesting another one. She was needing our ok to send this to her. I advised this is ok- please send her another monitor.  They will ship another to her.

## 2018-06-02 NOTE — Telephone Encounter (Signed)
Patient returning call  Patient states the ZIO adhesive would not stay on Did not wear it for long Please call to discuss

## 2018-06-02 NOTE — Telephone Encounter (Signed)
I attempted to call the patient. Need to: 1) find out of she wore he ZIO and mailed it back. 2) if so, she needs her f/u e-visit scheduled with Dr. Graciela Husbands (see recall note)

## 2018-06-02 NOTE — Telephone Encounter (Signed)
Patient returning call to talk about what to do with zio.

## 2018-06-02 NOTE — Telephone Encounter (Signed)
I spoke with the patient. She states she received her monitor just before 4/24. She took it to Dr. Doristine Church Klein's office and had it put on. She states it fell off later the same day.  I asked her if she remembers the nurse prepping her chest prior to applying the monitor and she states she doesn't recall her rubbing anything over her chest.   I have advised the patient to please call [employer] at the 888 # provided with the monitor and let them know.   I have asked her to call me back once she speaks with them. She is aware I will call her back later next week to follow up if I don't hear from her prior to that.   She voices understanding of the above and is agreeable.

## 2018-06-16 NOTE — Telephone Encounter (Signed)
lmov

## 2018-06-17 ENCOUNTER — Telehealth: Payer: Self-pay | Admitting: Internal Medicine

## 2018-06-17 NOTE — Telephone Encounter (Signed)
Patient calling  States that she had to get a new monitor since the last one fell off  Would like to know if we could help assist putting it back on  Please call to discuss

## 2018-06-18 ENCOUNTER — Other Ambulatory Visit: Payer: Self-pay | Admitting: Psychiatry

## 2018-06-18 DIAGNOSIS — F331 Major depressive disorder, recurrent, moderate: Secondary | ICD-10-CM

## 2018-06-18 DIAGNOSIS — G4700 Insomnia, unspecified: Secondary | ICD-10-CM

## 2018-06-18 NOTE — Telephone Encounter (Signed)
I spoke with the patient.  She had her ZIO at home. I walked her through how to put her monitor on while on the phone with her.  This was applied and activated successfully.

## 2018-06-21 MED ORDER — MIRTAZAPINE 7.5 MG PO TABS: ORAL_TABLET | ORAL | 1 refills | Status: DC

## 2018-06-21 NOTE — Telephone Encounter (Signed)
Sent Remeron to pharmacy 

## 2018-06-24 ENCOUNTER — Other Ambulatory Visit: Payer: Self-pay

## 2018-06-24 ENCOUNTER — Ambulatory Visit (INDEPENDENT_AMBULATORY_CARE_PROVIDER_SITE_OTHER): Payer: Medicare Other | Admitting: Psychiatry

## 2018-06-24 ENCOUNTER — Encounter: Payer: Self-pay | Admitting: Psychiatry

## 2018-06-24 DIAGNOSIS — F331 Major depressive disorder, recurrent, moderate: Secondary | ICD-10-CM | POA: Diagnosis not present

## 2018-06-24 DIAGNOSIS — F423 Hoarding disorder: Secondary | ICD-10-CM

## 2018-06-24 DIAGNOSIS — G4700 Insomnia, unspecified: Secondary | ICD-10-CM

## 2018-06-24 DIAGNOSIS — F09 Unspecified mental disorder due to known physiological condition: Secondary | ICD-10-CM

## 2018-06-24 MED ORDER — OXCARBAZEPINE 150 MG PO TABS: ORAL_TABLET | ORAL | 1 refills | Status: DC

## 2018-06-24 MED ORDER — BUPROPION HCL 100 MG PO TABS: 100.0000 mg | ORAL_TABLET | Freq: Every morning | ORAL | 1 refills | Status: DC
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
## 2018-07-13 NOTE — Telephone Encounter (Signed)
lmov

## 2018-07-29 ENCOUNTER — Telehealth: Payer: Self-pay | Admitting: Internal Medicine

## 2018-07-29 NOTE — Telephone Encounter (Signed)
Trying to follow up on the patient's monitor.  Per [employer] website, her monitor has been marked "lost."  I attempted to call the patient to see if she still has this or tried to mail it back.   No answer- I left a message to please call back to discuss.

## 2018-07-30 NOTE — Telephone Encounter (Signed)
Patient needs to have monitor placed before follow up appointment. Please see phone note from yesterday created by Alvis Lemmings, RN

## 2018-08-05 NOTE — Telephone Encounter (Signed)
Attempted to call the patient. No answer- I left a message for the patient to please call back.  

## 2018-08-05 NOTE — Telephone Encounter (Signed)
Patient returning voicemail. Would like to be called in the morning.  Thank you

## 2018-08-11 NOTE — Telephone Encounter (Signed)
Recall  Placed in chart and note for triage to fu on [employer]. Closing this duplicate encounter

## 2018-08-12 NOTE — Telephone Encounter (Signed)
Attempted to call the patient.  No answer- I left a message I will try to call her back in the morning.

## 2018-08-13 NOTE — Telephone Encounter (Signed)
Attempted to call the patient. No answer- I left a message that I was calling to follow up on her monitor. I advised this has been marked "lost" on the [employer] website. I was trying to find out if she still had this at home or had tried to mail it back.  I asked that she call back to clarify.

## 2018-08-16 ENCOUNTER — Other Ambulatory Visit: Payer: Self-pay | Admitting: Psychiatry

## 2018-08-16 DIAGNOSIS — G4700 Insomnia, unspecified: Secondary | ICD-10-CM

## 2018-08-16 DIAGNOSIS — F331 Major depressive disorder, recurrent, moderate: Secondary | ICD-10-CM

## 2018-09-09 ENCOUNTER — Telehealth: Payer: Self-pay | Admitting: Internal Medicine

## 2018-09-09 NOTE — Telephone Encounter (Signed)
Patient calling in to speak with Nurse regarding halter monitor status. Please call when able. Monitor is with patient but being marked as lost or misplaced through [facility]

## 2018-09-09 NOTE — Telephone Encounter (Signed)
I called and spoke with the patient. She states she still has her [employer] monitor at home and has 2 different address labels to mail this back to.  She wore 1 monitor and it fell off, then she wore a second one. I tried to reach her in early July to follow up and was unable to reach her.   I have advised her to please call [employer] to verify where this should be mailed.  The patient voices understanding and is agreeable.

## 2018-09-15 ENCOUNTER — Other Ambulatory Visit: Payer: Self-pay | Admitting: Psychiatry

## 2018-09-15 DIAGNOSIS — G4700 Insomnia, unspecified: Secondary | ICD-10-CM

## 2018-09-15 DIAGNOSIS — F331 Major depressive disorder, recurrent, moderate: Secondary | ICD-10-CM

## 2018-10-19 ENCOUNTER — Other Ambulatory Visit: Payer: Self-pay | Admitting: Psychiatry

## 2018-10-19 DIAGNOSIS — G4700 Insomnia, unspecified: Secondary | ICD-10-CM

## 2018-10-19 DIAGNOSIS — F331 Major depressive disorder, recurrent, moderate: Secondary | ICD-10-CM

## 2018-11-15 ENCOUNTER — Other Ambulatory Visit: Payer: Self-pay | Admitting: Psychiatry

## 2018-11-15 DIAGNOSIS — G4700 Insomnia, unspecified: Secondary | ICD-10-CM

## 2018-11-15 DIAGNOSIS — F331 Major depressive disorder, recurrent, moderate: Secondary | ICD-10-CM

## 2018-11-15 MED ORDER — MIRTAZAPINE 7.5 MG PO TABS: ORAL_TABLET | ORAL | 1 refills | Status: DC

## 2018-11-15 NOTE — Telephone Encounter (Signed)
Sent remeron to pharmacy 

## 2018-12-22 ENCOUNTER — Other Ambulatory Visit: Payer: Self-pay | Admitting: Internal Medicine

## 2018-12-22 DIAGNOSIS — Z1231 Encounter for screening mammogram for malignant neoplasm of breast: Secondary | ICD-10-CM

## 2019-04-07 ENCOUNTER — Other Ambulatory Visit: Payer: Self-pay | Admitting: Psychiatry

## 2019-04-07 DIAGNOSIS — F331 Major depressive disorder, recurrent, moderate: Secondary | ICD-10-CM

## 2019-07-03 ENCOUNTER — Other Ambulatory Visit: Payer: Self-pay | Admitting: Psychiatry

## 2019-07-03 DIAGNOSIS — G4700 Insomnia, unspecified: Secondary | ICD-10-CM

## 2019-07-03 DIAGNOSIS — F331 Major depressive disorder, recurrent, moderate: Secondary | ICD-10-CM

## 2020-05-03 ENCOUNTER — Encounter: Payer: Self-pay | Admitting: Internal Medicine

## 2020-05-03 ENCOUNTER — Ambulatory Visit: Payer: Medicare PPO | Admitting: Internal Medicine

## 2020-05-03 ENCOUNTER — Other Ambulatory Visit: Payer: Self-pay

## 2020-05-03 VITALS — BP 112/80 | HR 85 | Ht 67.0 in | Wt 200.0 lb

## 2020-05-03 DIAGNOSIS — I10 Essential (primary) hypertension: Secondary | ICD-10-CM

## 2020-05-03 DIAGNOSIS — R072 Precordial pain: Secondary | ICD-10-CM

## 2020-05-03 DIAGNOSIS — I4581 Long QT syndrome: Secondary | ICD-10-CM

## 2020-05-03 NOTE — Progress Notes (Signed)
Patient Care Team: Lynnea Ferrier, MD as PCP - General (Internal Medicine) [name], MD as Consulting Physician (Rheumatology)   HPI  Shannon Flores is a 70 y.o. female seen in follow-up for history of falls.  Hospitalized at [facility] 12/19 with reports of orthostatic hypotension which were constant with her history as obtained 4/20.  Suggested at that time compression.  But had asked her to follow-up with her PCP regarding medication simplification  She continues to fall without warning.  Has some orthostatic lightheadedness.  No residual nausea or diaphoresis.  Persistent weakness.  Modest dyspnea.  This is worsened over months.  Also has atypical chest pains, frequently associated with vomiting but not always.  Unassociated with radiation dyspnea or diaphoresis  Catheterization 2010 was normal  Complex medical regime.  Hemoglobin A1c has reached almost 9   Records and Results Reviewed   Past Medical History:  Diagnosis Date  . Allergic rhinitis   . Anxiety   . Asthma   . Carpal tunnel syndrome   . Chronic pain syndrome   . Depression   . Diabetes mellitus without complication (HCC)   . Family history of long QT syndrome   . GERD (gastroesophageal reflux disease)   . Hyperlipidemia   . Hypertension   . IBS (irritable bowel syndrome)   . Migraine 09/28/2015  . Migraine headache   . Non-cystic lesion of iris   . Thyroid disease    hypothyroidism    Past Surgical History:  Procedure Laterality Date  . CARPAL TUNNEL RELEASE  04/2008  . COLPOSCOPY    . KNEE SURGERY Bilateral   . meniscal tear    . MENISCUS REPAIR     right knee   . MULTIPLE TOOTH EXTRACTIONS    . VAGINAL HYSTERECTOMY      Current Meds  Medication Sig  . ALPRAZolam (XANAX) 0.5 MG tablet Take 0.5 mg by mouth daily as needed.  Marland Kitchen aspirin 81 MG EC tablet Take 81 mg by mouth daily.  Marland Kitchen atenolol (TENORMIN) 100 MG tablet Take 1.5 tablets by mouth daily.  . canagliflozin  (INVOKANA) 300 MG TABS tablet Take 1 tablet by mouth in the morning. Before breakfast  . cyclobenzaprine (FLEXERIL) 10 MG tablet TAKE ONE TABLET BY MOUTH TWICE DAILY AS NEEDED FOR MUSCLE SPASM  . diphenhydrAMINE (BENADRYL) 25 MG tablet Take 25 mg by mouth every 6 (six) hours as needed.  . gabapentin (NEURONTIN) 100 MG capsule Take by mouth. 3 tablets two times a day.  . levothyroxine (SYNTHROID, LEVOTHROID) 50 MCG tablet Take 50 mcg by mouth daily.  . pioglitazone (ACTOS) 15 MG tablet Take 1 tablet by mouth daily.  . RABEprazole (ACIPHEX) 20 MG tablet Take 20 mg by mouth daily.  . traZODone (DESYREL) 50 MG tablet Take 1 tablet by mouth at bedtime.    Allergies  Allergen Reactions  . Propoxyphene Other (See Comments)    "can't remember"  . Statins Other (See Comments)    unspecified unspecified Unknown reaction   . Benadryl [Diphenhydramine Hcl] Hives  . Diflucan [Fluconazole, Injectable]   . Imitrex [Sumatriptan Base] Other (See Comments)    Tachycardia  . Iodine Hives    IV Dye, Iodine containing contrast media group   . Iohexol   . Metformin Diarrhea  . Naprelan [Naproxen Sodium]     rash  . Provera [Medroxyprogesterone Acetate]   . Prozac [Fluoxetine Hcl]   . Amoxicillin-Pot Clavulanate Rash  . Ceclor [Cefaclor] Rash  .  Clindamycin Rash and Hives  . Epinephrine Palpitations  . Erythromycin Rash  . Sulfa Antibiotics Rash  . Tetracyclines & Related Rash      Review of Systems negative except from HPI and PMH  Physical Exam BP 112/80   Pulse 85   Ht 5\' 7"  (1.702 m)   Wt 200 lb (90.7 kg)   BMI 31.32 kg/m  Well developed and well nourished in no acute distress HENT normal E scleral and icterus clear Neck Supple JVP flat; carotids brisk and full Clear to ausculation  Regular rate and rhythm, no murmurs gallops or rub Soft with active bowel sounds No clubbing cyanosis  Edema Alert and oriented, grossly normal motor and sensory function Skin Warm and  Dry  ECG sinus at 85 Intervals 17/09/38 (QTC 450)     CrCl cannot be calculated (No successful lab value found.).   Assessment and  Plan  Falls  Hypertension  QT prolongation  Chest pain-atypical  Contrast dye allergy  Some dizziness with orthostatics; however, measurements were unimpressive.  Need ongoing follow-up with his PCP about her falls.  Chest discomfort is atypical but in this diabetic woman still concerning, although I was initially dissuaded because of its association with vomiting.  With her dye allergy will undertake Myoview scanning  Blood pressure reasonably controlled   Current medicines are reviewed at length with the patient today .  The patient does not  have concerns regarding medicines.

## 2020-05-03 NOTE — Patient Instructions (Addendum)
Medication Instructions:  - Your physician recommends that you continue on your current medications as directed. Please refer to the Current Medication list given to you today.  *If you need a refill on your cardiac medications before your next appointment, please call your pharmacy*   Lab Work: - none ordered  If you have labs (blood work) drawn today and your tests are completely normal, you will receive your results only by: Marland Kitchen MyChart Message (if you have MyChart) OR . A paper copy in the mail If you have any lab test that is abnormal or we need to change your treatment, we will call you to review the results.   Testing/Procedures: - Your physician has requested that you have a lexiscan myoview (cardiac nuclear scan/ chemical stress test).   [facility] MYOVIEW  Your caregiver has ordered a Stress Test with nuclear imaging. The purpose of this test is to evaluate the blood supply to your heart muscle. This procedure is referred to as a "Non-Invasive Stress Test." This is because other than having an IV started in your vein, nothing is inserted or "invades" your body. Cardiac stress tests are done to find areas of poor blood flow to the heart by determining the extent of coronary artery disease (CAD). Some patients exercise on a treadmill, which naturally increases the blood flow to your heart, while others who are  unable to walk on a treadmill due to physical limitations have a pharmacologic/chemical stress agent called Lexiscan . This medicine will mimic walking on a treadmill by temporarily increasing your coronary blood flow.   Please note: these test may take anywhere between 2-4 hours to complete  PLEASE REPORT TO [facility] MEDICAL MALL ENTRANCE  THE VOLUNTEERS AT THE FIRST DESK WILL DIRECT YOU WHERE TO GO  Date of Procedure:_____________________________________  Arrival Time for Procedure:______________________________  Instructions regarding medication:   __x__ : You may take all of  your regular medications the morning of your test with enough water to get them down safely unless listed below  _x___:  Hold betablocker(s) (ATENOLOL) night before procedure and morning of procedure  PLEASE NOTIFY THE OFFICE AT LEAST 24 HOURS IN ADVANCE IF YOU ARE UNABLE TO KEEP YOUR APPOINTMENT.  [phone] AND  PLEASE NOTIFY NUCLEAR MEDICINE AT [facility] AT LEAST 24 HOURS IN ADVANCE IF YOU ARE UNABLE TO KEEP YOUR APPOINTMENT. [phone]  How to prepare for your Myoview test:  1. Do not eat or drink after midnight 2. No caffeine for 24 hours prior to test 3. No smoking 24 hours prior to test. 4. Your medication may be taken with water.  If your doctor stopped a medication because of this test, do not take that medication. 5. Ladies, please do not wear dresses.  Skirts or pants are appropriate. Please wear a short sleeve shirt. 6. No perfume, cologne or lotion. 7. Wear comfortable walking shoes. No heels!   Follow-Up: At [facility], you and your health needs are our priority.  As part of our continuing mission to provide you with exceptional heart care, we have created designated Provider Care Teams.  These Care Teams include your primary Cardiologist (physician) and Advanced Practice Providers (APPs -  Physician Assistants and Nurse Practitioners) who all work together to provide you with the care you need, when you need it.  We recommend signing up for the patient portal called "MyChart".  Sign up information is provided on this After Visit Summary.  MyChart is used to connect with patients for Virtual Visits (Telemedicine).  Patients are able to view lab/test results, encounter notes, upcoming appointments, etc.  Non-urgent messages can be sent to your provider as well.   To learn more about what you can do with MyChart, go to [email or web address].    Your next appointment:   1 year(s)  The format for your next appointment:   In Person  Provider:   Sherryl Manges,  MD   Other Instructions    Cardiac Nuclear Scan A cardiac nuclear scan is a test that is done to check the flow of blood to your heart. It is done when you are resting and when you are exercising. The test looks for problems such as:  Not enough blood reaching a portion of the heart.  The heart muscle not working as it should. You may need this test if:  You have heart disease.  You have had lab results that are not normal.  You have had heart surgery or a balloon procedure to open up blocked arteries (angioplasty).  You have chest pain.  You have shortness of breath. In this test, a special dye (tracer) is put into your bloodstream. The tracer will travel to your heart. A camera will then take pictures of your heart to see how the tracer moves through your heart. This test is usually done at a hospital and takes 2-4 hours. Tell a doctor about:  Any allergies you have.  All medicines you are taking, including vitamins, herbs, eye drops, creams, and over-the-counter medicines.  Any problems you or family members have had with anesthetic medicines.  Any blood disorders you have.  Any surgeries you have had.  Any medical conditions you have.  Whether you are pregnant or may be pregnant. What are the risks? Generally, this is a safe test. However, problems may occur, such as:  Serious chest pain and heart attack. This is only a risk if the stress portion of the test is done.  Rapid heartbeat.  A feeling of warmth in your chest. This feeling usually does not last long.  Allergic reaction to the tracer. What happens before the test?  Ask your doctor about changing or stopping your normal medicines. This is important.  Follow instructions from your doctor about what you cannot eat or drink.  Remove your jewelry on the day of the test. What happens during the test?  An IV tube will be inserted into one of your veins.  Your doctor will give you a small amount of  tracer through the IV tube.  You will wait for 20-40 minutes while the tracer moves through your bloodstream.  Your heart will be monitored with an electrocardiogram (ECG).  You will lie down on an exam table.  Pictures of your heart will be taken for about 15-20 minutes.  You may also have a stress test. For this test, one of these things may be done: ? You will be asked to exercise on a treadmill or a stationary bike. ? You will be given medicines that will make your heart work harder. This is done if you are unable to exercise.  When blood flow to your heart has peaked, a tracer will again be given through the IV tube.  After 20-40 minutes, you will get back on the exam table. More pictures will be taken of your heart.  Depending on the tracer that is used, more pictures may need to be taken 3-4 hours later.  Your IV tube will be removed when the test is over. The test  may vary among doctors and hospitals. What happens after the test?  Ask your doctor: ? Whether you can return to your normal schedule, including diet, activities, and medicines. ? Whether you should drink more fluids. This will help to remove the tracer from your body. Drink enough fluid to keep your pee (urine) pale yellow.  Ask your doctor, or the department that is doing the test: ? When will my results be ready? ? How will I get my results? Summary  A cardiac nuclear scan is a test that is done to check the flow of blood to your heart.  Tell your doctor whether you are pregnant or may be pregnant.  Before the test, ask your doctor about changing or stopping your normal medicines. This is important.  Ask your doctor whether you can return to your normal activities. You may be asked to drink more fluids. This information is not intended to replace advice given to you by your health care provider. Make sure you discuss any questions you have with your health care provider. Document Revised: 04/28/2018  Document Reviewed: 06/22/2017 Elsevier Patient Education  2021 [employer].

## 2020-05-09 ENCOUNTER — Encounter: Admission: RE | Admit: 2020-05-09 | Payer: Medicare PPO | Source: Ambulatory Visit

## 2020-05-16 ENCOUNTER — Encounter
Admission: RE | Admit: 2020-05-16 | Discharge: 2020-05-16 | Disposition: A | Discharge status: Home / Self Care | Payer: Medicare PPO | Source: Ambulatory Visit | Attending: Internal Medicine | Admitting: Internal Medicine

## 2020-05-16 ENCOUNTER — Other Ambulatory Visit: Payer: Self-pay

## 2020-05-16 DIAGNOSIS — R072 Precordial pain: Secondary | ICD-10-CM | POA: Insufficient documentation

## 2020-05-16 LAB — NM MYOCAR MULTI W/SPECT W/WALL MOTION / EF
Estimated workload: 1 METS
Exercise duration (min): 0 min
Exercise duration (sec): 0 s
LV dias vol: 51 mL (ref 46–106)
LV sys vol: 16 mL
MPHR: 149 {beats}/min
Peak HR: 102 {beats}/min
Percent HR: 68 %
Rest HR: 96 {beats}/min
SDS: 5
SRS: 3
SSS: 3
TID: 1.39

## 2020-05-16 MED ORDER — REGADENOSON 0.4 MG/5ML IV SOLN
0.4000 mg | Freq: Once | INTRAVENOUS | Status: AC
  Administered 2020-05-16: 0.4 mg via INTRAVENOUS

## 2020-05-16 MED ORDER — TECHNETIUM TC 99M TETROFOSMIN IV KIT
30.0000 | PACK | Freq: Once | INTRAVENOUS | Status: AC | PRN
  Administered 2020-05-16: 30.1 via INTRAVENOUS

## 2020-05-16 MED ORDER — TECHNETIUM TC 99M TETROFOSMIN IV KIT
10.0000 | PACK | Freq: Once | INTRAVENOUS | Status: AC | PRN
  Administered 2020-05-16: 9.6 via INTRAVENOUS

## 2020-10-03 ENCOUNTER — Other Ambulatory Visit: Payer: Self-pay | Admitting: Internal Medicine

## 2021-05-23 ENCOUNTER — Ambulatory Visit: Payer: Medicare PPO | Admitting: Internal Medicine

## 2021-05-23 ENCOUNTER — Encounter: Payer: Self-pay | Admitting: Internal Medicine

## 2021-05-23 VITALS — BP 100/60 | HR 91 | Ht 66.0 in | Wt 207.2 lb

## 2021-05-23 DIAGNOSIS — I4581 Long QT syndrome: Secondary | ICD-10-CM

## 2021-05-23 DIAGNOSIS — I10 Essential (primary) hypertension: Secondary | ICD-10-CM | POA: Diagnosis not present

## 2021-05-23 NOTE — Patient Instructions (Signed)
Medication Instructions:  ?- Your physician recommends that you continue on your current medications as directed. Please refer to the Current Medication list given to you today. ? ?*If you need a refill on your cardiac medications before your next appointment, please call your pharmacy* ? ? ?Lab Work: ? None ordered ? ?If you have labs (blood work) drawn today and your tests are completely normal, you will receive your results only by: ?MyChart Message (if you have MyChart) OR ?A paper copy in the mail ?If you have any lab test that is abnormal or we need to change your treatment, we will call you to review the results. ? ? ?Testing/Procedures: ?- none ordered ? ? ?Follow-Up: ?At [facility], you and your health needs are our priority.  As part of our continuing mission to provide you with exceptional heart care, we have created designated Provider Care Teams.  These Care Teams include your primary Cardiologist (physician) and Advanced Practice Providers (APPs -  Physician Assistants and Nurse Practitioners) who all work together to provide you with the care you need, when you need it. ? ?We recommend signing up for the patient portal called "MyChart".  Sign up information is provided on this After Visit Summary.  MyChart is used to connect with patients for Virtual Visits (Telemedicine).  Patients are able to view lab/test results, encounter notes, upcoming appointments, etc.  Non-urgent messages can be sent to your provider as well.   ?To learn more about what you can do with MyChart, go to [email or web address].   ? ?Your next appointment:   ?1 year(s) ? ?The format for your next appointment:   ?In Person ? ?Provider:   ?Sherryl Manges, MD  ? ? ?Other Instructions ?N/a ? ?Important Information About Sugar ? ? ? ? ? ? ?

## 2021-05-23 NOTE — Progress Notes (Signed)
? ? ? ? ?Patient Care Team: ?Adin Hector, MD as PCP - General (Internal Medicine) ?Bo Merino, MD as Consulting Physician (Rheumatology) ? ? ?HPI ? ?Shannon Flores is a 71 y.o. female seen in follow-up for history of falls. ? ?Hospitalized at [facility] 12/19 with reports of orthostatic hypotension which were consistent with her history as obtained 4/20.  Suggested at that time compression.  But had asked her to follow-up with her PCP regarding medication simplification ? ?She continues to fall without warning.  Has some orthostatic lightheadedness.  No residual nausea or diaphoresis.   ? ?Continues with falls.  Most recently fell off of a stool and hit her head.  Falls when walking with her head looking down. ?  ?DATE TEST EF   ?2010 [facility]   % Normal ( by report)  ?4/22 MYOVIEW   75 % No ischemia  ?     ?     ? ?Date Cr K Hgb  ?3/23   1.3 4.5 14.9  ?      ? ? ? ?Complex medical regime.  Hemoglobin A1c has reached almost 9 ? ? ?Records and Results Reviewed  ? ?Past Medical History:  ?Diagnosis Date  ? Allergic rhinitis   ? Anxiety   ? Asthma   ? Carpal tunnel syndrome   ? Chronic pain syndrome   ? Depression   ? Diabetes mellitus without complication ([place])   ? Family history of long QT syndrome   ? GERD (gastroesophageal reflux disease)   ? Hyperlipidemia   ? Hypertension   ? IBS (irritable bowel syndrome)   ? Migraine 09/28/2015  ? Migraine headache   ? Non-cystic lesion of iris   ? Thyroid disease   ? hypothyroidism  ? ? ?Past Surgical History:  ?Procedure Laterality Date  ? CARPAL TUNNEL RELEASE  04/2008  ? COLPOSCOPY    ? KNEE SURGERY Bilateral   ? meniscal tear    ? MENISCUS REPAIR    ? right knee   ? MULTIPLE TOOTH EXTRACTIONS    ? VAGINAL HYSTERECTOMY    ? ? ?Current Meds  ?Medication Sig  ? ALPRAZolam (XANAX) 0.5 MG tablet Take 0.5 mg by mouth daily as needed.  ? aspirin 81 MG EC tablet Take 81 mg by mouth daily.  ? atenolol (TENORMIN) 100 MG tablet Take 1.5 tablets by mouth daily.  ?  canagliflozin (INVOKANA) 300 MG TABS tablet Take 300 mg by mouth daily before breakfast.  ? cyclobenzaprine (FLEXERIL) 10 MG tablet TAKE ONE TABLET BY MOUTH TWICE DAILY AS NEEDED FOR MUSCLE SPASM  ? gabapentin (NEURONTIN) 100 MG capsule Take by mouth. 3 tablets two times a day.  ? levothyroxine (SYNTHROID, LEVOTHROID) 50 MCG tablet Take 50 mcg by mouth daily.  ? losartan (COZAAR) 25 MG tablet Take by mouth.  ? montelukast (SINGULAIR) 10 MG tablet Take by mouth.  ? nystatin cream (MYCOSTATIN) Apply 1 application. topically 2 (two) times daily.  ? pioglitazone (ACTOS) 15 MG tablet Take 1 tablet by mouth daily.  ? RABEprazole (ACIPHEX) 20 MG tablet Take 20 mg by mouth daily.  ? traZODone (DESYREL) 50 MG tablet Take 1 tablet by mouth at bedtime.  ? ? ?Allergies  ?Allergen Reactions  ? Propoxyphene Other (See Comments)  ?  "can't remember"  ? Statins Other (See Comments)  ?  unspecified ?unspecified ?Unknown reaction ?  ? Benadryl [Diphenhydramine Hcl] Hives  ? Diflucan [Fluconazole, Injectable]   ? Fluconazole Diarrhea  ?  unspecified  ?  Imitrex [Sumatriptan Base] Other (See Comments)  ?  Tachycardia  ? Iodine Hives  ?  IV Dye, Iodine containing contrast media group ?  ? Iohexol   ? Metformin Diarrhea  ? Naprelan [Naproxen Sodium]   ?  rash  ? Provera [Medroxyprogesterone Acetate]   ? Prozac [Fluoxetine Hcl]   ? Amoxicillin-Pot Clavulanate Rash  ? Ceclor [Cefaclor] Rash  ? Clindamycin Rash and Hives  ? Epinephrine Palpitations  ? Erythromycin Rash  ? Sulfa Antibiotics Rash  ? Tetracyclines & Related Rash  ? ? ? ? ?Review of Systems negative except from HPI and PMH ? ?Physical Exam ?BP 100/60 (BP Location: Left Arm, Patient Position: Sitting, Cuff Size: Normal)   Pulse 91   Ht 5\' 6"  (1.676 m)   Wt 207 lb 4 oz (94 kg)   SpO2 98%   BMI 33.45 kg/m?  ?Well developed and nourished in no acute distress ?HENT normal ?Neck supple   ?Clear ?Regular rate and rhythm, no murmurs or gallops ?Abd-soft with active BS ?No Clubbing  cyanosis edema ?Skin-warm and dry ?A & Oriented  Grossly normal sensory and motor function ? ?ECG sinus at 91 ?Intervals 18/09/37 ?Inferior Q waves ? ? ?  ? ?CrCl cannot be calculated (No successful lab value found.). ? ? ?Assessment and  Plan ? ?Falls ? ?Hypertension ? ?QT prolongation ? ?Chest pain-atypical ? ?Contrast dye allergy ? ?Blood pressure is well controlled indeed low.   Last outpatient blood pressures were 0000000 and [id number] systolic so we will not make any adjustments today. ? ?Some palpitations; takes extra atenolol.  I do not think they are related to her QT prolongation; have encouraged her today in the context of her history of falls and the fact that she lives alone that she consider something like the Apple watch with fall alert technology; in the context of her palpitations, she could also use it for ECG recording ? ?Moreover, she has fallen off the back stairs a number of times.  She is thought about having getting a railing; has not done it yet.  Have encouraged her to follow through and to let us know that she has. ? ? ? ?

## 2021-06-10 ENCOUNTER — Other Ambulatory Visit: Payer: Self-pay | Admitting: Family Medicine

## 2021-06-10 DIAGNOSIS — M5416 Radiculopathy, lumbar region: Secondary | ICD-10-CM

## 2021-06-10 DIAGNOSIS — M4807 Spinal stenosis, lumbosacral region: Secondary | ICD-10-CM

## 2021-06-10 DIAGNOSIS — M5030 Other cervical disc degeneration, unspecified cervical region: Secondary | ICD-10-CM

## 2021-06-10 DIAGNOSIS — M5412 Radiculopathy, cervical region: Secondary | ICD-10-CM

## 2021-06-21 ENCOUNTER — Other Ambulatory Visit: Payer: Medicare PPO

## 2021-06-27 ENCOUNTER — Ambulatory Visit
Admission: RE | Admit: 2021-06-27 | Discharge: 2021-06-27 | Disposition: A | Discharge status: Home / Self Care | Payer: Medicare PPO | Source: Ambulatory Visit | Attending: Family Medicine | Admitting: Family Medicine

## 2021-06-27 DIAGNOSIS — M4807 Spinal stenosis, lumbosacral region: Secondary | ICD-10-CM

## 2021-06-27 DIAGNOSIS — M5416 Radiculopathy, lumbar region: Secondary | ICD-10-CM

## 2021-06-27 DIAGNOSIS — M5412 Radiculopathy, cervical region: Secondary | ICD-10-CM

## 2021-06-27 DIAGNOSIS — M5030 Other cervical disc degeneration, unspecified cervical region: Secondary | ICD-10-CM

## 2021-10-03 ENCOUNTER — Other Ambulatory Visit: Payer: Self-pay | Admitting: Internal Medicine

## 2021-10-03 DIAGNOSIS — Z1231 Encounter for screening mammogram for malignant neoplasm of breast: Secondary | ICD-10-CM

## 2021-10-10 ENCOUNTER — Telehealth: Payer: Self-pay | Admitting: Internal Medicine

## 2021-10-10 DIAGNOSIS — R0609 Other forms of dyspnea: Secondary | ICD-10-CM

## 2021-10-10 NOTE — Telephone Encounter (Signed)
Pt states she received a call from [place] about scheduling an echo. Please advise.

## 2021-10-14 NOTE — Telephone Encounter (Signed)
Scheduled sooner on 10/11 and added to waitlist Patient declined 09/26

## 2021-10-14 NOTE — Telephone Encounter (Signed)
Noted  

## 2021-10-14 NOTE — Telephone Encounter (Signed)
-----   Message from Eli Phillips sent at 10/11/2021  3:23 PM EDT ----- Regarding: FW: Please call patient and explain the reason she needs this echo. Order also needs to be entered ----- Message ----- From: Deboraha Sprang, MD Sent: 10/04/2021   3:24 PM EDT To: Adin Hector, MD; Eli Phillips Subject: RE:                                            Eustace Moore thanks   Sabrina.  Can you schedule this lady for an echo plz. Thx. SK ----- Message ----- From: Adin Hector, MD Sent: 10/04/2021   3:22 PM EDT To: Deboraha Sprang, MD  I saw our mutual patient, Ms. Huether, today for routine exam.  Amongst her many complaints, she reports significantly increased dyspnea on exertion, especially going uphill.  Her exam was unchanged here; labs were OK as well.  I've set her up for PFT's, but she needs an up to date functional cardiac study/echo (last myoview 4/22).  Is that something you want to set up on your end, or do you want [place] to get her to one of your partners?  Let me know, and I'll facilitate on this end.  Thank you!  Eustace Moore

## 2021-10-14 NOTE — Telephone Encounter (Signed)
I spoke with the patient regarding the reason for an echocardiogram being scheduled as per Dr. Ramonita Lab and Dr. Loyal Jacobson conversation below.  The patient voices understanding and is agreeable.  She advised that her dyspnea started after her last appointment with Dr. Caryl Comes. She gets SOB just walking up the stairs in her house. She has had 2 episodes of lightheadedness while walking in the grocery store.   I have advised the patient that pending the results of her echo & PFT's this will help determine a plan of care going forward.   The patient's echo is not scheduled until 11/08/21.  I advised the patient I will see if we can try to move this up to rule out a cardiac cause for her symptoms. Depending on the test results will determine if she needs to see Dr. Virl Axe once the test is complete.   I have also encouraged her to try to obtain a pulse oximeter for home to monitor her symptoms.   The patient voices understanding and is agreeable.  All questions were answered at this time.   Echo order placed.

## 2021-10-29 ENCOUNTER — Ambulatory Visit
Admission: RE | Admit: 2021-10-29 | Discharge: 2021-10-29 | Disposition: A | Discharge status: Home / Self Care | Payer: Medicare PPO | Source: Ambulatory Visit | Attending: Internal Medicine | Admitting: Internal Medicine

## 2021-10-29 DIAGNOSIS — Z1231 Encounter for screening mammogram for malignant neoplasm of breast: Secondary | ICD-10-CM | POA: Diagnosis present

## 2021-10-30 ENCOUNTER — Ambulatory Visit: Payer: Medicare PPO | Attending: Internal Medicine

## 2021-10-30 DIAGNOSIS — R0609 Other forms of dyspnea: Secondary | ICD-10-CM | POA: Diagnosis not present

## 2021-10-30 LAB — ECHOCARDIOGRAM COMPLETE
AR max vel: 2.14 cm2
AV Area VTI: 2.34 cm2
AV Area mean vel: 2.11 cm2
AV Mean grad: 4 mmHg
AV Peak grad: 8 mmHg
Ao pk vel: 1.41 m/s
Area-P 1/2: 4.33 cm2
Calc EF: 56.1 %
S' Lateral: 2.6 cm
Single Plane A2C EF: 56.9 %
Single Plane A4C EF: 56.3 %

## 2021-11-08 ENCOUNTER — Other Ambulatory Visit: Payer: Medicare PPO

## 2021-11-21 ENCOUNTER — Telehealth: Payer: Self-pay | Admitting: Internal Medicine

## 2021-11-21 NOTE — Telephone Encounter (Signed)
Deboraha Sprang, MD 11/16/2021  8:26 PM EDT     Please Inform Patient Echo showed  normal heart muscle function

## 2021-11-21 NOTE — Telephone Encounter (Signed)
I called and spoke with the patient regarding her echo results.  This was ordered based on a conversation from Dr. Ramonita Lab & Dr. Virl Axe in September stating:    10/14/21  9:00 AM Note ----- Message from Eli Phillips sent at 10/11/2021  3:23 PM EDT ----- Regarding: FW: Please call patient and explain the reason she needs this echo. Order also needs to be entered ----- Message ----- From: Deboraha Sprang, MD Sent: 10/04/2021   3:24 PM EDT To: Adin Hector, MD; Eli Phillips Subject: RE:                                             Shannon Flores thanks    Sabrina.  Can you schedule this lady for an echo plz. Thx. SK ----- Message ----- From: Adin Hector, MD Sent: 10/04/2021   3:22 PM EDT To: Deboraha Sprang, MD   I saw our mutual patient, Shannon Flores, today for routine exam.  Amongst her many complaints, she reports significantly increased dyspnea on exertion, especially going uphill.  Her exam was unchanged here; labs were OK as well.  I've set her up for PFT's, but she needs an up to date functional cardiac study/echo (last myoview 4/22).  Is that something you want to set up on your end, or do you want [place] to get her to one of your partners?  Let me know, and I'll facilitate on this end.   Thank you!   Shannon Flores        I inquired if the patient has had any PFT testing at this time and she advised that she has not. She is aware that I will forward a copy of her echo results to Dr. Ramonita Lab so he is aware these are complete and within the normal range.   The patient voices understanding and is agreeable.

## 2022-06-13 NOTE — Progress Notes (Unsigned)
Cardiology Office Note Date:  06/13/2022  Patient ID:  Shannon Flores, Shannon Flores 05-01-1950, MRN [id number] PCP:  Lynnea Ferrier, MD  Cardiologist:  None Electrophysiologist: Sherryl Manges, MD  ***refresh   Chief Complaint: ***  History of Present Illness: Shannon Flores is a 72 y.o. female with PMH notable for long QT syndrome, HTN, falls; seen today for Sherryl Manges, MD for routine electrophysiology followup.  She last saw Dr. Graciela Husbands 05/2021, was continuing to fall without warning. Has some orthostatic lightheadedness. Her BP was a little low in the office, but well controlled at recent visits, no med changes. Encouraged safety around the home - railings on all steps, fall-alert watch. The patient saw her PCP 09/2021, c/o increased DOE especially going uphill. Echo ordered  *** falls *** safety around home *** Bp readings at home   Since last being seen in our clinic the patient reports doing ***.  she denies chest pain, palpitations, dyspnea, PND, orthopnea, nausea, vomiting, dizziness, syncope, edema, weight gain, or early satiety.     Past Medical History:  Diagnosis Date   Allergic rhinitis    Anxiety    Asthma    Carpal tunnel syndrome    Chronic pain syndrome    Depression    Diabetes mellitus without complication (HCC)    Family history of long QT syndrome    GERD (gastroesophageal reflux disease)    Hyperlipidemia    Hypertension    IBS (irritable bowel syndrome)    Migraine 09/28/2015   Migraine headache    Non-cystic lesion of iris    Thyroid disease    hypothyroidism    Past Surgical History:  Procedure Laterality Date   CARPAL TUNNEL RELEASE  04/2008   COLPOSCOPY     KNEE SURGERY Bilateral    meniscal tear     MENISCUS REPAIR     right knee    MULTIPLE TOOTH EXTRACTIONS     VAGINAL HYSTERECTOMY      Current Outpatient Medications  Medication Instructions   albuterol (VENTOLIN HFA) 108 (90 Base) MCG/ACT inhaler 2 puffs, Every 6 hours PRN    ALPRAZolam (XANAX) 0.5 mg, Oral, Daily PRN   aspirin EC 81 mg, Oral, Daily,     atenolol (TENORMIN) 100 MG tablet 1.5 tablets, Oral, Daily   buPROPion (WELLBUTRIN) 100 mg, Oral, Every morning   canagliflozin (INVOKANA) 300 mg, Oral, Daily before breakfast   cyclobenzaprine (FLEXERIL) 10 MG tablet TAKE ONE TABLET BY MOUTH TWICE DAILY AS NEEDED FOR MUSCLE SPASM   diphenhydrAMINE (BENADRYL) 25 mg, Every 6 hours PRN   gabapentin (NEURONTIN) 100 MG capsule Oral, 3 tablets two times a day.    levothyroxine (SYNTHROID) 50 mcg, Oral, Daily,     losartan (COZAAR) 25 MG tablet Oral   montelukast (SINGULAIR) 10 MG tablet Oral   nystatin cream (MYCOSTATIN) 1 application , Topical, 2 times daily   pioglitazone (ACTOS) 15 MG tablet 1 tablet, Oral, Daily   RABEprazole (ACIPHEX) 20 mg, Oral, Daily,     traZODone (DESYREL) 50 MG tablet 1 tablet, Oral, Nightly    Social History:  The patient  reports that she has never smoked. She has never used smokeless tobacco. She reports that she does not drink alcohol and does not use drugs.   Family History:  *** include only if pertinent The patient's family history includes Diabetes in her father; Heart defect in her son; Hypertension in an other family member; Lung cancer in an other family member; Migraines in  her brother, maternal grandfather, mother, sister, and sister; Other in her mother.***  ROS:  Please see the history of present illness. All other systems are reviewed and otherwise negative.   PHYSICAL EXAM: *** VS:  There were no vitals taken for this visit. BMI: There is no height or weight on file to calculate BMI.  GEN- The patient is well appearing, alert and oriented x 3 today.   Lungs- Clear to ausculation bilaterally, normal work of breathing.  Heart- {Blank single:19197::"Regular","Irregularly irregular"} rate and rhythm, no murmurs, rubs or gallops Extremities- {EDEMA LEVEL:28147::"No"} peripheral edema, warm, dry   EKG is ordered. Personal  review of EKG from today shows:  ***  Recent Labs: No results found for requested labs within last 365 days.  No results found for requested labs within last 365 days.   CrCl cannot be calculated (No successful lab value found.).   Wt Readings from Last 3 Encounters:  05/23/21 207 lb 4 oz (94 kg)  05/03/20 200 lb (90.7 kg)  05/06/18 185 lb (83.9 kg)     Additional studies reviewed include: Previous EP, cardiology notes.   TTE, 10/30/2021  1. Left ventricular ejection fraction, by estimation, is 60 to 65%. The left ventricle has normal function. The left ventricle has no regional wall motion abnormalities. Left ventricular diastolic parameters are consistent with Grade I diastolic dysfunction (impaired relaxation).   2. Right ventricular systolic function is normal. The right ventricular size is normal.   3. The mitral valve is normal in structure. No evidence of mitral valve regurgitation.   4. The aortic valve is normal in structure. Aortic valve regurgitation is not visualized.   5. The inferior vena cava is normal in size with greater than 50% respiratory variability, suggesting right atrial pressure of 3 mmHg.   NM myocardial perfusion, 05/16/2020 There was no ST segment deviation noted during stress. The study is normal. This is a low risk study. The left ventricular ejection fraction is hyperdynamic (EF is 75%). There is no evidence for ischemia  ASSESSMENT AND PLAN:  #) QT prolongation #) falls #) HTN   #) ***    Current medicines are reviewed at length with the patient today.   The patient {ACTIONS; HAS/DOES NOT HAVE:19233} concerns regarding her medicines.  The following changes were made today:  {NONE DEFAULTED:18576}  Labs/ tests ordered today include: *** No orders of the defined types were placed in this encounter.    Disposition: Follow up with {EPMDS:28135} in {EPFOLLOW UP:28173}   Signed, Sherie Don, NP  06/13/22  8:56 AM   Electrophysiology [facility]

## 2022-06-17 ENCOUNTER — Encounter: Payer: Self-pay | Admitting: Cardiology

## 2022-06-17 ENCOUNTER — Telehealth (HOSPITAL_COMMUNITY): Payer: Self-pay | Admitting: Licensed Clinical Social Worker

## 2022-06-17 ENCOUNTER — Ambulatory Visit: Payer: Medicare PPO | Attending: Cardiology | Admitting: Cardiology

## 2022-06-17 VITALS — BP 110/70 | HR 80 | Ht 66.0 in | Wt 206.0 lb

## 2022-06-17 DIAGNOSIS — I4581 Long QT syndrome: Secondary | ICD-10-CM | POA: Diagnosis not present

## 2022-06-17 DIAGNOSIS — R296 Repeated falls: Secondary | ICD-10-CM | POA: Diagnosis not present

## 2022-06-17 DIAGNOSIS — I10 Essential (primary) hypertension: Secondary | ICD-10-CM | POA: Diagnosis not present

## 2022-06-17 NOTE — Patient Instructions (Signed)
Medication Instructions:  Your physician recommends that you continue on your current medications as directed. Please refer to the Current Medication list given to you today.  *If you need a refill on your cardiac medications before your next appointment, please call your pharmacy*   Lab Work: No labs ordered  If you have labs (blood work) drawn today and your tests are completely normal, you will receive your results only by: MyChart Message (if you have MyChart) OR A paper copy in the mail If you have any lab test that is abnormal or we need to change your treatment, we will call you to review the results.   Testing/Procedures: No testing ordered  Follow-Up: At [facility], you and your health needs are our priority.  As part of our continuing mission to provide you with exceptional heart care, we have created designated Provider Care Teams.  These Care Teams include your primary Cardiologist (physician) and Advanced Practice Providers (APPs -  Physician Assistants and Nurse Practitioners) who all work together to provide you with the care you need, when you need it.  We recommend signing up for the patient portal called "MyChart".  Sign up information is provided on this After Visit Summary.  MyChart is used to connect with patients for Virtual Visits (Telemedicine).  Patients are able to view lab/test results, encounter notes, upcoming appointments, etc.  Non-urgent messages can be sent to your provider as well.   To learn more about what you can do with MyChart, go to [email or web address].    Your next appointment:   1 year(s)  Provider:   Steven Klein, MD  

## 2022-06-17 NOTE — Telephone Encounter (Signed)
H&V Care Navigation CSW Progress Note  Clinical Social Worker consulted to speak with pt regarding home safety concerns and food access concerns.  CSW attempted to call pt to discuss- unable to reach- left VM requesting return call   SDOH Screenings   Food Insecurity: Food Insecurity Present (03/30/2018)  Transportation Needs: Unmet Transportation Needs (03/30/2018)  Financial Resource Strain: Medium Risk (03/30/2018)  Physical Activity: Insufficiently Active (03/30/2018)  Social Connections: Unknown (03/30/2018)  Stress: Stress Concern Present (03/30/2018)  Tobacco Use: Low Risk  (06/17/2022)   Burna Sis, LCSW Clinical Social Worker [facility] Desk#: [phone] Cell#: [phone]

## 2022-06-18 ENCOUNTER — Telehealth (HOSPITAL_COMMUNITY): Payer: Self-pay | Admitting: Licensed Clinical Social Worker

## 2022-06-18 NOTE — Progress Notes (Signed)
Heart and Vascular Care Navigation  06/18/2022  Shannon Flores 1950/06/20 [id number]  Reason for Referral: concerns with home safety and food insecurity   Engaged with patient by telephone for initial visit for Heart and Vascular Care Coordination.                                                                                                   Assessment: CSW spoke with pt regarding above concerns.  Pt lives in home by herself.  Has a dtr who lives in [place] and a son who lives in [place] area.  Reports they are not able to visit her often due to work and childcare. Has a neighbor who will help her when she calls.  Pt admits to frequent falls with minor injuries at home.  Uses a cane to walk around sometimes but isn't consistent- understands this has helped her avoid falls in the past.  Is also careful about bringing phone around with her.  Admits to avoiding showering due to fears of falling in the shower.  Has started using the front door to exit the house because there is a railing there.   Has had in home help in the past with someone she knows but they aren't doing this anymore.  Doesn't feel comfortable going through an agency to get assistance since it would be a stranger in her home.  Dtr has been talking to her about going to ALF but she states her mom was in SNF for 11 years and she saw the care she got there so is hopeful to remain living independently as long as possible.  CSW sent message to PCP to request help getting a walker and shower chair ordered for patient to help prevent further falls.  CSW then inquired about food insecurity as pt had reported just eating ritz crackers for a meal.  Pt reports she has no issues getting food because her daughter orders it for her through [employer].  States dtr has been sick recently which lead to a delay in delivery.  Pt drives when she needs to but had cataracts so she is often uncomfortable doing so.  Has avoided getting shots for  her back due to lack of transportation because the medicine they would give her she isn't supposed to drive after.  CSW discussed transportation benefit through insurance- pt was unaware of this benefit and will plan to call them to discuss further.  Also informed of [employer] as another option for transport when needed.  During conversation pt expressed some concerns with mental health.  States she sees a Warden/ranger every 3 weeks to discuss concerns and that this is helpful but she still feels down and lacking motivation.  Has history of several traumatic events in her family including the death of her first son which are difficult for her.  Is on xanax and trazodone for sleep but nothing during the day.  Was on celexa and wellbutrin and felt like this helped but stopped taking- unclear why she stopped taking.  Encouraged her to talk to PCP about if this would make  sense to start again.    Pt feels very isolated.  Had strong church community but doesn't like the current minister so has lost contact.  CSW discussed her reaching out to them to discuss home visits or virtual ministries.  Also discussed local senior resource center and provided phone number.  Pt feeling very overwhelmed by her many problems at this time.  CSW asked pt to pick the one current problem that is most bothering her and she states that she can't find any summer clothing because someone stuffed them in bag to try and declutter her home so she wouldn't trip.  CSW discussed trying to tackle this problem this week and going through one bag a day to try and find her missing clothing and then to stop after that one bag if she wants.  She likes this idea and will plan to start this.  CSW will plan to check in next week regarding her progress in these matters. HRT/VAS Care Coordination     Patients Home Cardiology Office [facility]   Outpatient Care Team Social Worker   Social Worker Name: [name], Heart and Vascular,  [phone]   Living arrangements for the past 2 months Single Family Home   Lives with: Self   Patient Current Insurance Coverage Managed Medicare   Patient Has Concern With Paying Medical Bills No   Does Patient Have Prescription Coverage? Yes       Social History:                                                                             SDOH Screenings   Food Insecurity: No Food Insecurity (06/18/2022)  Housing: Low Risk  (06/18/2022)  Transportation Needs: Unmet Transportation Needs (06/18/2022)  Financial Resource Strain: Medium Risk (03/30/2018)  Physical Activity: Insufficiently Active (03/30/2018)  Social Connections: Unknown (03/30/2018)  Stress: Stress Concern Present (03/30/2018)  Tobacco Use: Low Risk  (06/17/2022)    SDOH Interventions: Financial Resources:    Not assessed- no concerns expressed  Food Insecurity:  Food Insecurity Interventions: Intervention Not Indicated  Housing Insecurity:  Housing Interventions: Intervention Not Indicated  Transportation:   Drives sometimes but somewhat wary- CSW informed of potential payor source transportation and [employer]    Follow-up plan:    PCP hopefully to order walker and shower chair  Pt to call insurance about transport and work on setting up injections  Pt to call church and/or senior resources to start establishing social connections  CSW to follow up with pt next week to check in  Burna Sis, LCSW Clinical Social Worker [facility] Desk#: [phone] Cell#: [phone]

## 2022-06-26 ENCOUNTER — Telehealth (HOSPITAL_COMMUNITY): Payer: Self-pay | Admitting: Licensed Clinical Social Worker

## 2022-06-26 NOTE — Telephone Encounter (Signed)
CSW called pt to check in on concerns expressed last week- unable to reach- left message requesting return call  Burna Sis, LCSW Clinical Social Worker [facility] Desk#: [phone] Cell#: [phone]

## 2022-07-02 ENCOUNTER — Telehealth (HOSPITAL_COMMUNITY): Payer: Self-pay | Admitting: Licensed Clinical Social Worker

## 2022-07-04 NOTE — Telephone Encounter (Signed)
H&V Care Navigation CSW Progress Note  Clinical Social Worker checked in with patient.  Doing ok still has not reached out to PCP so CSW assisted in contacting to get follow up on concerns- will have appt in July.   SDOH Screenings   Food Insecurity: No Food Insecurity (06/18/2022)  Housing: Low Risk  (06/18/2022)  Transportation Needs: Unmet Transportation Needs (06/18/2022)  Financial Resource Strain: Medium Risk (03/30/2018)  Physical Activity: Insufficiently Active (03/30/2018)  Social Connections: Unknown (03/30/2018)  Stress: Stress Concern Present (03/30/2018)  Tobacco Use: Low Risk  (06/17/2022)       Burna Sis, LCSW Clinical Social Worker [facility] Desk#: [phone] Cell#: [phone]

## 2022-12-15 ENCOUNTER — Ambulatory Visit: Payer: Medicare PPO | Admitting: Urology

## 2023-01-07 ENCOUNTER — Other Ambulatory Visit: Payer: Self-pay | Admitting: Internal Medicine

## 2023-01-07 DIAGNOSIS — Z1231 Encounter for screening mammogram for malignant neoplasm of breast: Secondary | ICD-10-CM

## 2023-01-16 ENCOUNTER — Other Ambulatory Visit: Payer: Self-pay | Admitting: Internal Medicine

## 2023-01-16 DIAGNOSIS — R413 Other amnesia: Secondary | ICD-10-CM

## 2023-01-16 DIAGNOSIS — I10 Essential (primary) hypertension: Secondary | ICD-10-CM

## 2023-01-28 ENCOUNTER — Ambulatory Visit
Admission: RE | Admit: 2023-01-28 | Discharge: 2023-01-28 | Disposition: A | Discharge status: Home / Self Care | Payer: Medicare PPO | Source: Ambulatory Visit | Attending: Internal Medicine | Admitting: Internal Medicine

## 2023-01-28 DIAGNOSIS — R413 Other amnesia: Secondary | ICD-10-CM | POA: Diagnosis present

## 2023-01-28 DIAGNOSIS — I10 Essential (primary) hypertension: Secondary | ICD-10-CM | POA: Insufficient documentation

## 2023-02-23 ENCOUNTER — Ambulatory Visit: Payer: Self-pay | Admitting: Urology

## 2023-03-02 ENCOUNTER — Ambulatory Visit: Payer: Medicare PPO | Admitting: Urology

## 2023-03-02 VITALS — BP 143/70 | HR 70

## 2023-03-02 DIAGNOSIS — N3946 Mixed incontinence: Secondary | ICD-10-CM | POA: Diagnosis not present

## 2023-03-02 LAB — URINALYSIS, COMPLETE
Bilirubin, UA: NEGATIVE
Nitrite, UA: NEGATIVE
Protein,UA: NEGATIVE
RBC, UA: NEGATIVE
Specific Gravity, UA: 1.015 (ref 1.005–1.030)
Urobilinogen, Ur: 0.2 mg/dL (ref 0.2–1.0)
pH, UA: 6 (ref 5.0–7.5)

## 2023-03-02 LAB — MICROSCOPIC EXAMINATION

## 2023-03-02 MED ORDER — GEMTESA 75 MG PO TABS
75.0000 mg | ORAL_TABLET | Freq: Every day | ORAL | 0 refills | Status: AC
Start: 2023-03-02 — End: ?

## 2023-03-02 NOTE — Progress Notes (Signed)
 03/02/2023 3:28 PM   Shannon Flores October 05, 1950 [id number]  Referring provider: Melchor Spoon, MD [address] [facility] [place],  [place] [address]  Chief Complaint  Patient presents with   mixed incontinence and stress    HPI: I was consulted to assist the patient is urinary incontinence.  I think you primary symptom is urge incontinence.  She may sometimes leaks with coughing sneezing bending and lifting.  Some of the details were more challenging.  She can have moderately severe bedwetting.  She can wear 4-5 pads a day moderately wet to soak.  She voids every 2-3 hours and sometimes gets up once at night to urinate.  Flow was reasonable  She has not had a hysterectomy is prone to constipation  May have been treated with a pill 20 years ago  No history of bladder infections kidney stones or bladder surgery.  No neurologic issues   PMH: Past Medical History:  Diagnosis Date   Allergic rhinitis    Anxiety    Asthma    Carpal tunnel syndrome    Chronic pain syndrome    Depression    Diabetes mellitus without complication (HCC)    Family history of long QT syndrome    GERD (gastroesophageal reflux disease)    Hyperlipidemia    Hypertension    IBS (irritable bowel syndrome)    Migraine 09/28/2015   Migraine headache    Non-cystic lesion of iris    Thyroid disease    hypothyroidism    Surgical History: Past Surgical History:  Procedure Laterality Date   CARPAL TUNNEL RELEASE  04/2008   COLPOSCOPY     KNEE SURGERY Bilateral    meniscal tear     MENISCUS REPAIR     right knee    MULTIPLE TOOTH EXTRACTIONS     VAGINAL HYSTERECTOMY      Home Medications:  Allergies as of 03/02/2023       Reactions   Propoxyphene Other (See Comments)   "can't remember"   Statins Other (See Comments)   unspecified unspecified Unknown reaction   Benadryl [diphenhydramine Hcl] Hives   Diflucan [fluconazole, Injectable]    Fluconazole Diarrhea    unspecified   Imitrex [sumatriptan Base] Other (See Comments)   Tachycardia   Iodine Hives   IV Dye, Iodine containing contrast media group   Iohexol    Metformin Diarrhea   Naprelan [naproxen Sodium]    rash   Provera [medroxyprogesterone Acetate]    Prozac [fluoxetine Hcl]    Amoxicillin-pot Clavulanate Rash   Ceclor [cefaclor] Rash   Clindamycin Rash, Hives   Epinephrine Palpitations   Erythromycin Rash   Sulfa Antibiotics Rash   Tetracyclines & Related Rash        Medication List        Accurate as of March 02, 2023  3:28 PM. If you have any questions, ask your nurse or doctor.          albuterol 108 (90 Base) MCG/ACT inhaler Commonly known as: VENTOLIN HFA Inhale 2 puffs into the lungs every 6 (six) hours as needed.   ALPRAZolam 0.5 MG tablet Commonly known as: XANAX Take 0.5 mg by mouth daily as needed.   aspirin EC 81 MG tablet Take 81 mg by mouth daily.   atenolol 100 MG tablet Commonly known as: TENORMIN Take 1.5 tablets by mouth daily.   canagliflozin 300 MG Tabs tablet Commonly known as: INVOKANA Take 300 mg by mouth daily before breakfast.  cyclobenzaprine 10 MG tablet Commonly known as: FLEXERIL TAKE ONE TABLET BY MOUTH TWICE DAILY AS NEEDED FOR MUSCLE SPASM   diphenhydrAMINE 25 MG tablet Commonly known as: BENADRYL Take 25 mg by mouth every 6 (six) hours as needed.   gabapentin  100 MG capsule Commonly known as: NEURONTIN  Take by mouth. 3 tablets two times a day.   levothyroxine 50 MCG tablet Commonly known as: SYNTHROID Take 50 mcg by mouth daily.   losartan 25 MG tablet Commonly known as: COZAAR Take by mouth.   montelukast 10 MG tablet Commonly known as: SINGULAIR Take by mouth.   nystatin cream Commonly known as: MYCOSTATIN Apply 1 application. topically 2 (two) times daily.   RABEprazole 20 MG tablet Commonly known as: ACIPHEX Take 20 mg by mouth daily.   traZODone 50 MG tablet Commonly known as: DESYREL Take 1  tablet by mouth at bedtime.        Allergies:  Allergies  Allergen Reactions   Propoxyphene Other (See Comments)    "can't remember"   Statins Other (See Comments)    unspecified unspecified Unknown reaction    Benadryl [Diphenhydramine Hcl] Hives   Diflucan [Fluconazole, Injectable]    Fluconazole Diarrhea    unspecified   Imitrex [Sumatriptan Base] Other (See Comments)    Tachycardia   Iodine Hives    IV Dye, Iodine containing contrast media group    Iohexol    Metformin Diarrhea   Naprelan [Naproxen Sodium]     rash   Provera [Medroxyprogesterone Acetate]    Prozac [Fluoxetine Hcl]    Amoxicillin-Pot Clavulanate Rash   Ceclor [Cefaclor] Rash   Clindamycin Rash and Hives   Epinephrine Palpitations   Erythromycin Rash   Sulfa Antibiotics Rash   Tetracyclines & Related Rash    Family History: Family History  Problem Relation Age of Onset   Diabetes Father    Hypertension Other    Lung cancer Other    Heart defect Son        Passed suddenly at age 72   Other Mother        Phlebitis   Migraines Mother    Migraines Sister    Migraines Brother    Migraines Maternal Grandfather    Migraines Sister     Social History:  reports that she has never smoked. She has never used smokeless tobacco. She reports that she does not drink alcohol and does not use drugs.  ROS:                                        Physical Exam: There were no vitals taken for this visit.  Constitutional:  Alert and oriented, No acute distress. HEENT: [place] AT, moist mucus membranes.  Trachea midline, no masses.  Laboratory Data: No results found for: "WBC", "HGB", "HCT", "MCV", "PLT"  No results found for: "CREATININE"  No results found for: "PSA"  No results found for: "TESTOSTERONE"  No results found for: "HGBA1C"  Urinalysis No results found for: "COLORURINE", "APPEARANCEUR", "LABSPEC", "PHURINE", "GLUCOSEU", "HGBUR", "BILIRUBINUR", "KETONESUR",  "PROTEINUR", "UROBILINOGEN", "NITRITE", "LEUKOCYTESUR"  Pertinent Imaging: Urine reviewed and sent for culture.  Chart reviewed  Assessment & Plan: Patient has mixed incontinence but likely urge incontinence and bedwetting.  She has frequency and nocturia.  Return for pelvic examination and cystoscopy on [place]  in 6 weeks.  Call if culture positive.  She understands she may need a test  in the future without detail.  1. Mixed incontinence urge and stress (Primary)  - Urinalysis, Complete   No follow-ups on file.  Devorah Fonder, MD  [facility] [address] [place], [place] [address] [phone]

## 2023-03-06 LAB — CULTURE, URINE COMPREHENSIVE

## 2023-04-27 ENCOUNTER — Other Ambulatory Visit: Payer: Medicare PPO | Admitting: Urology

## 2023-07-14 ENCOUNTER — Emergency Department

## 2023-07-14 ENCOUNTER — Observation Stay
Admission: EM | Admit: 2023-07-14 | Discharge: 2023-07-27 | Disposition: A | Discharge status: Skilled Nursing Facility | Attending: Internal Medicine | Admitting: Internal Medicine

## 2023-07-14 ENCOUNTER — Other Ambulatory Visit: Payer: Self-pay

## 2023-07-14 DIAGNOSIS — Z794 Long term (current) use of insulin: Secondary | ICD-10-CM | POA: Insufficient documentation

## 2023-07-14 DIAGNOSIS — E039 Hypothyroidism, unspecified: Secondary | ICD-10-CM | POA: Diagnosis present

## 2023-07-14 DIAGNOSIS — I10 Essential (primary) hypertension: Secondary | ICD-10-CM | POA: Insufficient documentation

## 2023-07-14 DIAGNOSIS — S0012XA Contusion of left eyelid and periocular area, initial encounter: Secondary | ICD-10-CM | POA: Insufficient documentation

## 2023-07-14 DIAGNOSIS — M25531 Pain in right wrist: Secondary | ICD-10-CM

## 2023-07-14 DIAGNOSIS — F3341 Major depressive disorder, recurrent, in partial remission: Secondary | ICD-10-CM | POA: Diagnosis not present

## 2023-07-14 DIAGNOSIS — S0993XA Unspecified injury of face, initial encounter: Principal | ICD-10-CM

## 2023-07-14 DIAGNOSIS — F419 Anxiety disorder, unspecified: Secondary | ICD-10-CM | POA: Diagnosis not present

## 2023-07-14 DIAGNOSIS — Z7982 Long term (current) use of aspirin: Secondary | ICD-10-CM | POA: Insufficient documentation

## 2023-07-14 DIAGNOSIS — E1165 Type 2 diabetes mellitus with hyperglycemia: Secondary | ICD-10-CM | POA: Diagnosis present

## 2023-07-14 DIAGNOSIS — S60222A Contusion of left hand, initial encounter: Secondary | ICD-10-CM

## 2023-07-14 DIAGNOSIS — S60212A Contusion of left wrist, initial encounter: Secondary | ICD-10-CM | POA: Diagnosis not present

## 2023-07-14 DIAGNOSIS — N390 Urinary tract infection, site not specified: Secondary | ICD-10-CM | POA: Insufficient documentation

## 2023-07-14 DIAGNOSIS — S60221A Contusion of right hand, initial encounter: Secondary | ICD-10-CM | POA: Diagnosis not present

## 2023-07-14 DIAGNOSIS — G8929 Other chronic pain: Secondary | ICD-10-CM | POA: Insufficient documentation

## 2023-07-14 DIAGNOSIS — R55 Syncope and collapse: Secondary | ICD-10-CM | POA: Diagnosis present

## 2023-07-14 DIAGNOSIS — I4581 Long QT syndrome: Secondary | ICD-10-CM | POA: Diagnosis present

## 2023-07-14 DIAGNOSIS — S0512XA Contusion of eyeball and orbital tissues, left eye, initial encounter: Secondary | ICD-10-CM

## 2023-07-14 DIAGNOSIS — S0083XA Contusion of other part of head, initial encounter: Secondary | ICD-10-CM | POA: Diagnosis not present

## 2023-07-14 DIAGNOSIS — J45909 Unspecified asthma, uncomplicated: Secondary | ICD-10-CM | POA: Insufficient documentation

## 2023-07-14 DIAGNOSIS — R58 Hemorrhage, not elsewhere classified: Secondary | ICD-10-CM

## 2023-07-14 DIAGNOSIS — Z8659 Personal history of other mental and behavioral disorders: Secondary | ICD-10-CM

## 2023-07-14 DIAGNOSIS — S5010XA Contusion of unspecified forearm, initial encounter: Secondary | ICD-10-CM | POA: Insufficient documentation

## 2023-07-14 DIAGNOSIS — W19XXXA Unspecified fall, initial encounter: Secondary | ICD-10-CM | POA: Diagnosis not present

## 2023-07-14 DIAGNOSIS — G894 Chronic pain syndrome: Secondary | ICD-10-CM | POA: Diagnosis present

## 2023-07-14 DIAGNOSIS — Z8709 Personal history of other diseases of the respiratory system: Secondary | ICD-10-CM

## 2023-07-14 DIAGNOSIS — S60219A Contusion of unspecified wrist, initial encounter: Secondary | ICD-10-CM

## 2023-07-14 LAB — TROPONIN I (HIGH SENSITIVITY)
Troponin I (High Sensitivity): 15 ng/L (ref <18)
Troponin I (High Sensitivity): 10 ng/L (ref <18)

## 2023-07-14 LAB — URINALYSIS, ROUTINE W REFLEX MICROSCOPIC
Bilirubin Urine: NEGATIVE
Glucose, UA: >=500 mg/dL — AB
Hgb urine dipstick: NEGATIVE
Ketones, ur: NEGATIVE mg/dL
Leukocytes,Ua: NEGATIVE
Nitrite: NEGATIVE
Protein, ur: NEGATIVE mg/dL
Specific Gravity, Urine: 1.014 (ref 1.005–1.030)
Squamous Epithelial / HPF: 0 /HPF (ref 0–5)
pH: 5 (ref 5.0–8.0)

## 2023-07-14 LAB — BASIC METABOLIC PANEL WITH GFR
Anion gap: 12 (ref 5–15)
BUN: 16 mg/dL (ref 8–23)
CO2: 19 mmol/L — ABNORMAL LOW (ref 22–32)
Calcium: 8.9 mg/dL (ref 8.9–10.3)
Chloride: 105 mmol/L (ref 98–111)
Creatinine, Ser: 1 mg/dL (ref 0.44–1.00)
GFR, Estimated: 59 mL/min — ABNORMAL LOW (ref >60)
Glucose, Bld: 174 mg/dL — ABNORMAL HIGH (ref 70–99)
Potassium: 3.5 mmol/L (ref 3.5–5.1)
Sodium: 136 mmol/L (ref 135–145)

## 2023-07-14 LAB — CBC
HCT: 44.1 % (ref 36.0–46.0)
Hemoglobin: 14.9 g/dL (ref 12.0–15.0)
MCH: 30.2 pg (ref 26.0–34.0)
MCHC: 33.8 g/dL (ref 30.0–36.0)
MCV: 89.5 fL (ref 80.0–100.0)
Platelets: 172 10*3/uL (ref 150–400)
RBC: 4.93 MIL/uL (ref 3.87–5.11)
RDW: 12.2 % (ref 11.5–15.5)
WBC: 6.9 10*3/uL (ref 4.0–10.5)
nRBC: 0 % (ref 0.0–0.2)

## 2023-07-14 LAB — MAGNESIUM: Magnesium: 1.9 mg/dL (ref 1.7–2.4)

## 2023-07-14 LAB — GLUCOSE, CAPILLARY: Glucose-Capillary: 120 mg/dL — ABNORMAL HIGH (ref 70–99)

## 2023-07-14 MED ORDER — ASPIRIN 81 MG PO TBEC
81.0000 mg | DELAYED_RELEASE_TABLET | Freq: Every day | ORAL | Status: DC
  Administered 2023-07-15 – 2023-07-27 (×13): 81 mg via ORAL
  Filled 2023-07-14 (×13): qty 1

## 2023-07-14 MED ORDER — ATENOLOL 100 MG PO TABS
100.0000 mg | ORAL_TABLET | Freq: Every day | ORAL | Status: DC
  Administered 2023-07-15 – 2023-07-27 (×13): 100 mg via ORAL
  Filled 2023-07-14: qty 4
  Filled 2023-07-14: qty 1
  Filled 2023-07-14: qty 4
  Filled 2023-07-14 (×8): qty 1
  Filled 2023-07-14: qty 4
  Filled 2023-07-14 (×4): qty 1

## 2023-07-14 MED ORDER — HEPARIN SODIUM (PORCINE) 5000 UNIT/ML IJ SOLN
5000.0000 [IU] | Freq: Three times a day (TID) | INTRAMUSCULAR | Status: DC
  Administered 2023-07-14 – 2023-07-27 (×37): 5000 [IU] via SUBCUTANEOUS
  Filled 2023-07-14 (×37): qty 1

## 2023-07-14 MED ORDER — INSULIN ASPART 100 UNIT/ML IJ SOLN
0.0000 [IU] | Freq: Three times a day (TID) | INTRAMUSCULAR | Status: DC
  Administered 2023-07-15: 2 [IU] via SUBCUTANEOUS
  Administered 2023-07-15: 3 [IU] via SUBCUTANEOUS
  Administered 2023-07-15: 2 [IU] via SUBCUTANEOUS
  Administered 2023-07-16: 3 [IU] via SUBCUTANEOUS
  Administered 2023-07-16: 2 [IU] via SUBCUTANEOUS
  Administered 2023-07-17 – 2023-07-18 (×5): 3 [IU] via SUBCUTANEOUS
  Filled 2023-07-14 (×10): qty 1

## 2023-07-14 MED ORDER — TRAZODONE HCL 50 MG PO TABS
50.0000 mg | ORAL_TABLET | Freq: Every day | ORAL | Status: DC
  Administered 2023-07-14 – 2023-07-26 (×13): 50 mg via ORAL
  Filled 2023-07-14 (×13): qty 1

## 2023-07-14 MED ORDER — LOSARTAN POTASSIUM 25 MG PO TABS
25.0000 mg | ORAL_TABLET | Freq: Every day | ORAL | Status: DC
  Administered 2023-07-15 – 2023-07-27 (×13): 25 mg via ORAL
  Filled 2023-07-14 (×13): qty 1

## 2023-07-14 MED ORDER — PANTOPRAZOLE SODIUM 40 MG PO TBEC
40.0000 mg | DELAYED_RELEASE_TABLET | Freq: Every day | ORAL | Status: DC
  Administered 2023-07-15 – 2023-07-27 (×13): 40 mg via ORAL
  Filled 2023-07-14 (×13): qty 1

## 2023-07-14 MED ORDER — ONDANSETRON HCL 4 MG/2ML IJ SOLN: 4.0000 mg | Freq: Four times a day (QID) | INTRAMUSCULAR | Status: AC | PRN

## 2023-07-14 MED ORDER — ALPRAZOLAM 0.5 MG PO TABS
0.5000 mg | ORAL_TABLET | Freq: Every day | ORAL | Status: DC | PRN
  Administered 2023-07-14 – 2023-07-25 (×11): 0.5 mg via ORAL
  Filled 2023-07-14 (×11): qty 1

## 2023-07-14 MED ORDER — ALBUTEROL SULFATE (2.5 MG/3ML) 0.083% IN NEBU: 2.5000 mg | INHALATION_SOLUTION | Freq: Four times a day (QID) | RESPIRATORY_TRACT | Status: DC | PRN

## 2023-07-14 MED ORDER — NITROFURANTOIN MONOHYD MACRO 100 MG PO CAPS
100.0000 mg | ORAL_CAPSULE | Freq: Two times a day (BID) | ORAL | Status: AC
  Administered 2023-07-14 – 2023-07-16 (×5): 100 mg via ORAL
  Filled 2023-07-14 (×5): qty 1

## 2023-07-14 MED ORDER — INSULIN ASPART 100 UNIT/ML IJ SOLN
0.0000 [IU] | Freq: Every day | INTRAMUSCULAR | Status: DC
  Administered 2023-07-15: 2 [IU] via SUBCUTANEOUS
  Filled 2023-07-14 (×2): qty 1

## 2023-07-14 MED ORDER — SENNOSIDES-DOCUSATE SODIUM 8.6-50 MG PO TABS: 1.0000 | ORAL_TABLET | Freq: Every evening | ORAL | Status: DC | PRN

## 2023-07-14 MED ORDER — ONDANSETRON HCL 4 MG PO TABS: 4.0000 mg | ORAL_TABLET | Freq: Four times a day (QID) | ORAL | Status: AC | PRN

## 2023-07-14 MED ORDER — ACETAMINOPHEN 325 MG PO TABS
650.0000 mg | ORAL_TABLET | Freq: Four times a day (QID) | ORAL | Status: AC | PRN
Start: 2023-07-14 — End: 2023-07-19
  Administered 2023-07-14 – 2023-07-18 (×8): 650 mg via ORAL
  Filled 2023-07-14 (×8): qty 2

## 2023-07-14 MED ORDER — LEVOTHYROXINE SODIUM 50 MCG PO TABS
25.0000 ug | ORAL_TABLET | Freq: Every day | ORAL | Status: DC
  Administered 2023-07-15 – 2023-07-27 (×13): 25 ug via ORAL
  Filled 2023-07-14 (×13): qty 1

## 2023-07-14 MED ORDER — ACETAMINOPHEN 500 MG PO TABS
1000.0000 mg | ORAL_TABLET | Freq: Once | ORAL | Status: AC
  Administered 2023-07-14: 1000 mg via ORAL
  Filled 2023-07-14: qty 2

## 2023-07-14 MED ORDER — SODIUM CHLORIDE 0.9% FLUSH
3.0000 mL | Freq: Two times a day (BID) | INTRAVENOUS | Status: DC
  Administered 2023-07-14 – 2023-07-26 (×24): 3 mL via INTRAVENOUS

## 2023-07-14 MED ORDER — ACETAMINOPHEN 650 MG RE SUPP: 650.0000 mg | Freq: Four times a day (QID) | RECTAL | Status: AC | PRN

## 2023-07-14 NOTE — ED Notes (Signed)
 Spoke with EDP Bradler, CT maxillofacial ordered. Getting bloodwork because +LOC and conflicting story as to whether CP or SOB.

## 2023-07-14 NOTE — Assessment & Plan Note (Addendum)
 Differentials include syncope, fall with concussion, hypoxia in setting of asphyxiation from food bolus Fall precaution Complete echo ordered

## 2023-07-14 NOTE — ED Provider Notes (Signed)
 [name] Provider Note    Event Date/Time   First MD Initiated Contact with Patient 07/14/23 1554     (approximate)   History   No chief complaint on file.  See first nurse Note.   HPI Shannon Flores is a 73 y.o. female PMH DM2, asthma, fibromyalgia, hypertension, GERD, prolonged QT presents for evaluation after fall - Patient was eating a peanut butter sandwich last night around 3 AM when she started choking and could not get the peanut butter sandwich out.  Attempted to run into her neighbor across the street but after opening the front door she subsequently woke up facedown on the stairs.  Fortunately the food bolus had spontaneously come out apparently during the fall and patient could again breathe.  Unclear how many minutes she was unconscious. - Complains of headache, face pain, right wrist/hand pain, left shoulder and chest wall pain, left hand pain.  Did drive herself here but feels she would now be unable to do so due to worsening pain in her right wrist. - Lives alone - Started treatment for UTI about 4 days ago        Physical Exam   Triage Vital Signs: ED Triage Vitals  Encounter Vitals Group     BP 07/14/23 1325 (!) 136/93     Girls Systolic BP Percentile --      Girls Diastolic BP Percentile --      Boys Systolic BP Percentile --      Boys Diastolic BP Percentile --      Pulse Rate 07/14/23 1325 88     Resp 07/14/23 1325 17     Temp 07/14/23 1325 99.7 F (37.6 C)     Temp src --      SpO2 07/14/23 1325 94 %     Weight 07/14/23 1327 205 lb 14.6 oz (93.4 kg)     Height 07/14/23 1327 5' 6 (1.676 m)     Head Circumference --      Peak Flow --      Pain Score 07/14/23 1326 9     Pain Loc --      Pain Education --      Exclude from Growth Chart --     Most recent vital signs: Vitals:   07/14/23 1325 07/14/23 1904  BP: (!) 136/93 128/80  Pulse: 88 72  Resp: 17 18  Temp: 99.7 F (37.6 C) 98 F (36.7 C)  SpO2: 94% 96%      General: Awake, no distress.  HEENT: Notable bruising and superficial abrasions to nose, left face CV:  Good peripheral perfusion. RRR, RP 2+. +abrasions to L chest wall, +ttp, no deformity. Resp:  Normal effort. CTAB Abd:  No distention. Nontender to deep palpation throughout Pelvis: Stable Other:  Tender to palpation over left proximal humerus as well as left hand.  Some bruising on left hand.  No deformities appreciated.  RP 2+.  Tender to palpation over right wrist and somewhat over hand, mild swelling around wrist, RP 2+.  No tenderness to palpation elsewhere in upper and lower extremities.  Full range of motion of bilateral lower extremities.  No limb length discrepancy or abnormal rotation.   ED Results / Procedures / Treatments   Labs (all labs ordered are listed, but only abnormal results are displayed) Labs Reviewed  BASIC METABOLIC PANEL WITH GFR - Abnormal; Notable for the following components:      Result Value   CO2 19 (*)  Glucose, Bld 174 (*)    GFR, Estimated 59 (*)    All other components within normal limits  CBC  URINALYSIS, ROUTINE W REFLEX MICROSCOPIC  BASIC METABOLIC PANEL WITH GFR  CBC  TROPONIN I (HIGH SENSITIVITY)  TROPONIN I (HIGH SENSITIVITY)     EKG  See ED course.    RADIOLOGY Radiology interpreted by myself and radiology reports reviewed.  No fractures, intracranial hemorrhage.    PROCEDURES:  Critical Care performed: No  Procedures   MEDICATIONS ORDERED IN ED: Medications  levothyroxine (SYNTHROID) tablet 50 mcg (has no administration in time range)  sodium chloride flush (NS) 0.9 % injection 3 mL (has no administration in time range)  heparin injection 5,000 Units (has no administration in time range)  senna-docusate (Senokot-S) tablet 1 tablet (has no administration in time range)  acetaminophen (TYLENOL) tablet 650 mg (has no administration in time range)    Or  acetaminophen (TYLENOL) suppository 650 mg (has no  administration in time range)  ondansetron (ZOFRAN) tablet 4 mg (has no administration in time range)    Or  ondansetron (ZOFRAN) injection 4 mg (has no administration in time range)  acetaminophen (TYLENOL) tablet 1,000 mg (1,000 mg Oral Given 07/14/23 1706)     IMPRESSION / MDM / ASSESSMENT AND PLAN / ED COURSE  I reviewed the triage vital signs and the nursing notes.                              DDX/MDM/AP: Differential diagnosis includes, but is not limited to, intracranial fracture, skull fracture, C-spine fracture.  Consider right wrist fracture.  Consider left humerus, left hand fracture, left-sided rib fractures.  Suspect patient likely had a syncopal episode in the setting of hypoxia from her reported food aspiration/choking episode, doubt other etiology of syncope though patient does report a history of long QT syndrome-fortunately QTc overall unremarkable today.  Plan: - CT head, C-spine, max face - Labs - EKG - Cardiac monitor - X-rays right wrist, left hand, left humerus, left chest wall - Tylenol - N.p.o. - Reevaluate  Patient's presentation is most consistent with acute presentation with potential threat to life or bodily function.  The patient is on the cardiac monitor to evaluate for evidence of arrhythmia and/or significant heart rate changes.  ED course below.  Workup with no obvious traumatic injuries.  Does have persistent pain and mild swelling to right wrist, will plan for wrist splint and outpatient orthopedic referral but no obvious fracture today.  Patient does have a history of prolonged QT and reportedly had a son die of this at age 59, he is understandably nervous about her apparent syncopal episode.  I suspect syncope was likely due to asphyxiation which occurred from her choking episode though also consider possibility of concussion with mild amnesia.  Patient would feel more comfortable with hospitalization which I believe is very reasonable--discussed  with hospitalist who agrees.  Clinical Course as of 07/14/23 1919  Tue Jul 14, 2023  1556 CTH/MF/Cspine: IMPRESSION: No CT evidence of acute intracranial abnormality.  No acute fracture or traumatic malalignment of the cervical spine.  No acute maxillofacial fracture.  Left frontal scalp hematoma with surrounding soft tissue swelling. Left facial and periorbital soft tissue swelling as above.  Extensive chronic microvascular ischemic changes and mild parenchymal volume loss.  Degenerative changes as above.   [MM]  1556 XR R wrist: IMPRESSION: 1. No acute osseous abnormality. 2. Mild radiocarpal and  first [facility] joint space narrowing. 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   [MM]  1628 CBC reviewed, unremarkable BMP unremarkable beyond mildly low bicarb Troponin normal [MM]  1642 Ecg = sinus rhythm, rate 87, no gross ST elevation or depression, no significant repolarization abnormality, normal axis, normal intervals.  QTc 478. [MM]  1731 XR left hand, humerus, ribs unremarkable [MM]  1845 Hospitalist paged to discuss [MM]    Clinical Course User Index [MM] Clarine Ozell LABOR, MD     FINAL CLINICAL IMPRESSION(S) / ED DIAGNOSES   Final diagnoses:  Syncope, unspecified syncope type  Contusion of face, initial encounter  Right wrist pain     Rx / DC Orders   ED Discharge Orders     None        Note:  This document was prepared using Dragon voice recognition software and may include unintentional dictation errors.   Clarine Ozell LABOR, MD 07/14/23 1919

## 2023-07-14 NOTE — ED Notes (Signed)
 Pt just told ED tech that she ALSO has current UTI and is on abx.

## 2023-07-14 NOTE — Assessment & Plan Note (Signed)
 Trazodone 50 mg nightly resume

## 2023-07-14 NOTE — Assessment & Plan Note (Signed)
 Check serum magnesium level on admission

## 2023-07-14 NOTE — ED Notes (Addendum)
 While bringing this patient to a triage room, pt tells this tech I was eating a peanut butter and jelly sandwich and it got stuck in my throat and I could not swallow it. I could not call 911 and my neighbors are dead so they could not help me. I was going outside and next thing knew I was on the ground and my head was hurting. I drove myself here by the tips of my toes. This pt came by [facility] with a different story given in report. This tech let Katheren, RN and Vet, RN know about interaction.

## 2023-07-14 NOTE — Assessment & Plan Note (Signed)
 Insulin SSI with at bedtime coverage ordered

## 2023-07-14 NOTE — Hospital Course (Addendum)
 [name] is a 73 year old female with history of GERD, hypothyroid, non-insulin-dependent diabetes mellitus, fibromyalgia, asthma, hypertension, who presents ED for chief concerns of a fall with multiple sites of bruising and syncope.  Vitals in the ED showed T of 99.7, respiration rate 17, heart rate 88, blood pressure 136/93, SpO2 94% on room air.  Serum sodium is 136, potassium 3.5, chloride 105, bicarb 19, BUN of 16, serum creatinine 1.00, EGFR 59, nonfasting blood glucose 174, WBC 6.9, hemoglobin 14.9, platelets of 172.  HS troponin is 15.  ED treatment: Acetaminophen 1000 mg p.o. one-time dose.

## 2023-07-14 NOTE — H&P (Addendum)
 History and Physical   Shannon Flores FMW:[id number] DOB: 11/21/1950 DOA: 07/14/2023  PCP: [name], MD  Outpatient Specialists: Dr. Jannett Fairly, [facility] clinic neurology Patient coming from: Home via POV  I have personally briefly reviewed patient's old medical records in [facility] EMR.  Chief Concern: Syncope, fall  HPI: [name] is a 73 year old female with history of GERD, hypothyroid, non-insulin-dependent diabetes mellitus, fibromyalgia, asthma, hypertension, who presents ED for chief concerns of a fall with multiple sites of bruising and syncope.  Vitals in the ED showed T of 99.7, respiration rate 17, heart rate 88, blood pressure 136/93, SpO2 94% on room air.  Serum sodium is 136, potassium 3.5, chloride 105, bicarb 19, BUN of 16, serum creatinine 1.00, EGFR 59, nonfasting blood glucose 174, WBC 6.9, hemoglobin 14.9, platelets of 172.  HS troponin is 15.  ED treatment: Acetaminophen 1000 mg p.o. one-time dose. ---------------------------------- At bedside, patient is able to tell me her first and last name, age, location, current calendar year.  Patient states that she is currently being treated for UTI with an antibiotic that is a twice a day medication.  Patient is currently on her third day of antibiotic treatment.  Patient reports that she was having some diarrhea and noted that she is on antibiotic so she needs to take her antibiotic with food.  At 3 AM this morning she remembered that she did not eat yet and that since she has been on antibiotic, she should get up and eat something.  She then made herself a peanut butter sandwich.  As she is eating, she felt the food getting stuck in her throat and she could not swallow and could not breathe.  She could not find her phone.  So she intended to run outside the door to come to her neighbors house for help.  In the next thing she remembers she was on the floor and felt bruised all over.  She then noted  that there was the food bolus is laying next to her so she must have spit it up or vomited up the food.  She then called EMS.  Patient has bruising and tenderness all over. Patient is able to flex and extend into a gripping motion with her left hand at bedside.  Social history: Patient lives at home on her own.  She denies tobacco, EtOH, recreational drug use.  She is retired and formerly was a Chartered loss adjuster.  ROS: Constitutional: no weight change, no fever ENT/Mouth: no sore throat, no rhinorrhea Eyes: no eye pain, no vision changes Cardiovascular: no chest pain, no dyspnea,  no edema, no palpitations Respiratory: no cough, no sputum, no wheezing Gastrointestinal: no nausea, no vomiting, no diarrhea, no constipation Genitourinary: no urinary incontinence, no dysuria, no hematuria Musculoskeletal: no arthralgias, no myalgias Skin: no skin lesions, no pruritus, + ecchymosis Neuro: + weakness, no loss of consciousness, no syncope Psych: no anxiety, no depression, + decrease appetite Heme/Lymph: no bruising, no bleeding  ED Course: Discussed with EDP, patient requiring hospitalization for chief concerns of syncope.  Assessment/Plan  Principal Problem:   Syncope Active Problems:   History of asthma   History of anxiety   Ecchymosis of left eye   Ecchymosis of forearm   Ecchymosis of wrist   Traumatic ecchymosis of face   Traumatic ecchymosis of hand, left, initial encounter   Traumatic ecchymosis of hand, right, initial encounter   Chronic pain syndrome   Hypothyroidism   Uncontrolled type 2 diabetes mellitus  with hyperglycemia, without long-term current use of insulin (HCC)   Recurrent major depressive disorder, in partial remission (HCC)   Prolonged QT syndrome   UTI (urinary tract infection)   Assessment and Plan:  * Syncope Differentials include syncope, fall with concussion, hypoxia in setting of asphyxiation from food bolus Fall precaution Complete echo  ordered  History of anxiety Home alprazolam 0.5 mg daily as needed for anxiety resumed  History of asthma Albuterol nebulizer every 6 hours as needed for shortness of breath, wheezing 5 days ordered  Traumatic ecchymosis of hand, right, initial encounter sling in place  Traumatic ecchymosis of face Present on admission  Recurrent major depressive disorder, in partial remission (HCC) Trazodone 50 mg nightly resume  Uncontrolled type 2 diabetes mellitus with hyperglycemia, without long-term current use of insulin (HCC) Insulin SSI with at bedtime coverage ordered  Hypothyroidism Levothyroxine 25 mcg daily resumed  Chronic pain syndrome PDMP reviewed Patient currently has prescription for alprazolam 0.5 mg, 30 tablets for 30 day supply filled on 06/23/2023  UTI (urinary tract infection) Present on admission Home nitrofurantoin, 100 mg p.o. twice daily resumed on admission  Prolonged QT syndrome Check serum magnesium level on admission  Chart reviewed.   DVT prophylaxis: Heparin 5000 units subcutaneous every 8 hours Code Status: Full code Diet: Heart healthy Family Communication: Updated daughter, Damien at bedside with patient's permission Disposition Plan: Pending clinical course, pending complete echo Consults called: None at this time Admission status: Telemetry cardiac, observation  Past Medical History:  Diagnosis Date   Allergic rhinitis    Anxiety    Asthma    Carpal tunnel syndrome    Chronic pain syndrome    Depression    Diabetes mellitus without complication (HCC)    Family history of long QT syndrome    GERD (gastroesophageal reflux disease)    Hyperlipidemia    Hypertension    IBS (irritable bowel syndrome)    Migraine 09/28/2015   Migraine headache    Non-cystic lesion of iris    Thyroid disease    hypothyroidism   Past Surgical History:  Procedure Laterality Date   CARPAL TUNNEL RELEASE  04/2008   COLPOSCOPY     KNEE SURGERY Bilateral     meniscal tear     MENISCUS REPAIR     right knee    MULTIPLE TOOTH EXTRACTIONS     VAGINAL HYSTERECTOMY     Social History:  reports that she has never smoked. She has never used smokeless tobacco. She reports that she does not drink alcohol and does not use drugs.  Allergies  Allergen Reactions   Propoxyphene Other (See Comments)    can't remember   Statins Other (See Comments)    unspecified unspecified Unknown reaction    Benadryl [Diphenhydramine Hcl] Hives   Diflucan [Fluconazole, Injectable]    Fluconazole Diarrhea    unspecified   Imitrex [Sumatriptan Base] Other (See Comments)    Tachycardia   Iodine Hives    IV Dye, Iodine containing contrast media group    Iohexol    Metformin Diarrhea   Naprelan [Naproxen Sodium]     rash   Provera [Medroxyprogesterone Acetate]    Prozac [Fluoxetine Hcl]    Amoxicillin-Pot Clavulanate Rash   Ceclor [Cefaclor] Rash   Clindamycin Rash and Hives   Epinephrine Palpitations   Erythromycin Rash   Sulfa Antibiotics Rash   Tetracyclines & Related Rash   Family History  Problem Relation Age of Onset   Diabetes Father  Hypertension Other    Lung cancer Other    Heart defect Son        Passed suddenly at age 30   Other Mother        Phlebitis   Migraines Mother    Migraines Sister    Migraines Brother    Migraines Maternal Grandfather    Migraines Sister    Family history: Family history reviewed and not pertinent.  Prior to Admission medications   Medication Sig Start Date End Date Taking? Authorizing Provider  albuterol (VENTOLIN HFA) 108 (90 Base) MCG/ACT inhaler Inhale 2 puffs into the lungs every 6 (six) hours as needed. 02/14/15   [provider]  ALPRAZolam (XANAX) 0.5 MG tablet Take 0.5 mg by mouth daily as needed. 02/23/20   [provider]  aspirin 81 MG EC tablet Take 81 mg by mouth daily.    [provider]  atenolol (TENORMIN) 100 MG tablet Take 1.5 tablets by mouth daily. 12/04/14    [provider]  canagliflozin (INVOKANA) 300 MG TABS tablet Take 300 mg by mouth daily before breakfast. 05/10/21   [provider]  cyclobenzaprine (FLEXERIL) 10 MG tablet TAKE ONE TABLET BY MOUTH TWICE DAILY AS NEEDED FOR MUSCLE SPASM Patient not taking: Reported on 03/02/2023 10/29/16   Dolphus Reiter, MD  diphenhydrAMINE (BENADRYL) 25 MG tablet Take 25 mg by mouth every 6 (six) hours as needed. Patient not taking: Reported on 03/02/2023    [provider]  gabapentin  (NEURONTIN ) 100 MG capsule Take by mouth. 3 tablets two times a day. 04/15/18   [provider]  levothyroxine (SYNTHROID, LEVOTHROID) 50 MCG tablet Take 50 mcg by mouth daily.    [provider]  losartan (COZAAR) 25 MG tablet Take by mouth. 04/08/16   [provider]  montelukast (SINGULAIR) 10 MG tablet Take by mouth. 03/24/16   [provider]  nystatin cream (MYCOSTATIN) Apply 1 application. topically 2 (two) times daily. 04/21/21   [provider]  RABEprazole (ACIPHEX) 20 MG tablet Take 20 mg by mouth daily.    [provider]  traZODone (DESYREL) 50 MG tablet Take 1 tablet by mouth at bedtime. 02/07/20   [provider]  Vibegron  (GEMTESA ) 75 MG TABS Take 1 tablet (75 mg total) by mouth daily. 03/02/23   Gaston Hamilton, MD   Physical Exam: Vitals:   07/14/23 1325 07/14/23 1327 07/14/23 1904 07/14/23 1944  BP: (!) 136/93  128/80 125/85  Pulse: 88  72 71  Resp: 17  18 16   Temp: 99.7 F (37.6 C)  98 F (36.7 C) 97.6 F (36.4 C)  TempSrc:   Oral Oral  SpO2: 94%  96% 98%  Weight:  93.4 kg    Height:  5' 6 (1.676 m)     Constitutional: appears age-appropriate, NAD, calm Eyes: PERRL, lids and conjunctivae normal ENMT: Mucous membranes are moist. Posterior pharynx clear of any exudate or lesions. Age-appropriate dentition. Hearing appropriate Neck: normal, supple, no masses, no thyromegaly Respiratory: clear to auscultation  bilaterally, no wheezing, no crackles. Normal respiratory effort. No accessory muscle use.  Cardiovascular: Regular rate and rhythm, no murmurs / rubs / gallops. No extremity edema. 2+ pedal pulses. No carotid bruits.  Abdomen: Obese abdomen, no tenderness, no masses palpated, no hepatosplenomegaly. Bowel sounds positive.  Musculoskeletal: no clubbing / cyanosis. No joint deformity upper and lower extremities. Good ROM, no contractures, no atrophy. Normal muscle tone.  Skin: Forehead, left periorbital, chin, cheek ecchymosis.  Bilateral upper extremity  ecchymosis Neurologic: Sensation intact. Strength 5/5 in all 4.  Psychiatric: Normal judgment and insight. Alert and oriented x 3. Normal mood.   EKG: independently reviewed, showing sinus rhythm with rate of 87, QTc 478  X-ray on Admission: I personally reviewed and I agree with radiologist reading as below.  DG Hand Complete Left Result Date: 07/14/2023 CLINICAL DATA:  fall, L chest wall pain, shoulder pain, hand pain EXAM: LEFT HAND - COMPLETE 3+ VIEW COMPARISON:  None FINDINGS: No fracture or dislocation. Normal alignment. DJD at the first [facility] joint, and multiple interphalangeal joints most marked index and long D IP, little finger PIP. Regional soft tissues unremarkable. Carpal rows intact. IMPRESSION: 1. No acute findings. 2. Multifocal DJD. Electronically Signed   By: JONETTA Faes M.D.   On: 07/14/2023 17:21   DG Humerus Left Result Date: 07/14/2023 CLINICAL DATA:  Fall EXAM: LEFT HUMERUS - 2+ VIEW COMPARISON:  None Available. FINDINGS: There is no evidence of fracture or other focal bone lesions. Soft tissues are unremarkable. IMPRESSION: Negative. Electronically Signed   By: Greig Pique M.D.   On: 07/14/2023 17:20   DG Ribs Unilateral W/Chest Left Result Date: 07/14/2023 CLINICAL DATA:  Fall and left chest wall pain. EXAM: LEFT RIBS AND CHEST - 3+ VIEW COMPARISON:  Chest radiograph dated 03/13/2008. FINDINGS: No focal consolidation, pleural  effusion, pneumothorax. The cardiac silhouette is within limits. No acute osseous pathology. No displaced rib fractures. IMPRESSION: 1. No acute cardiopulmonary process. 2. No displaced rib fractures. Electronically Signed   By: Vanetta Chou M.D.   On: 07/14/2023 17:19   CT Maxillofacial Wo Contrast Result Date: 07/14/2023 CLINICAL DATA:  Trauma, fall, bruising to forehead and left eye. EXAM: CT HEAD WITHOUT CONTRAST CT MAXILLOFACIAL WITHOUT CONTRAST CT CERVICAL SPINE WITHOUT CONTRAST TECHNIQUE: Multidetector CT imaging of the head, cervical spine, and maxillofacial structures were performed using the standard protocol without intravenous contrast. Multiplanar CT image reconstructions of the cervical spine and maxillofacial structures were also generated. RADIATION DOSE REDUCTION: This exam was performed according to the departmental dose-optimization program which includes automated exposure control, adjustment of the mA and/or kV according to patient size and/or use of iterative reconstruction technique. COMPARISON:  CT head 01/28/2023 MRI cervical spine 06/27/2021. FINDINGS: CT HEAD FINDINGS Brain: No acute intracranial hemorrhage. No CT evidence of acute infarct. Nonspecific hypoattenuation in the periventricular and subcortical white matter favored to reflect chronic microvascular ischemic changes. Remote lacunar infarcts in the bilateral basal ganglia and in the right aspect of the pons. Mild generalized parenchymal volume loss. No edema, mass effect, or midline shift. The basilar cisterns are patent. Ventricles: The ventricles are normal. Vascular: Atherosclerotic calcifications of the carotid siphons and intracranial vertebral arteries. No hyperdense vessel. Skull: No acute or aggressive finding. Other: Mastoid air cells are clear. CT MAXILLOFACIAL FINDINGS Osseous: No fracture or mandibular dislocation. No destructive process. Orbits: Globes are intact. Lenses are normally located. Extraocular  muscles and optic nerve sheath complexes are unremarkable. There is mild swelling of the preseptal soft tissues of the left orbit particularly along the inferior aspect. Sinuses: No significant mucosal thickening.  No air-fluid levels. Soft tissues: Hematoma in the left frontal scalp with surrounding soft tissue swelling which extends inferiorly over the supraorbital ridge and glabella with soft tissue swelling noted extending along the superolateral aspect of the left orbit. CT CERVICAL SPINE FINDINGS Alignment: Normal. Skull base and vertebrae: No acute fracture. No primary bone lesion or focal pathologic process. Soft tissues and spinal canal: No prevertebral fluid  or swelling. No visible canal hematoma. Disc levels: Severe disc space narrowing at C5-6. Additional moderate disc space narrowing at C4-5 and C6-7. Disc osteophyte complexes at multiple levels. Moderate spinal canal stenosis at C4-5 and C5-6. Facet arthrosis and uncovertebral hypertrophy at multiple levels. Severe foraminal stenosis on the right at C4-5. Moderate to severe foraminal stenosis bilaterally at C5-6. Upper chest: Negative. Other: None. IMPRESSION: No CT evidence of acute intracranial abnormality. No acute fracture or traumatic malalignment of the cervical spine. No acute maxillofacial fracture. Left frontal scalp hematoma with surrounding soft tissue swelling. Left facial and periorbital soft tissue swelling as above. Extensive chronic microvascular ischemic changes and mild parenchymal volume loss. Degenerative changes as above. Electronically Signed   By: Donnice Mania M.D.   On: 07/14/2023 15:38   CT HEAD WO CONTRAST ( ) Result Date: 07/14/2023 CLINICAL DATA:  Trauma, fall, bruising to forehead and left eye. EXAM: CT HEAD WITHOUT CONTRAST CT MAXILLOFACIAL WITHOUT CONTRAST CT CERVICAL SPINE WITHOUT CONTRAST TECHNIQUE: Multidetector CT imaging of the head, cervical spine, and maxillofacial structures were performed using the standard  protocol without intravenous contrast. Multiplanar CT image reconstructions of the cervical spine and maxillofacial structures were also generated. RADIATION DOSE REDUCTION: This exam was performed according to the departmental dose-optimization program which includes automated exposure control, adjustment of the mA and/or kV according to patient size and/or use of iterative reconstruction technique. COMPARISON:  CT head 01/28/2023 MRI cervical spine 06/27/2021. FINDINGS: CT HEAD FINDINGS Brain: No acute intracranial hemorrhage. No CT evidence of acute infarct. Nonspecific hypoattenuation in the periventricular and subcortical white matter favored to reflect chronic microvascular ischemic changes. Remote lacunar infarcts in the bilateral basal ganglia and in the right aspect of the pons. Mild generalized parenchymal volume loss. No edema, mass effect, or midline shift. The basilar cisterns are patent. Ventricles: The ventricles are normal. Vascular: Atherosclerotic calcifications of the carotid siphons and intracranial vertebral arteries. No hyperdense vessel. Skull: No acute or aggressive finding. Other: Mastoid air cells are clear. CT MAXILLOFACIAL FINDINGS Osseous: No fracture or mandibular dislocation. No destructive process. Orbits: Globes are intact. Lenses are normally located. Extraocular muscles and optic nerve sheath complexes are unremarkable. There is mild swelling of the preseptal soft tissues of the left orbit particularly along the inferior aspect. Sinuses: No significant mucosal thickening.  No air-fluid levels. Soft tissues: Hematoma in the left frontal scalp with surrounding soft tissue swelling which extends inferiorly over the supraorbital ridge and glabella with soft tissue swelling noted extending along the superolateral aspect of the left orbit. CT CERVICAL SPINE FINDINGS Alignment: Normal. Skull base and vertebrae: No acute fracture. No primary bone lesion or focal pathologic process. Soft  tissues and spinal canal: No prevertebral fluid or swelling. No visible canal hematoma. Disc levels: Severe disc space narrowing at C5-6. Additional moderate disc space narrowing at C4-5 and C6-7. Disc osteophyte complexes at multiple levels. Moderate spinal canal stenosis at C4-5 and C5-6. Facet arthrosis and uncovertebral hypertrophy at multiple levels. Severe foraminal stenosis on the right at C4-5. Moderate to severe foraminal stenosis bilaterally at C5-6. Upper chest: Negative. Other: None. IMPRESSION: No CT evidence of acute intracranial abnormality. No acute fracture or traumatic malalignment of the cervical spine. No acute maxillofacial fracture. Left frontal scalp hematoma with surrounding soft tissue swelling. Left facial and periorbital soft tissue swelling as above. Extensive chronic microvascular ischemic changes and mild parenchymal volume loss. Degenerative changes as above. Electronically Signed   By: Donnice Mania M.D.   On: 07/14/2023 15:38  CT Cervical Spine Wo Contrast Result Date: 07/14/2023 CLINICAL DATA:  Trauma, fall, bruising to forehead and left eye. EXAM: CT HEAD WITHOUT CONTRAST CT MAXILLOFACIAL WITHOUT CONTRAST CT CERVICAL SPINE WITHOUT CONTRAST TECHNIQUE: Multidetector CT imaging of the head, cervical spine, and maxillofacial structures were performed using the standard protocol without intravenous contrast. Multiplanar CT image reconstructions of the cervical spine and maxillofacial structures were also generated. RADIATION DOSE REDUCTION: This exam was performed according to the departmental dose-optimization program which includes automated exposure control, adjustment of the mA and/or kV according to patient size and/or use of iterative reconstruction technique. COMPARISON:  CT head 01/28/2023 MRI cervical spine 06/27/2021. FINDINGS: CT HEAD FINDINGS Brain: No acute intracranial hemorrhage. No CT evidence of acute infarct. Nonspecific hypoattenuation in the periventricular and  subcortical white matter favored to reflect chronic microvascular ischemic changes. Remote lacunar infarcts in the bilateral basal ganglia and in the right aspect of the pons. Mild generalized parenchymal volume loss. No edema, mass effect, or midline shift. The basilar cisterns are patent. Ventricles: The ventricles are normal. Vascular: Atherosclerotic calcifications of the carotid siphons and intracranial vertebral arteries. No hyperdense vessel. Skull: No acute or aggressive finding. Other: Mastoid air cells are clear. CT MAXILLOFACIAL FINDINGS Osseous: No fracture or mandibular dislocation. No destructive process. Orbits: Globes are intact. Lenses are normally located. Extraocular muscles and optic nerve sheath complexes are unremarkable. There is mild swelling of the preseptal soft tissues of the left orbit particularly along the inferior aspect. Sinuses: No significant mucosal thickening.  No air-fluid levels. Soft tissues: Hematoma in the left frontal scalp with surrounding soft tissue swelling which extends inferiorly over the supraorbital ridge and glabella with soft tissue swelling noted extending along the superolateral aspect of the left orbit. CT CERVICAL SPINE FINDINGS Alignment: Normal. Skull base and vertebrae: No acute fracture. No primary bone lesion or focal pathologic process. Soft tissues and spinal canal: No prevertebral fluid or swelling. No visible canal hematoma. Disc levels: Severe disc space narrowing at C5-6. Additional moderate disc space narrowing at C4-5 and C6-7. Disc osteophyte complexes at multiple levels. Moderate spinal canal stenosis at C4-5 and C5-6. Facet arthrosis and uncovertebral hypertrophy at multiple levels. Severe foraminal stenosis on the right at C4-5. Moderate to severe foraminal stenosis bilaterally at C5-6. Upper chest: Negative. Other: None. IMPRESSION: No CT evidence of acute intracranial abnormality. No acute fracture or traumatic malalignment of the cervical  spine. No acute maxillofacial fracture. Left frontal scalp hematoma with surrounding soft tissue swelling. Left facial and periorbital soft tissue swelling as above. Extensive chronic microvascular ischemic changes and mild parenchymal volume loss. Degenerative changes as above. Electronically Signed   By: Donnice Mania M.D.   On: 07/14/2023 15:38   DG Wrist Complete Right Result Date: 07/14/2023 CLINICAL DATA:  Pain after fall. EXAM: RIGHT WRIST - COMPLETE 3+ VIEW COMPARISON:  None Available. FINDINGS: There is no evidence of acute fracture or dislocation. Mild radiocarpal and first CMC joint space narrowing. 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 No radiopaque foreign body. IMPRESSION: 1. No acute osseous abnormality. 2. Mild radiocarpal and first CMC joint space narrowing. 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 Electronically Signed   By: Harrietta Sherry M.D.   On: 07/14/2023 14:36   Labs on Admission: I have personally reviewed following labs  CBC: Recent Labs  Lab 07/14/23 1335  WBC 6.9  HGB 14.9  HCT 44.1  MCV 89.5  PLT 172   Basic Metabolic Panel: Recent Labs  Lab 07/14/23 1335 07/14/23  2023  NA 136  --   K 3.5  --   CL 105  --   CO2 19*  --   GLUCOSE 174*  --   BUN 16  --   CREATININE 1.00  --   CALCIUM 8.9  --   MG  --  1.9   GFR: Estimated Creatinine Clearance: 56.8 mL/min (by C-G formula based on SCr of 1 mg/dL).  Urine analysis:    Component Value Date/Time   COLORURINE YELLOW (A) 07/14/2023 2051   APPEARANCEUR HAZY (A) 07/14/2023 2051   APPEARANCEUR Cloudy (A) 03/02/2023 1527   LABSPEC 1.014 07/14/2023 2051   PHURINE 5.0 07/14/2023 2051   GLUCOSEU >=500 (A) 07/14/2023 2051   HGBUR NEGATIVE 07/14/2023 2051   BILIRUBINUR NEGATIVE 07/14/2023 2051   BILIRUBINUR Negative 03/02/2023 1527   KETONESUR NEGATIVE 07/14/2023 2051   PROTEINUR NEGATIVE 07/14/2023 2051   NITRITE NEGATIVE 07/14/2023 2051   LEUKOCYTESUR NEGATIVE 07/14/2023 2051   This  document was prepared using Dragon Voice Recognition software and may include unintentional dictation errors.  Dr. Sherre [facility]  If 7PM-7AM, please contact overnight-coverage provider If 7AM-7PM, please contact day attending provider [email or web address]  07/14/2023, 9:32 PM

## 2023-07-14 NOTE — Assessment & Plan Note (Signed)
 Present on admission

## 2023-07-14 NOTE — Assessment & Plan Note (Addendum)
 PDMP reviewed Patient currently has prescription for alprazolam 0.5 mg, 30 tablets for 30 day supply filled on 06/23/2023

## 2023-07-14 NOTE — ED Triage Notes (Signed)
 See first nurse Note.

## 2023-07-14 NOTE — Assessment & Plan Note (Signed)
 Home alprazolam 0.5 mg daily as needed for anxiety resumed

## 2023-07-14 NOTE — ED Notes (Signed)
 First nurse note: Pt to ED for fall, has L bruised eye. Bruising to L forehead and under chin. Pt  woke up with chest pain, SOB, started walking outside to get neighbors and then fell. Probable LOC. No VS at [facility].

## 2023-07-14 NOTE — Assessment & Plan Note (Signed)
 sling in place

## 2023-07-14 NOTE — Assessment & Plan Note (Signed)
-  Levothyroxine 25 mcg daily resumed

## 2023-07-14 NOTE — Assessment & Plan Note (Addendum)
 Present on admission Home nitrofurantoin, 100 mg p.o. twice daily resumed on admission

## 2023-07-14 NOTE — Assessment & Plan Note (Signed)
 Albuterol nebulizer every 6 hours as needed for shortness of breath, wheezing 5 days ordered

## 2023-07-15 ENCOUNTER — Observation Stay
Admit: 2023-07-15 | Discharge: 2023-07-15 | Disposition: A | Discharge status: Home / Self Care | Attending: Internal Medicine | Admitting: Internal Medicine

## 2023-07-15 ENCOUNTER — Encounter

## 2023-07-15 DIAGNOSIS — R55 Syncope and collapse: Secondary | ICD-10-CM | POA: Diagnosis not present

## 2023-07-15 LAB — BASIC METABOLIC PANEL WITH GFR
Anion gap: 7 (ref 5–15)
BUN: 16 mg/dL (ref 8–23)
CO2: 25 mmol/L (ref 22–32)
Calcium: 8.6 mg/dL — ABNORMAL LOW (ref 8.9–10.3)
Chloride: 106 mmol/L (ref 98–111)
Creatinine, Ser: 0.92 mg/dL (ref 0.44–1.00)
GFR, Estimated: >60 mL/min (ref >60)
Glucose, Bld: 125 mg/dL — ABNORMAL HIGH (ref 70–99)
Potassium: 2.9 mmol/L — ABNORMAL LOW (ref 3.5–5.1)
Sodium: 138 mmol/L (ref 135–145)

## 2023-07-15 LAB — CBC
HCT: 40.1 % (ref 36.0–46.0)
Hemoglobin: 13.9 g/dL (ref 12.0–15.0)
MCH: 31 pg (ref 26.0–34.0)
MCHC: 34.7 g/dL (ref 30.0–36.0)
MCV: 89.5 fL (ref 80.0–100.0)
Platelets: 143 10*3/uL — ABNORMAL LOW (ref 150–400)
RBC: 4.48 MIL/uL (ref 3.87–5.11)
RDW: 12.2 % (ref 11.5–15.5)
WBC: 6.9 10*3/uL (ref 4.0–10.5)
nRBC: 0 % (ref 0.0–0.2)

## 2023-07-15 LAB — GLUCOSE, CAPILLARY
Glucose-Capillary: 145 mg/dL — ABNORMAL HIGH (ref 70–99)
Glucose-Capillary: 157 mg/dL — ABNORMAL HIGH (ref 70–99)
Glucose-Capillary: 136 mg/dL — ABNORMAL HIGH (ref 70–99)
Glucose-Capillary: 240 mg/dL — ABNORMAL HIGH (ref 70–99)

## 2023-07-15 LAB — HEMOGLOBIN A1C
Hgb A1c MFr Bld: 6.6 % — ABNORMAL HIGH (ref 4.8–5.6)
Mean Plasma Glucose: 142.72 mg/dL

## 2023-07-15 MED ORDER — SODIUM CHLORIDE 0.9 % IV SOLN: INTRAVENOUS | Status: AC

## 2023-07-15 MED ORDER — POTASSIUM CHLORIDE CRYS ER 20 MEQ PO TBCR
40.0000 meq | EXTENDED_RELEASE_TABLET | ORAL | Status: AC
  Administered 2023-07-15 (×2): 40 meq via ORAL
  Filled 2023-07-15 (×2): qty 2

## 2023-07-15 NOTE — Care Management Obs Status (Signed)
 MEDICARE OBSERVATION STATUS NOTIFICATION   Patient Details  Name: Shannon Flores MRN: [id number] Date of Birth: 1950-04-23   Medicare Observation Status Notification Given:  Yes    [name] 07/15/2023, 1:50 PM

## 2023-07-15 NOTE — Evaluation (Signed)
 Physical Therapy Evaluation Patient Details Name: Shannon Flores MRN: [id number] DOB: 04/28/1950 Today's Date: 07/15/2023  History of Present Illness  73 year old female with history of GERD, hypothyroid, non-insulin-dependent diabetes mellitus, fibromyalgia, asthma, hypertension, who presents ED for chief concerns of a fall with multiple sites of bruising and syncope  Clinical Impression  Pt alert, agreeable to PT. Reported at baseline she lives alone, is independent. Orthostatics done and unremarkable, though pt complained of ongoing dizziness throughout mobility. CGA to EOB, extra time to come up into standing with RW, CGA. Improved steadiness with 2nd and 3rd rep (from commode) than first, though still effortful for pt. She ambulated ~65ft total to bathroom, and endorsed significant fatigue with activity. Pt/PT also discussed potential symptoms of concussion, as well as ice and elevation of R wrist (splint removed at end of session to allow pt a break).   Overall the patient demonstrated deficits (see PT Problem List) that impede the patient's functional abilities, safety, and mobility and would benefit from skilled PT intervention.          If plan is discharge home, recommend the following: A little help with walking and/or transfers;A little help with bathing/dressing/bathroom;Assist for transportation;Help with stairs or ramp for entrance;Assistance with cooking/housework   Can travel by private vehicle   Yes    Equipment Recommendations Other (comment) (TBD)  Recommendations for Other Services       Functional Status Assessment Patient has had a recent decline in their functional status and demonstrates the ability to make significant improvements in function in a reasonable and predictable amount of time.     Precautions / Restrictions Precautions Precautions: Fall Recall of Precautions/Restrictions: Intact Restrictions Weight Bearing Restrictions Per Provider Order: No       Mobility  Bed Mobility Overal bed mobility: Needs Assistance Bed Mobility: Supine to Sit, Sit to Supine     Supine to sit: Contact guard Sit to supine: Min assist        Transfers Overall transfer level: Needs assistance Equipment used: Rolling walker (2 wheels) Transfers: Sit to/from Stand Sit to Stand: Contact guard assist                Ambulation/Gait   Gait Distance (Feet):  (55ft in total, to bathroom)              Stairs            Wheelchair Mobility     Tilt Bed    Modified Rankin (Stroke Patients Only)       Balance Overall balance assessment: Needs assistance Sitting-balance support: Feet supported Sitting balance-Leahy Scale: Good     Standing balance support: Bilateral upper extremity supported Standing balance-Leahy Scale: Fair                               Pertinent Vitals/Pain Pain Assessment Pain Assessment: Faces Faces Pain Scale: Hurts even more Pain Location: R wrist Pain Descriptors / Indicators: Discomfort Pain Intervention(s): Limited activity within patient's tolerance, Monitored during session, Repositioned    Home Living Family/patient expects to be discharged to:: Private residence Living Arrangements: Alone   Type of Home: House Home Access: Stairs to enter   Secretary/administrator of Steps: 4-5 Alternate Level Stairs-Number of Steps: flight Home Layout: Two level;Bed/bath upstairs Home Equipment: Cane - single point      Prior Function Prior Level of Function : Independent/Modified Independent;Driving  Extremity/Trunk Assessment   Upper Extremity Assessment Upper Extremity Assessment: Defer to OT evaluation RUE Deficits / Details: brace donned for comfort with increased pain - no fx per xray    Lower Extremity Assessment Lower Extremity Assessment: Generalized weakness (able to move BLE against gravity)       Communication    Communication Communication: No apparent difficulties    Cognition Arousal: Alert Behavior During Therapy: [facility] for tasks assessed/performed                             Following commands: Intact       Cueing       General Comments      Exercises     Assessment/Plan    PT Assessment Patient needs continued PT services  PT Problem List Decreased strength;Decreased activity tolerance;Decreased mobility;Decreased balance       PT Treatment Interventions DME instruction;Balance training;Gait training;Neuromuscular re-education;Stair training;Functional mobility training;Patient/family education;Therapeutic activities;Therapeutic exercise    PT Goals (Current goals can be found in the Care Plan section)  Acute Rehab PT Goals Patient Stated Goal: to go home when she feels better PT Goal Formulation: With patient Time For Goal Achievement: 07/29/23 Potential to Achieve Goals: Good    Frequency Min 2X/week     Co-evaluation               AM-PAC PT 6 Clicks Mobility  Outcome Measure Help needed turning from your back to your side while in a flat bed without using bedrails?: A Little Help needed moving from lying on your back to sitting on the side of a flat bed without using bedrails?: A Little Help needed moving to and from a bed to a chair (including a wheelchair)?: A Little Help needed standing up from a chair using your arms (e.g., wheelchair or bedside chair)?: A Little Help needed to walk in hospital room?: A Little Help needed climbing 3-5 steps with a railing? : A Little 6 Click Score: 18    End of Session Equipment Utilized During Treatment: Gait belt Activity Tolerance: Patient limited by fatigue Patient left: in bed;with call bell/phone within reach;with bed alarm set Nurse Communication: Mobility status PT Visit Diagnosis: Other abnormalities of gait and mobility (R26.89);Difficulty in walking, not elsewhere classified (R26.2);Muscle  weakness (generalized) (M62.81)    Time: 8485-8462 PT Time Calculation (min) (ACUTE ONLY): 23 min   Charges:   PT Evaluation $PT Eval Low Complexity: 1 Low PT Treatments $Therapeutic Activity: 8-22 mins PT General Charges $$ ACUTE PT VISIT: 1 Visit        Doyal Shams PT, DPT 4:02 PM,07/15/23

## 2023-07-15 NOTE — Evaluation (Signed)
 Occupational Therapy Evaluation Patient Details Name: Shannon Flores MRN: [id number] DOB: 07-07-1950 Today's Date: 07/15/2023   History of Present Illness   73 year old female with history of GERD, hypothyroid, non-insulin-dependent diabetes mellitus, fibromyalgia, asthma, hypertension, who presents ED for chief concerns of a fall with multiple sites of bruising and syncope     Clinical Impressions Patient presenting with decreased Ind in self care,balance, functional mobility/transfers, endurance, and safety awareness.Patient reports living at home alone and being Ind at baseline.She uses SPC for mobility.Pt needing min guard- min A for bed mobility and min A to stand from EOB. Pt unable to advance further secondary to feeling very dizzy and seeing spots in vision upon standing. MD notified. Patient will benefit from acute OT to increase overall independence in the areas of ADLs, functional mobility, and safety awareness in order to safely discharge.  Orthostatics: Supine: 105/78(89) Seated: 115/78(89) Standing after 3 minutes: 105/84(84)     If plan is discharge home, recommend the following:   A little help with walking and/or transfers;A little help with bathing/dressing/bathroom;Assistance with cooking/housework;Assist for transportation;Help with stairs or ramp for entrance     Functional Status Assessment   Patient has had a recent decline in their functional status and demonstrates the ability to make significant improvements in function in a reasonable and predictable amount of time.     Equipment Recommendations   BSC/3in1;Other (comment) (RW)      Precautions/Restrictions   Precautions Precautions: Fall     Mobility Bed Mobility Overal bed mobility: Needs Assistance Bed Mobility: Supine to Sit, Sit to Supine     Supine to sit: Contact guard Sit to supine: Min assist        Transfers Overall transfer level: Needs assistance Equipment used: 1  person hand held assist, Rolling walker (2 wheels) Transfers: Sit to/from Stand Sit to Stand: Min assist                  Balance Overall balance assessment: Needs assistance Sitting-balance support: Feet supported Sitting balance-Leahy Scale: Good     Standing balance support: Bilateral upper extremity supported Standing balance-Leahy Scale: Fair                             ADL either performed or assessed with clinical judgement   ADL Overall ADL's : Needs assistance/impaired                                       General ADL Comments: Pt limited secondary to orthostatic hypotension     Vision Patient Visual Report: No change from baseline              Pertinent Vitals/Pain Pain Assessment Pain Assessment: Faces Faces Pain Scale: Hurts even more Pain Location: R wrist Pain Descriptors / Indicators: Discomfort Pain Intervention(s): Repositioned, Monitored during session     Extremity/Trunk Assessment Upper Extremity Assessment Upper Extremity Assessment: Generalized weakness;Right hand dominant;RUE deficits/detail RUE Deficits / Details: brace donned for comfort with increased pain - no fx per xray   Lower Extremity Assessment Lower Extremity Assessment: Generalized weakness       Communication Communication Communication: No apparent difficulties   Cognition Arousal: Alert Behavior During Therapy: WFL for tasks assessed/performed Cognition: No apparent impairments  Following commands: Intact                  Home Living Family/patient expects to be discharged to:: Private residence Living Arrangements: Alone   Type of Home: House Home Access: Stairs to enter [employer] of Steps: 4-5   Home Layout: Two level;Bed/bath upstairs Alternate Level Stairs-Number of Steps: flight   Bathroom Shower/Tub: Tub/shower unit         Home Equipment: Cane - single  point          Prior Functioning/Environment Prior Level of Function : Independent/Modified Independent;Driving                    OT Problem List: Decreased strength   OT Treatment/Interventions: Self-care/ADL training;Therapeutic exercise;Energy conservation;Therapeutic activities;DME and/or AE instruction;Manual therapy;Visual/perceptual remediation/compensation;Patient/family education;Balance training      OT Goals(Current goals can be found in the care plan section)   Acute Rehab OT Goals Patient Stated Goal: to feel better OT Goal Formulation: With patient Time For Goal Achievement: 07/29/23 Potential to Achieve Goals: Fair ADL Goals Pt Will Perform Grooming: with modified independence;standing Pt Will Perform Lower Body Dressing: with modified independence;sit to/from stand Pt Will Transfer to Toilet: with modified independence;ambulating Pt Will Perform Toileting - Clothing Manipulation and hygiene: with modified independence;sit to/from stand   OT Frequency:  Min 2X/week       AM-PAC OT 6 Clicks Daily Activity     Outcome Measure Help from another person eating meals?: None Help from another person taking care of personal grooming?: None Help from another person toileting, which includes using toliet, bedpan, or urinal?: A Little Help from another person bathing (including washing, rinsing, drying)?: A Little Help from another person to put on and taking off regular upper body clothing?: A Little Help from another person to put on and taking off regular lower body clothing?: A Little 6 Click Score: 20   End of Session Equipment Utilized During Treatment: Rolling walker (2 wheels) Nurse Communication: Other (comment) (orthostatics)  Activity Tolerance: Patient tolerated treatment well Patient left: in bed;with call bell/phone within reach;with bed alarm set  OT Visit Diagnosis: Unsteadiness on feet (R26.81);Repeated falls (R29.6);Muscle weakness  (generalized) (M62.81)                Time: 8862-8796 OT Time Calculation (min): 26 min Charges:  OT General Charges $OT Visit: 1 Visit OT Evaluation $OT Eval Moderate Complexity: 1 Mod OT Treatments $Therapeutic Activity: 8-22 mins  Shannon Claude, MS, OTR/L , CBIS ascom [phone]  07/15/23, 12:47 PM

## 2023-07-15 NOTE — Progress Notes (Signed)
 PROGRESS NOTE    Shannon Flores  FMW:[id number] DOB: Jun 12, 1950 DOA: 07/14/2023 PCP: [name], MD    Brief Narrative:  73 year old female with history of GERD, hypothyroid, non-insulin-dependent diabetes mellitus, fibromyalgia, asthma, hypertension, who presents ED for chief concerns of a fall with multiple sites of bruising and syncope.   Patient reports that she was having some diarrhea and noted that she is on antibiotic so she needs to take her antibiotic with food.  At 3 AM this morning she remembered that she did not eat yet and that since she has been on antibiotic, she should get up and eat something.  She then made herself a peanut butter sandwich.  As she is eating, she felt the food getting stuck in her throat and she could not swallow and could not breathe.  She could not find her phone.  So she intended to run outside the door to come to her neighbors house for help.  In the next thing she remembers she was on the floor and felt bruised all over.  She then noted that there was the food bolus is laying next to her so she must have spit it up or vomited up the food.  She then called EMS.   Patient has bruising and tenderness all over. Patient is able to flex and extend into a gripping motion with her left hand at bedside.  Assessment & Plan:   Principal Problem:   Syncope Active Problems:   History of asthma   History of anxiety   Ecchymosis of left eye   Ecchymosis of forearm   Ecchymosis of wrist   Traumatic ecchymosis of face   Traumatic ecchymosis of hand, left, initial encounter   Traumatic ecchymosis of hand, right, initial encounter   Chronic pain syndrome   Hypothyroidism   Uncontrolled type 2 diabetes mellitus with hyperglycemia, without long-term current use of insulin (HCC)   Recurrent major depressive disorder, in partial remission (HCC)   Prolonged QT syndrome   UTI (urinary tract infection)  Syncope Broad differential at this point.  Possibly  hypoxia in the setting of asphyxiation from fluid bolus.  Patient is on room air and tolerating p.o. however remains significantly ataxic with some element of volume depletion. Plan: Follow-up 2D echocardiogram Fall precautions Regular orthostatics IV fluids Therapy evaluations   History of anxiety Home alprazolam 0.5 mg daily as needed for anxiety resumed   History of asthma Albuterol nebulizer every 6 hours as needed for shortness of breath, wheezing 5 days ordered   Traumatic ecchymosis of hand, right, initial encounter sling in place   Traumatic ecchymosis of face Present on admission   Recurrent major depressive disorder, in partial remission (HCC) Trazodone 50 mg nightly resume   Uncontrolled type 2 diabetes mellitus with hyperglycemia, without long-term current use of insulin (HCC) Insulin SSI with at bedtime coverage ordered   Hypothyroidism Levothyroxine 25 mcg daily resumed   Chronic pain syndrome PDMP reviewed Patient currently has prescription for alprazolam 0.5 mg, 30 tablets for 30 day supply filled on 06/23/2023   UTI (urinary tract infection) Present on admission Home nitrofurantoin, 100 mg p.o. twice daily resumed on admission   Prolonged QT syndrome Check serum magnesium level on admission    DVT prophylaxis: SQ heparin Code Status: Full Family Communication: None Disposition Plan: Status is: Observation The patient will require care spanning > 2 midnights and should be moved to inpatient because: Unsafe discharge plan   Level of care: Telemetry Cardiac  Consultants:  None  Procedures:  None  Antimicrobials: None   Subjective: Seen and examined.  Resting in bed.  Significant ecchymoses and feels sore  Objective: Vitals:   07/15/23 0441 07/15/23 0500 07/15/23 0757 07/15/23 1211  BP: 109/61  102/60 118/76  Pulse: 79  81 64  Resp: 18  18 16   Temp: 98 F (36.7 C)  98 F (36.7 C) 98.7 F (37.1 C)  TempSrc:   Oral Oral  SpO2: 94%   95% 96%  Weight:  84.8 kg    Height:        Intake/Output Summary (Last 24 hours) at 07/15/2023 1635 Last data filed at 07/15/2023 1442 Gross per 24 hour  Intake 603 ml  Output --  Net 603 ml   Filed Weights   07/14/23 1327 07/14/23 2145 07/15/23 0500  Weight: 93.4 kg 84.8 kg 84.8 kg    Examination:  General exam: Appears calm and comfortable  Respiratory system: Clear to auscultation. Respiratory effort normal. Cardiovascular system: S1-S2, RRR, no murmurs, no pedal edema Gastrointestinal system: Soft, NT/ND, normal bowel sounds Central nervous system: Alert and oriented. No focal neurological deficits. Extremities: Symmetric 5 x 5 power. Skin: Scattered ecchymoses on face and torso Psychiatry: Judgement and insight appear normal. Mood & affect appropriate.     Data Reviewed: I have personally reviewed following labs and imaging studies  CBC: Recent Labs  Lab 07/14/23 1335 07/15/23 0422  WBC 6.9 6.9  HGB 14.9 13.9  HCT 44.1 40.1  MCV 89.5 89.5  PLT 172 143*   Basic Metabolic Panel: Recent Labs  Lab 07/14/23 1335 07/14/23 2023 07/15/23 0422  NA 136  --  138  K 3.5  --  2.9*  CL 105  --  106  CO2 19*  --  25  GLUCOSE 174*  --  125*  BUN 16  --  16  CREATININE 1.00  --  0.92  CALCIUM 8.9  --  8.6*  MG  --  1.9  --    GFR: Estimated Creatinine Clearance: 58.9 mL/min (by C-G formula based on SCr of 0.92 mg/dL). Liver Function Tests: No results for input(s): AST, ALT, ALKPHOS, BILITOT, PROT, ALBUMIN in the last 168 hours. No results for input(s): LIPASE, AMYLASE in the last 168 hours. No results for input(s): AMMONIA in the last 168 hours. Coagulation Profile: No results for input(s): INR, PROTIME in the last 168 hours. Cardiac Enzymes: No results for input(s): CKTOTAL, CKMB, CKMBINDEX, TROPONINI in the last 168 hours. BNP (last 3 results) No results for input(s): PROBNP in the last 8760 hours. HbA1C: Recent Labs     07/15/23 0422  HGBA1C 6.6*   CBG: Recent Labs  Lab 07/14/23 2144 07/15/23 0756 07/15/23 1209  GLUCAP 120* 145* 157*   Lipid Profile: No results for input(s): CHOL, HDL, LDLCALC, TRIG, CHOLHDL, LDLDIRECT in the last 72 hours. Thyroid Function Tests: No results for input(s): TSH, T4TOTAL, FREET4, T3FREE, THYROIDAB in the last 72 hours. Anemia Panel: No results for input(s): VITAMINB12, FOLATE, FERRITIN, TIBC, IRON, RETICCTPCT in the last 72 hours. Sepsis Labs: No results for input(s): PROCALCITON, LATICACIDVEN in the last 168 hours.  No results found for this or any previous visit (from the past 240 hours).       Radiology Studies: DG Hand Complete Left Result Date: 07/14/2023 CLINICAL DATA:  fall, L chest wall pain, shoulder pain, hand pain EXAM: LEFT HAND - COMPLETE 3+ VIEW COMPARISON:  None FINDINGS: No fracture or dislocation. Normal alignment. DJD at the  first [facility] joint, and multiple interphalangeal joints most marked index and long D IP, little finger PIP. Regional soft tissues unremarkable. Carpal rows intact. IMPRESSION: 1. No acute findings. 2. Multifocal DJD. Electronically Signed   By: JONETTA Faes M.D.   On: 07/14/2023 17:21   DG Humerus Left Result Date: 07/14/2023 CLINICAL DATA:  Fall EXAM: LEFT HUMERUS - 2+ VIEW COMPARISON:  None Available. FINDINGS: There is no evidence of fracture or other focal bone lesions. Soft tissues are unremarkable. IMPRESSION: Negative. Electronically Signed   By: Greig Pique M.D.   On: 07/14/2023 17:20   DG Ribs Unilateral W/Chest Left Result Date: 07/14/2023 CLINICAL DATA:  Fall and left chest wall pain. EXAM: LEFT RIBS AND CHEST - 3+ VIEW COMPARISON:  Chest radiograph dated 03/13/2008. FINDINGS: No focal consolidation, pleural effusion, pneumothorax. The cardiac silhouette is within limits. No acute osseous pathology. No displaced rib fractures. IMPRESSION: 1. No acute cardiopulmonary process. 2. No  displaced rib fractures. Electronically Signed   By: Vanetta Chou M.D.   On: 07/14/2023 17:19   CT Maxillofacial Wo Contrast Result Date: 07/14/2023 CLINICAL DATA:  Trauma, fall, bruising to forehead and left eye. EXAM: CT HEAD WITHOUT CONTRAST CT MAXILLOFACIAL WITHOUT CONTRAST CT CERVICAL SPINE WITHOUT CONTRAST TECHNIQUE: Multidetector CT imaging of the head, cervical spine, and maxillofacial structures were performed using the standard protocol without intravenous contrast. Multiplanar CT image reconstructions of the cervical spine and maxillofacial structures were also generated. RADIATION DOSE REDUCTION: This exam was performed according to the departmental dose-optimization program which includes automated exposure control, adjustment of the mA and/or kV according to patient size and/or use of iterative reconstruction technique. COMPARISON:  CT head 01/28/2023 MRI cervical spine 06/27/2021. FINDINGS: CT HEAD FINDINGS Brain: No acute intracranial hemorrhage. No CT evidence of acute infarct. Nonspecific hypoattenuation in the periventricular and subcortical white matter favored to reflect chronic microvascular ischemic changes. Remote lacunar infarcts in the bilateral basal ganglia and in the right aspect of the pons. Mild generalized parenchymal volume loss. No edema, mass effect, or midline shift. The basilar cisterns are patent. Ventricles: The ventricles are normal. Vascular: Atherosclerotic calcifications of the carotid siphons and intracranial vertebral arteries. No hyperdense vessel. Skull: No acute or aggressive finding. Other: Mastoid air cells are clear. CT MAXILLOFACIAL FINDINGS Osseous: No fracture or mandibular dislocation. No destructive process. Orbits: Globes are intact. Lenses are normally located. Extraocular muscles and optic nerve sheath complexes are unremarkable. There is mild swelling of the preseptal soft tissues of the left orbit particularly along the inferior aspect. Sinuses: No  significant mucosal thickening.  No air-fluid levels. Soft tissues: Hematoma in the left frontal scalp with surrounding soft tissue swelling which extends inferiorly over the supraorbital ridge and glabella with soft tissue swelling noted extending along the superolateral aspect of the left orbit. CT CERVICAL SPINE FINDINGS Alignment: Normal. Skull base and vertebrae: No acute fracture. No primary bone lesion or focal pathologic process. Soft tissues and spinal canal: No prevertebral fluid or swelling. No visible canal hematoma. Disc levels: Severe disc space narrowing at C5-6. Additional moderate disc space narrowing at C4-5 and C6-7. Disc osteophyte complexes at multiple levels. Moderate spinal canal stenosis at C4-5 and C5-6. Facet arthrosis and uncovertebral hypertrophy at multiple levels. Severe foraminal stenosis on the right at C4-5. Moderate to severe foraminal stenosis bilaterally at C5-6. Upper chest: Negative. Other: None. IMPRESSION: No CT evidence of acute intracranial abnormality. No acute fracture or traumatic malalignment of the cervical spine. No acute maxillofacial fracture. Left frontal scalp  hematoma with surrounding soft tissue swelling. Left facial and periorbital soft tissue swelling as above. Extensive chronic microvascular ischemic changes and mild parenchymal volume loss. Degenerative changes as above. Electronically Signed   By: Donnice Mania M.D.   On: 07/14/2023 15:38   CT HEAD WO CONTRAST ( ) Result Date: 07/14/2023 CLINICAL DATA:  Trauma, fall, bruising to forehead and left eye. EXAM: CT HEAD WITHOUT CONTRAST CT MAXILLOFACIAL WITHOUT CONTRAST CT CERVICAL SPINE WITHOUT CONTRAST TECHNIQUE: Multidetector CT imaging of the head, cervical spine, and maxillofacial structures were performed using the standard protocol without intravenous contrast. Multiplanar CT image reconstructions of the cervical spine and maxillofacial structures were also generated. RADIATION DOSE REDUCTION: This  exam was performed according to the departmental dose-optimization program which includes automated exposure control, adjustment of the mA and/or kV according to patient size and/or use of iterative reconstruction technique. COMPARISON:  CT head 01/28/2023 MRI cervical spine 06/27/2021. FINDINGS: CT HEAD FINDINGS Brain: No acute intracranial hemorrhage. No CT evidence of acute infarct. Nonspecific hypoattenuation in the periventricular and subcortical white matter favored to reflect chronic microvascular ischemic changes. Remote lacunar infarcts in the bilateral basal ganglia and in the right aspect of the pons. Mild generalized parenchymal volume loss. No edema, mass effect, or midline shift. The basilar cisterns are patent. Ventricles: The ventricles are normal. Vascular: Atherosclerotic calcifications of the carotid siphons and intracranial vertebral arteries. No hyperdense vessel. Skull: No acute or aggressive finding. Other: Mastoid air cells are clear. CT MAXILLOFACIAL FINDINGS Osseous: No fracture or mandibular dislocation. No destructive process. Orbits: Globes are intact. Lenses are normally located. Extraocular muscles and optic nerve sheath complexes are unremarkable. There is mild swelling of the preseptal soft tissues of the left orbit particularly along the inferior aspect. Sinuses: No significant mucosal thickening.  No air-fluid levels. Soft tissues: Hematoma in the left frontal scalp with surrounding soft tissue swelling which extends inferiorly over the supraorbital ridge and glabella with soft tissue swelling noted extending along the superolateral aspect of the left orbit. CT CERVICAL SPINE FINDINGS Alignment: Normal. Skull base and vertebrae: No acute fracture. No primary bone lesion or focal pathologic process. Soft tissues and spinal canal: No prevertebral fluid or swelling. No visible canal hematoma. Disc levels: Severe disc space narrowing at C5-6. Additional moderate disc space narrowing at  C4-5 and C6-7. Disc osteophyte complexes at multiple levels. Moderate spinal canal stenosis at C4-5 and C5-6. Facet arthrosis and uncovertebral hypertrophy at multiple levels. Severe foraminal stenosis on the right at C4-5. Moderate to severe foraminal stenosis bilaterally at C5-6. Upper chest: Negative. Other: None. IMPRESSION: No CT evidence of acute intracranial abnormality. No acute fracture or traumatic malalignment of the cervical spine. No acute maxillofacial fracture. Left frontal scalp hematoma with surrounding soft tissue swelling. Left facial and periorbital soft tissue swelling as above. Extensive chronic microvascular ischemic changes and mild parenchymal volume loss. Degenerative changes as above. Electronically Signed   By: Donnice Mania M.D.   On: 07/14/2023 15:38   CT Cervical Spine Wo Contrast Result Date: 07/14/2023 CLINICAL DATA:  Trauma, fall, bruising to forehead and left eye. EXAM: CT HEAD WITHOUT CONTRAST CT MAXILLOFACIAL WITHOUT CONTRAST CT CERVICAL SPINE WITHOUT CONTRAST TECHNIQUE: Multidetector CT imaging of the head, cervical spine, and maxillofacial structures were performed using the standard protocol without intravenous contrast. Multiplanar CT image reconstructions of the cervical spine and maxillofacial structures were also generated. RADIATION DOSE REDUCTION: This exam was performed according to the departmental dose-optimization program which includes automated exposure control, adjustment of the mA  and/or kV according to patient size and/or use of iterative reconstruction technique. COMPARISON:  CT head 01/28/2023 MRI cervical spine 06/27/2021. FINDINGS: CT HEAD FINDINGS Brain: No acute intracranial hemorrhage. No CT evidence of acute infarct. Nonspecific hypoattenuation in the periventricular and subcortical white matter favored to reflect chronic microvascular ischemic changes. Remote lacunar infarcts in the bilateral basal ganglia and in the right aspect of the pons. Mild  generalized parenchymal volume loss. No edema, mass effect, or midline shift. The basilar cisterns are patent. Ventricles: The ventricles are normal. Vascular: Atherosclerotic calcifications of the carotid siphons and intracranial vertebral arteries. No hyperdense vessel. Skull: No acute or aggressive finding. Other: Mastoid air cells are clear. CT MAXILLOFACIAL FINDINGS Osseous: No fracture or mandibular dislocation. No destructive process. Orbits: Globes are intact. Lenses are normally located. Extraocular muscles and optic nerve sheath complexes are unremarkable. There is mild swelling of the preseptal soft tissues of the left orbit particularly along the inferior aspect. Sinuses: No significant mucosal thickening.  No air-fluid levels. Soft tissues: Hematoma in the left frontal scalp with surrounding soft tissue swelling which extends inferiorly over the supraorbital ridge and glabella with soft tissue swelling noted extending along the superolateral aspect of the left orbit. CT CERVICAL SPINE FINDINGS Alignment: Normal. Skull base and vertebrae: No acute fracture. No primary bone lesion or focal pathologic process. Soft tissues and spinal canal: No prevertebral fluid or swelling. No visible canal hematoma. Disc levels: Severe disc space narrowing at C5-6. Additional moderate disc space narrowing at C4-5 and C6-7. Disc osteophyte complexes at multiple levels. Moderate spinal canal stenosis at C4-5 and C5-6. Facet arthrosis and uncovertebral hypertrophy at multiple levels. Severe foraminal stenosis on the right at C4-5. Moderate to severe foraminal stenosis bilaterally at C5-6. Upper chest: Negative. Other: None. IMPRESSION: No CT evidence of acute intracranial abnormality. No acute fracture or traumatic malalignment of the cervical spine. No acute maxillofacial fracture. Left frontal scalp hematoma with surrounding soft tissue swelling. Left facial and periorbital soft tissue swelling as above. Extensive chronic  microvascular ischemic changes and mild parenchymal volume loss. Degenerative changes as above. Electronically Signed   By: Donnice Mania M.D.   On: 07/14/2023 15:38   DG Wrist Complete Right Result Date: 07/14/2023 CLINICAL DATA:  Pain after fall. EXAM: RIGHT WRIST - COMPLETE 3+ VIEW COMPARISON:  None Available. FINDINGS: There is no evidence of acute fracture or dislocation. Mild radiocarpal and first CMC joint space narrowing. 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 No radiopaque foreign body. IMPRESSION: 1. No acute osseous abnormality. 2. Mild radiocarpal and first CMC joint space narrowing. 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 Electronically Signed   By: Harrietta Sherry M.D.   On: 07/14/2023 14:36        Scheduled Meds:  aspirin EC  81 mg Oral Daily   atenolol  100 mg Oral Daily   heparin  5,000 Units Subcutaneous Q8H   insulin aspart  0-15 Units Subcutaneous TID WC   insulin aspart  0-5 Units Subcutaneous QHS   levothyroxine  25 mcg Oral Q0600   losartan  25 mg Oral Daily   nitrofurantoin (macrocrystal-monohydrate)  100 mg Oral Q12H   pantoprazole  40 mg Oral Daily   sodium chloride flush  3 mL Intravenous Q12H   traZODone  50 mg Oral QHS   Continuous Infusions:  sodium chloride 100 mL/hr at 07/15/23 1218     LOS: 0 days     Calvin KATHEE Robson, MD [facility]   If 7PM-7AM,  please contact night-coverage  07/15/2023, 4:35 PM

## 2023-07-15 NOTE — Plan of Care (Signed)
  Problem: Education: Goal: Ability to describe self-care measures that may prevent or decrease complications (Diabetes Survival Skills Education) will improve Outcome: Progressing Goal: Individualized Educational Video(s) Outcome: Progressing   Problem: Coping: Goal: Ability to adjust to condition or change in health will improve Outcome: Progressing   Problem: Fluid Volume: Goal: Ability to maintain a balanced intake and output will improve Outcome: Progressing   Problem: Health Behavior/Discharge Planning: Goal: Ability to identify and utilize available resources and services will improve Outcome: Progressing Goal: Ability to manage health-related needs will improve Outcome: Progressing   Problem: Metabolic: Goal: Ability to maintain appropriate glucose levels will improve Outcome: Progressing   Problem: Nutritional: Goal: Maintenance of adequate nutrition will improve Outcome: Progressing Goal: Progress toward achieving an optimal weight will improve Outcome: Progressing   Problem: Skin Integrity: Goal: Risk for impaired skin integrity will decrease Outcome: Progressing   Problem: Tissue Perfusion: Goal: Adequacy of tissue perfusion will improve Outcome: Progressing   Problem: Education: Goal: Knowledge of condition and prescribed therapy will improve Outcome: Progressing   Problem: Cardiac: Goal: Will achieve and/or maintain adequate cardiac output Outcome: Progressing   Problem: Physical Regulation: Goal: Complications related to the disease process, condition or treatment will be avoided or minimized Outcome: Progressing   Problem: Education: Goal: Knowledge of General Education information will improve Description: Including pain rating scale, medication(s)/side effects and non-pharmacologic comfort measures Outcome: Progressing   Problem: Health Behavior/Discharge Planning: Goal: Ability to manage health-related needs will improve Outcome:  Progressing   Problem: Clinical Measurements: Goal: Ability to maintain clinical measurements within normal limits will improve Outcome: Progressing Goal: Will remain free from infection Outcome: Progressing Goal: Diagnostic test results will improve Outcome: Progressing Goal: Respiratory complications will improve Outcome: Progressing Goal: Cardiovascular complication will be avoided Outcome: Progressing   Problem: Activity: Goal: Risk for activity intolerance will decrease Outcome: Progressing   Problem: Nutrition: Goal: Adequate nutrition will be maintained Outcome: Progressing   Problem: Coping: Goal: Level of anxiety will decrease Outcome: Progressing   Problem: Elimination: Goal: Will not experience complications related to bowel motility Outcome: Progressing Goal: Will not experience complications related to urinary retention Outcome: Progressing   Problem: Pain Managment: Goal: General experience of comfort will improve and/or be controlled Outcome: Progressing   Problem: Safety: Goal: Ability to remain free from injury will improve Outcome: Progressing   Problem: Skin Integrity: Goal: Risk for impaired skin integrity will decrease Outcome: Progressing

## 2023-07-16 DIAGNOSIS — R55 Syncope and collapse: Secondary | ICD-10-CM | POA: Diagnosis not present

## 2023-07-16 LAB — GLUCOSE, CAPILLARY
Glucose-Capillary: 157 mg/dL — ABNORMAL HIGH (ref 70–99)
Glucose-Capillary: 119 mg/dL — ABNORMAL HIGH (ref 70–99)
Glucose-Capillary: 146 mg/dL — ABNORMAL HIGH (ref 70–99)
Glucose-Capillary: 160 mg/dL — ABNORMAL HIGH (ref 70–99)
Glucose-Capillary: 183 mg/dL — ABNORMAL HIGH (ref 70–99)

## 2023-07-16 LAB — ECHOCARDIOGRAM COMPLETE
Area-P 1/2: 3.35 cm2
Height: 66 in
S' Lateral: 3.3 cm
Weight: 2991.2 [oz_av]

## 2023-07-16 LAB — BASIC METABOLIC PANEL WITH GFR
Anion gap: 7 (ref 5–15)
BUN: 19 mg/dL (ref 8–23)
CO2: 24 mmol/L (ref 22–32)
Calcium: 8.6 mg/dL — ABNORMAL LOW (ref 8.9–10.3)
Chloride: 108 mmol/L (ref 98–111)
Creatinine, Ser: 0.91 mg/dL (ref 0.44–1.00)
GFR, Estimated: >60 mL/min (ref >60)
Glucose, Bld: 116 mg/dL — ABNORMAL HIGH (ref 70–99)
Potassium: 3.3 mmol/L — ABNORMAL LOW (ref 3.5–5.1)
Sodium: 139 mmol/L (ref 135–145)

## 2023-07-16 LAB — CBC WITH DIFFERENTIAL/PLATELET
Abs Immature Granulocytes: 0.01 10*3/uL (ref 0.00–0.07)
Basophils Absolute: 0.1 10*3/uL (ref 0.0–0.1)
Basophils Relative: 1 %
Eosinophils Absolute: 0.3 10*3/uL (ref 0.0–0.5)
Eosinophils Relative: 4 %
HCT: 40.3 % (ref 36.0–46.0)
Hemoglobin: 13.8 g/dL (ref 12.0–15.0)
Immature Granulocytes: 0 %
Lymphocytes Relative: 37 %
Lymphs Abs: 2.8 10*3/uL (ref 0.7–4.0)
MCH: 30.4 pg (ref 26.0–34.0)
MCHC: 34.2 g/dL (ref 30.0–36.0)
MCV: 88.8 fL (ref 80.0–100.0)
Monocytes Absolute: 0.7 10*3/uL (ref 0.1–1.0)
Monocytes Relative: 10 %
Neutro Abs: 3.7 10*3/uL (ref 1.7–7.7)
Neutrophils Relative %: 48 %
Platelets: 145 10*3/uL — ABNORMAL LOW (ref 150–400)
RBC: 4.54 MIL/uL (ref 3.87–5.11)
RDW: 12.4 % (ref 11.5–15.5)
WBC: 7.5 10*3/uL (ref 4.0–10.5)
nRBC: 0 % (ref 0.0–0.2)

## 2023-07-16 LAB — MAGNESIUM: Magnesium: 1.7 mg/dL (ref 1.7–2.4)

## 2023-07-16 NOTE — [place] FL2 (Signed)
 [place]  MEDICAID [id number] LEVEL OF CARE FORM     IDENTIFICATION  Patient Name: Shannon Flores Birthdate: 04-Dec-1950 Sex: female Admission Date (Current Location): 07/14/2023  [facility] and [place] Number:  Chiropodist and Address:  [facility], [address], [place], [place] [address]      Provider Number: [id number]  Attending Physician Name and Address:  Jhonny Calvin NOVAK, MD  Relative Name and Phone Number:  Patrisha Perkins (Daughter)  [phone] (Home Phone    Current Level of Care: Hospital Recommended Level of Care: Skilled Nursing Facility Prior Approval Number:    Date Approved/Denied:   PASRR Number:    Discharge Plan: Home    Current Diagnoses: Patient Active Problem List   Diagnosis Date Noted   Syncope 07/14/2023   Ecchymosis of left eye 07/14/2023   Ecchymosis of forearm 07/14/2023   Ecchymosis of wrist 07/14/2023   Traumatic ecchymosis of face 07/14/2023   Traumatic ecchymosis of hand, left, initial encounter 07/14/2023   Traumatic ecchymosis of hand, right, initial encounter 07/14/2023   UTI (urinary tract infection) 07/14/2023   History of ECG 03/30/2018   Hypertension 03/30/2018   Hypothyroidism 03/30/2018   Impaired functional mobility, balance, gait, and endurance 03/30/2018   Osteopenia 03/30/2018   Altered awareness, transient 02/24/2018   Frequent falls 02/24/2018   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   Hoarding behavior 01/01/2018   History of seronegative inflammatory arthritis 04/23/2016   Fibromyalgia syndrome 04/23/2016   DDD (degenerative disc disease), cervical 04/23/2016   Carpal tunnel syndrome on left 04/23/2016   Primary osteoarthritis of both hands 04/23/2016   History of hypertension 04/23/2016   History of hypothyroidism 04/23/2016   History of gastroesophageal reflux (GERD) 04/23/2016   History of anxiety 04/23/2016   History of allergic rhinitis 04/23/2016   History of diabetes  mellitus 04/23/2016   Fibromyalgia 04/11/2016   Vitamin D deficiency 04/11/2016   Primary osteoarthritis of both knees 04/11/2016   History of asthma 04/11/2016   History of migraine 04/11/2016   Migraine 09/28/2015   Asthma without status asthmaticus 08/29/2015   Chronic pain syndrome 08/29/2015   Uncontrolled type 2 diabetes mellitus with hyperglycemia, without long-term current use of insulin (HCC) 07/10/2015   Recurrent major depressive disorder, in partial remission (HCC) 07/10/2015   DDD (degenerative disc disease), lumbar 02/14/2015   Ataxia 03/20/2014   Urge incontinence of urine 03/20/2014   Abnormal electrocardiogram-QT prolongation 06/24/2010   Sinus tachycardia 06/24/2010    Orientation RESPIRATION BLADDER Height & Weight     Self, Time, Situation, Place    Continent Weight: 87.9 kg Height:  5' 6 (167.6 cm)  BEHAVIORAL SYMPTOMS/MOOD NEUROLOGICAL BOWEL NUTRITION STATUS      Continent Diet (Heart)  AMBULATORY STATUS COMMUNICATION OF NEEDS Skin   Limited Assist Verbally Skin abrasions, Bruising                       Personal Care Assistance Level of Assistance  Bathing, Feeding, Dressing Bathing Assistance: Limited assistance Feeding assistance: Limited assistance Dressing Assistance: Limited assistance     Functional Limitations Info  Sight Sight Info: Adequate        SPECIAL CARE FACTORS FREQUENCY  PT (By licensed PT), OT (By licensed OT)     PT Frequency: Min 2x weekly OT Frequency: Min 2x weekly            Contractures Contractures Info: Not present    Additional Factors Info  Code Status, Allergies Code Status Info:  FULL Allergies Info: Propoxyphene, Statins, Benadryl (Diphenhydramine Hcl), Diflucan (Fluconazole, Injectable), Fluconazole, Imitrex (Sumatriptan Base), Iodine, Iohexol, Metformin, Naprelan (Naproxen Sodium), Provera (Medroxyprogesterone Acetate), Prozac (Fluoxetine Hcl), Amoxicillin-pot Clavulanate, Ceclor (Cefaclor),  Clindamycin, Epinephrine, Erythromycin, Sulfa Antibiotics, Tetracyclines & Related           Current Medications (07/16/2023):  This is the current hospital active medication list Current Facility-Administered Medications  Medication Dose Route Frequency Provider Last Rate Last Admin   acetaminophen (TYLENOL) tablet 650 mg  650 mg Oral Q6H PRN Cox, Amy N, DO   650 mg at 07/16/23 1343   Or   acetaminophen (TYLENOL) suppository 650 mg  650 mg Rectal Q6H PRN Cox, Amy N, DO       ALPRAZolam (XANAX) tablet 0.5 mg  0.5 mg Oral Daily PRN Cox, Amy N, DO   0.5 mg at 07/14/23 2218   aspirin EC tablet 81 mg  81 mg Oral Daily Cox, Amy N, DO   81 mg at 07/16/23 1007   atenolol (TENORMIN) tablet 100 mg  100 mg Oral Daily Cox, Amy N, DO   100 mg at 07/16/23 1008   heparin injection 5,000 Units  5,000 Units Subcutaneous Q8H Cox, Amy N, DO   5,000 Units at 07/16/23 1426   insulin aspart (novoLOG) injection 0-15 Units  0-15 Units Subcutaneous TID WC Cox, Amy N, DO   2 Units at 07/16/23 1200   insulin aspart (novoLOG) injection 0-5 Units  0-5 Units Subcutaneous QHS Cox, Amy N, DO   2 Units at 07/15/23 2144   levothyroxine (SYNTHROID) tablet 25 mcg  25 mcg Oral Q0600 Cox, Amy N, DO   25 mcg at 07/16/23 0520   losartan (COZAAR) tablet 25 mg  25 mg Oral Daily Cox, Amy N, DO   25 mg at 07/16/23 1008   nitrofurantoin (macrocrystal-monohydrate) (MACROBID) capsule 100 mg  100 mg Oral Q12H Cox, Amy N, DO   100 mg at 07/16/23 1008   ondansetron (ZOFRAN) tablet 4 mg  4 mg Oral Q6H PRN Cox, Amy N, DO       Or   ondansetron (ZOFRAN) injection 4 mg  4 mg Intravenous Q6H PRN Cox, Amy N, DO       pantoprazole (PROTONIX) EC tablet 40 mg  40 mg Oral Daily Cox, Amy N, DO   40 mg at 07/16/23 1008   senna-docusate (Senokot-S) tablet 1 tablet  1 tablet Oral QHS PRN Cox, Amy N, DO       sodium chloride flush (NS) 0.9 % injection 3 mL  3 mL Intravenous Q12H Cox, Amy N, DO   3 mL at 07/16/23 1009   traZODone (DESYREL) tablet 50 mg   50 mg Oral QHS Cox, Amy N, DO   50 mg at 07/15/23 2144     Discharge Medications: Please see discharge summary for a list of discharge medications.  Relevant Imaging Results:  Relevant Lab Results:   Additional Information SSN# [id number]  [place] Huberty C Verbie Babic, RN

## 2023-07-16 NOTE — Progress Notes (Signed)
 Physical Therapy Treatment Patient Details Name: Shannon Flores MRN: [id number] DOB: [birth year] Today's Date: 07/16/2023   History of Present Illness 73 year old female with history of GERD, hypothyroid, non-insulin-dependent diabetes mellitus, fibromyalgia, asthma, hypertension, who presents ED for chief concerns of a fall with multiple sites of bruising and syncope    PT Comments  Patient alert, agreeable to PT, reported ongoing R wrist pain though improved from yesterday. Still unable to really bear any weight in it during transfers/functional activities due to pain. Sit <> Stand several times during session, RW and CGA, fair carryover of hand placement. She ambulated 3 bouts of 20-50ft with RW and CGA, fatigued quickly and referenced light headedness, BP 155/91 in sitting after bout. Overall the patient demonstrated decreased activity tolerance, endurance, and decreased ability to perform ADLs, recommend continued skilled PT intervention to return to PLOF.     If plan is discharge home, recommend the following: A little help with walking and/or transfers;A little help with bathing/dressing/bathroom;Assist for transportation;Help with stairs or ramp for entrance;Assistance with cooking/housework   Can travel by private vehicle     Yes  Equipment Recommendations  Other (comment) (TBD)    Recommendations for Other Services       Precautions / Restrictions Precautions Precautions: Fall Recall of Precautions/Restrictions: Intact Restrictions Weight Bearing Restrictions Per Provider Order: No     Mobility  Bed Mobility Overal bed mobility: Needs Assistance Bed Mobility: Supine to Sit     Supine to sit: Contact guard          Transfers Overall transfer level: Needs assistance Equipment used: Rolling walker (2 wheels) Transfers: Sit to/from Stand Sit to Stand: Contact guard assist                Ambulation/Gait Ambulation/Gait assistance: Contact guard  assist Gait Distance (Feet):  (3 bouts of 20-64ft) Assistive device: Rolling walker (2 wheels)         General Gait Details: close chair follow. pt light headed with all mobility. fatigued quickly   Stairs             Wheelchair Mobility     Tilt Bed    Modified Rankin (Stroke Patients Only)       Balance Overall balance assessment: Needs assistance Sitting-balance support: Feet supported Sitting balance-Leahy Scale: Good     Standing balance support: Bilateral upper extremity supported Standing balance-Leahy Scale: Fair                              Hotel manager: No apparent difficulties  Cognition Arousal: Alert Behavior During Therapy: WFL for tasks assessed/performed                             Following commands: Intact      Cueing    Exercises      General Comments        Pertinent Vitals/Pain Pain Assessment Pain Assessment: Faces Faces Pain Scale: Hurts a little bit Pain Location: R wrist Pain Descriptors / Indicators: Discomfort, Aching Pain Intervention(s): Limited activity within patient's tolerance, Monitored during session, Repositioned    Home Living                          Prior Function            PT Goals (current goals can now be found in the  care plan section) Progress towards PT goals: Progressing toward goals    Frequency    Min 2X/week      PT Plan      Co-evaluation              AM-PAC PT 6 Clicks Mobility   Outcome Measure  Help needed turning from your back to your side while in a flat bed without using bedrails?: A Little Help needed moving from lying on your back to sitting on the side of a flat bed without using bedrails?: A Little Help needed moving to and from a bed to a chair (including a wheelchair)?: A Little Help needed standing up from a chair using your arms (e.g., wheelchair or bedside chair)?: A Little Help needed to  walk in hospital room?: A Little Help needed climbing 3-5 steps with a railing? : A Little 6 Click Score: 18    End of Session Equipment Utilized During Treatment: Gait belt Activity Tolerance: Patient tolerated treatment well Patient left: with call bell/phone within reach;with chair alarm set;in chair Nurse Communication: Mobility status PT Visit Diagnosis: Other abnormalities of gait and mobility (R26.89);Difficulty in walking, not elsewhere classified (R26.2);Muscle weakness (generalized) (M62.81)     Time: 8960-8896 PT Time Calculation (min) (ACUTE ONLY): 24 min  Charges:    $Therapeutic Activity: 23-37 mins PT General Charges $$ ACUTE PT VISIT: 1 Visit                     Doyal Shams PT, DPT 12:29 PM,07/16/23

## 2023-07-16 NOTE — Plan of Care (Signed)
  Problem: Education: Goal: Ability to describe self-care measures that may prevent or decrease complications (Diabetes Survival Skills Education) will improve Outcome: Progressing   Problem: Coping: Goal: Ability to adjust to condition or change in health will improve Outcome: Progressing   Problem: Health Behavior/Discharge Planning: Goal: Ability to identify and utilize available resources and services will improve Outcome: Progressing   Problem: Metabolic: Goal: Ability to maintain appropriate glucose levels will improve Outcome: Progressing   Problem: Nutritional: Goal: Maintenance of adequate nutrition will improve Outcome: Progressing   Problem: Skin Integrity: Goal: Risk for impaired skin integrity will decrease Outcome: Progressing   Problem: Cardiac: Goal: Will achieve and/or maintain adequate cardiac output Outcome: Progressing   Problem: Safety: Goal: Ability to remain free from injury will improve Outcome: Progressing

## 2023-07-16 NOTE — Progress Notes (Signed)
 RE: Shannon Flores  Date of Birth: Feb 12, 1950 Date: 07/16/2023     To Whom It May Concern:   Please be advised that the above-named patient will require a short-term nursing home stay - anticipated 30 days or less for rehabilitation and strengthening.  The plan is for return home.

## 2023-07-16 NOTE — Progress Notes (Signed)
 PROGRESS NOTE    Shannon Flores  FMW:[id number] DOB: April 27, 1950 DOA: 07/14/2023 PCP: [name], MD    Brief Narrative:  73 year old female with history of GERD, hypothyroid, non-insulin-dependent diabetes mellitus, fibromyalgia, asthma, hypertension, who presents ED for chief concerns of a fall with multiple sites of bruising and syncope.   Patient reports that she was having some diarrhea and noted that she is on antibiotic so she needs to take her antibiotic with food.  At 3 AM this morning she remembered that she did not eat yet and that since she has been on antibiotic, she should get up and eat something.  She then made herself a peanut butter sandwich.  As she is eating, she felt the food getting stuck in her throat and she could not swallow and could not breathe.  She could not find her phone.  So she intended to run outside the door to come to her neighbors house for help.  In the next thing she remembers she was on the floor and felt bruised all over.  She then noted that there was the food bolus is laying next to her so she must have spit it up or vomited up the food.  She then called EMS.   Patient has bruising and tenderness all over. Patient is able to flex and extend into a gripping motion with her left hand at bedside.  Assessment & Plan:   Principal Problem:   Syncope Active Problems:   History of asthma   History of anxiety   Ecchymosis of left eye   Ecchymosis of forearm   Ecchymosis of wrist   Traumatic ecchymosis of face   Traumatic ecchymosis of hand, left, initial encounter   Traumatic ecchymosis of hand, right, initial encounter   Chronic pain syndrome   Hypothyroidism   Uncontrolled type 2 diabetes mellitus with hyperglycemia, without long-term current use of insulin (HCC)   Recurrent major depressive disorder, in partial remission (HCC)   Prolonged QT syndrome   UTI (urinary tract infection)  Syncope Broad differential at this point.  Possibly  hypoxia in the setting of asphyxiation from fluid bolus.  Patient is on room air and tolerating p.o. however remains significantly ataxic with some element of volume depletion. Has been working with therapy however remains ataxic with only 25 feet of independent walking and associated dizziness and limitation by pain.  I also suspect possible mild concussion not visible on imaging that could be underlying some of the unsteadiness.  2D echocardiogram is reassuring. Plan: Patient will benefit from placement in skilled nursing facility as next level of care. Continue fall precautions and therapy evaluations Periodic orthostatics Can DC IV fluids after 24 hours Can DC telemetry monitoring TOC consult for skilled nursing facility placement  History of anxiety Home alprazolam   History of asthma No evidence of exacerbation.  Continue as needed nebulizer   Traumatic ecchymosis of hand, right, initial encounter Continue with sling in place   Traumatic ecchymosis of face Present on admission, supportive care   Recurrent major depressive disorder, in partial remission (HCC) Trazodone 50 mg nightly resumed   Uncontrolled type 2 diabetes mellitus with hyperglycemia, without long-term current use of insulin (HCC) Oral agents.  SSI with bedtime coverage   Hypothyroidism Continue levothyroxine 25 mcg daily    Chronic pain syndrome PDMP reviewed Alprazolam as above   UTI (urinary tract infection) Present on admission Home nitrofurantoin, 100 mg p.o. twice daily resumed on admission to complete course  Prolonged QT syndrome Monitor serum magnesium    DVT prophylaxis: SQ heparin Code Status: Full Family Communication: None Disposition Plan: Status is: Observation The patient will require care spanning > 2 midnights and should be moved to inpatient because: Unsafe discharge plan.  Persistent unsteadiness on feet.  Will need skilled nursing facility placement.   Level of care:  Telemetry Cardiac  Consultants:  None  Procedures:  None  Antimicrobials: None   Subjective: Seen and examined.  Resting in bed.  Marked ecchymoses.  Still feels sore.  Objective: Vitals:   07/16/23 0435 07/16/23 0500 07/16/23 0749 07/16/23 1206  BP: 117/87  135/89 (!) 120/94  Pulse: 77  76 72  Resp: 18  17 18   Temp: 98.1 F (36.7 C)  98.6 F (37 C) 98 F (36.7 C)  TempSrc:    Oral  SpO2: 96%  94% 97%  Weight:  87.9 kg    Height:        Intake/Output Summary (Last 24 hours) at 07/16/2023 1215 Last data filed at 07/16/2023 1027 Gross per 24 hour  Intake 2042.18 ml  Output --  Net 2042.18 ml   Filed Weights   07/14/23 2145 07/15/23 0500 07/16/23 0500  Weight: 84.8 kg 84.8 kg 87.9 kg    Examination:  General exam: NAD.  Appears fatigued Respiratory system: Lungs clear.  Normal work of breathing.  Room air Cardiovascular system: S1-S2, RRR, no murmurs, no pedal edema Gastrointestinal system: Soft, NT/ND, normal bowel sounds Central nervous system: Alert and oriented. No focal neurological deficits. Extremities: Symmetric 5 x 5 power. Skin: Scattered ecchymoses on face and torso Psychiatry: Judgement and insight appear normal. Mood & affect appropriate.     Data Reviewed: I have personally reviewed following labs and imaging studies  CBC: Recent Labs  Lab 07/14/23 1335 07/15/23 0422 07/16/23 0838  WBC 6.9 6.9 7.5  NEUTROABS  --   --  3.7  HGB 14.9 13.9 13.8  HCT 44.1 40.1 40.3  MCV 89.5 89.5 88.8  PLT 172 143* 145*   Basic Metabolic Panel: Recent Labs  Lab 07/14/23 1335 07/14/23 2023 07/15/23 0422 07/16/23 0838  NA 136  --  138 139  K 3.5  --  2.9* 3.3*  CL 105  --  106 108  CO2 19*  --  25 24  GLUCOSE 174*  --  125* 116*  BUN 16  --  16 19  CREATININE 1.00  --  0.92 0.91  CALCIUM 8.9  --  8.6* 8.6*  MG  --  1.9  --  1.7   GFR: Estimated Creatinine Clearance: 60.5 mL/min (by C-G formula based on SCr of 0.91 mg/dL). Liver Function  Tests: No results for input(s): AST, ALT, ALKPHOS, BILITOT, PROT, ALBUMIN in the last 168 hours. No results for input(s): LIPASE, AMYLASE in the last 168 hours. No results for input(s): AMMONIA in the last 168 hours. Coagulation Profile: No results for input(s): INR, PROTIME in the last 168 hours. Cardiac Enzymes: No results for input(s): CKTOTAL, CKMB, CKMBINDEX, TROPONINI in the last 168 hours. BNP (last 3 results) No results for input(s): PROBNP in the last 8760 hours. HbA1C: Recent Labs    07/15/23 0422  HGBA1C 6.6*   CBG: Recent Labs  Lab 07/15/23 1655 07/15/23 2016 07/16/23 0442 07/16/23 0752 07/16/23 1138  GLUCAP 136* 240* 157* 119* 146*   Lipid Profile: No results for input(s): CHOL, HDL, LDLCALC, TRIG, CHOLHDL, LDLDIRECT in the last 72 hours. Thyroid Function Tests: No results for input(s): TSH,  T4TOTAL, FREET4, T3FREE, THYROIDAB in the last 72 hours. Anemia Panel: No results for input(s): VITAMINB12, FOLATE, FERRITIN, TIBC, IRON, RETICCTPCT in the last 72 hours. Sepsis Labs: No results for input(s): PROCALCITON, LATICACIDVEN in the last 168 hours.  No results found for this or any previous visit (from the past 240 hours).       Radiology Studies: ECHOCARDIOGRAM COMPLETE Result Date: 07/16/2023    ECHOCARDIOGRAM REPORT   Patient Name:   RAMANI RIVA Date of Exam: 07/15/2023 Medical Rec #:  [id number]        Height:       66.0 in Accession #:    [id number]       Weight:       186.9 lb Date of Birth:  05-07-1950       BSA:          1.943 m Patient Age:    72 years         BP:           136/93 mmHg Patient Gender: F                HR:           79 bpm. Exam Location:  [facility] Procedure: 2D Echo, Cardiac Doppler and Color Doppler (Both Spectral and Color            Flow Doppler were utilized during procedure). Indications:     R55 Syncope  History:         Patient has prior history of Echocardiogram  examinations, most                  recent 10/30/2021. Risk Factors:Hypertension, Diabetes and                  Dyslipidemia.  Sonographer:     Carl Coma RDCS Referring Phys:  [id number] CALVIN KATHEE ROBSON Diagnosing Phys: Cara JONETTA Lovelace MD IMPRESSIONS  1. Left ventricular ejection fraction, by estimation, is 55 to 60%. The left ventricle has normal function. The left ventricle has no regional wall motion abnormalities. Left ventricular diastolic parameters are consistent with Grade I diastolic dysfunction (impaired relaxation).  2. Right ventricular systolic function is normal. The right ventricular size is normal.  3. Left atrial size was mildly dilated.  4. The mitral valve is grossly normal. Trivial mitral valve regurgitation.  5. The aortic valve is normal in structure. Aortic valve regurgitation is not visualized. FINDINGS  Left Ventricle: Left ventricular ejection fraction, by estimation, is 55 to 60%. The left ventricle has normal function. The left ventricle has no regional wall motion abnormalities. Strain was performed and the global longitudinal strain is indeterminate. Global longitudinal strain performed but not reported based on interpreter judgement due to suboptimal tracking. The left ventricular internal cavity size was normal in size. There is no left ventricular hypertrophy. Left ventricular diastolic parameters are consistent with Grade I diastolic dysfunction (impaired relaxation). Right Ventricle: The right ventricular size is normal. No increase in right ventricular wall thickness. Right ventricular systolic function is normal. Left Atrium: Left atrial size was mildly dilated. Right Atrium: Right atrial size was normal in size. Pericardium: Trivial pericardial effusion is present. Mitral Valve: The mitral valve is grossly normal. Trivial mitral valve regurgitation. Tricuspid Valve: The tricuspid valve is normal in structure. Tricuspid valve regurgitation is mild. Aortic Valve:  The aortic valve is normal in structure. Aortic valve regurgitation is not visualized. Pulmonic Valve: The pulmonic valve was normal in structure. Pulmonic  valve regurgitation is not visualized. Aorta: The ascending aorta was not well visualized. IAS/Shunts: No atrial level shunt detected by color flow Doppler. Additional Comments: 3D was performed not requiring image post processing on an independent workstation and was indeterminate.  LEFT VENTRICLE PLAX 2D LVIDd:         4.70 cm   Diastology LVIDs:         3.30 cm   LV e' medial:    7.00 cm/s LV PW:         0.90 cm   LV E/e' medial:  10.7 LV IVS:        0.90 cm   LV e' lateral:   9.22 cm/s LVOT diam:     1.90 cm   LV E/e' lateral: 8.1 LV SV:         58 LV SV Index:   30 LVOT Area:     2.84 cm  RIGHT VENTRICLE             IVC RV Basal diam:  3.70 cm     IVC diam: 1.60 cm RV S prime:     12.33 cm/s TAPSE (M-mode): 2.3 cm LEFT ATRIUM             Index        RIGHT ATRIUM           Index LA diam:        4.20 cm 2.16 cm/m   RA Area:     11.50 cm LA Vol (A2C):   30.8 ml 15.85 ml/m  RA Volume:   26.40 ml  13.59 ml/m LA Vol (A4C):   20.2 ml 10.39 ml/m LA Biplane Vol: 25.3 ml 13.02 ml/m  AORTIC VALVE LVOT Vmax:   101.10 cm/s LVOT Vmean:  67.950 cm/s LVOT VTI:    0.203 m  AORTA Ao Root diam: 3.20 cm Ao Asc diam:  3.60 cm MITRAL VALVE MV Area (PHT): 3.35 cm    SHUNTS MV Decel Time: 226 msec    Systemic VTI:  0.20 m MV E velocity: 74.93 cm/s  Systemic Diam: 1.90 cm MV A velocity: 96.23 cm/s MV E/A ratio:  0.78 Dwayne D Callwood MD Electronically signed by Cara JONETTA Lovelace MD Signature Date/Time: 07/16/2023/7:37:37 AM    Final    DG Hand Complete Left Result Date: 07/14/2023 CLINICAL DATA:  fall, L chest wall pain, shoulder pain, hand pain EXAM: LEFT HAND - COMPLETE 3+ VIEW COMPARISON:  None FINDINGS: No fracture or dislocation. Normal alignment. DJD at the first [facility] joint, and multiple interphalangeal joints most marked index and long D IP, little finger PIP.  Regional soft tissues unremarkable. Carpal rows intact. IMPRESSION: 1. No acute findings. 2. Multifocal DJD. Electronically Signed   By: JONETTA Faes M.D.   On: 07/14/2023 17:21   DG Humerus Left Result Date: 07/14/2023 CLINICAL DATA:  Fall EXAM: LEFT HUMERUS - 2+ VIEW COMPARISON:  None Available. FINDINGS: There is no evidence of fracture or other focal bone lesions. Soft tissues are unremarkable. IMPRESSION: Negative. Electronically Signed   By: Greig Pique M.D.   On: 07/14/2023 17:20   DG Ribs Unilateral W/Chest Left Result Date: 07/14/2023 CLINICAL DATA:  Fall and left chest wall pain. EXAM: LEFT RIBS AND CHEST - 3+ VIEW COMPARISON:  Chest radiograph dated 03/13/2008. FINDINGS: No focal consolidation, pleural effusion, pneumothorax. The cardiac silhouette is within limits. No acute osseous pathology. No displaced rib fractures. IMPRESSION: 1. No acute cardiopulmonary process. 2. No displaced rib fractures. Electronically  Signed   By: Arash  Radparvar M.D.   On: 07/14/2023 17:19   CT Maxillofacial Wo Contrast Result Date: 07/14/2023 CLINICAL DATA:  Trauma, fall, bruising to forehead and left eye. EXAM: CT HEAD WITHOUT CONTRAST CT MAXILLOFACIAL WITHOUT CONTRAST CT CERVICAL SPINE WITHOUT CONTRAST TECHNIQUE: Multidetector CT imaging of the head, cervical spine, and maxillofacial structures were performed using the standard protocol without intravenous contrast. Multiplanar CT image reconstructions of the cervical spine and maxillofacial structures were also generated. RADIATION DOSE REDUCTION: This exam was performed according to the departmental dose-optimization program which includes automated exposure control, adjustment of the mA and/or kV according to patient size and/or use of iterative reconstruction technique. COMPARISON:  CT head 01/28/2023 MRI cervical spine 06/27/2021. FINDINGS: CT HEAD FINDINGS Brain: No acute intracranial hemorrhage. No CT evidence of acute infarct. Nonspecific hypoattenuation  in the periventricular and subcortical white matter favored to reflect chronic microvascular ischemic changes. Remote lacunar infarcts in the bilateral basal ganglia and in the right aspect of the pons. Mild generalized parenchymal volume loss. No edema, mass effect, or midline shift. The basilar cisterns are patent. Ventricles: The ventricles are normal. Vascular: Atherosclerotic calcifications of the carotid siphons and intracranial vertebral arteries. No hyperdense vessel. Skull: No acute or aggressive finding. Other: Mastoid air cells are clear. CT MAXILLOFACIAL FINDINGS Osseous: No fracture or mandibular dislocation. No destructive process. Orbits: Globes are intact. Lenses are normally located. Extraocular muscles and optic nerve sheath complexes are unremarkable. There is mild swelling of the preseptal soft tissues of the left orbit particularly along the inferior aspect. Sinuses: No significant mucosal thickening.  No air-fluid levels. Soft tissues: Hematoma in the left frontal scalp with surrounding soft tissue swelling which extends inferiorly over the supraorbital ridge and glabella with soft tissue swelling noted extending along the superolateral aspect of the left orbit. CT CERVICAL SPINE FINDINGS Alignment: Normal. Skull base and vertebrae: No acute fracture. No primary bone lesion or focal pathologic process. Soft tissues and spinal canal: No prevertebral fluid or swelling. No visible canal hematoma. Disc levels: Severe disc space narrowing at C5-6. Additional moderate disc space narrowing at C4-5 and C6-7. Disc osteophyte complexes at multiple levels. Moderate spinal canal stenosis at C4-5 and C5-6. Facet arthrosis and uncovertebral hypertrophy at multiple levels. Severe foraminal stenosis on the right at C4-5. Moderate to severe foraminal stenosis bilaterally at C5-6. Upper chest: Negative. Other: None. IMPRESSION: No CT evidence of acute intracranial abnormality. No acute fracture or traumatic  malalignment of the cervical spine. No acute maxillofacial fracture. Left frontal scalp hematoma with surrounding soft tissue swelling. Left facial and periorbital soft tissue swelling as above. Extensive chronic microvascular ischemic changes and mild parenchymal volume loss. Degenerative changes as above. Electronically Signed   By: Donnice Mania M.D.   On: 07/14/2023 15:38   CT HEAD WO CONTRAST ( ) Result Date: 07/14/2023 CLINICAL DATA:  Trauma, fall, bruising to forehead and left eye. EXAM: CT HEAD WITHOUT CONTRAST CT MAXILLOFACIAL WITHOUT CONTRAST CT CERVICAL SPINE WITHOUT CONTRAST TECHNIQUE: Multidetector CT imaging of the head, cervical spine, and maxillofacial structures were performed using the standard protocol without intravenous contrast. Multiplanar CT image reconstructions of the cervical spine and maxillofacial structures were also generated. RADIATION DOSE REDUCTION: This exam was performed according to the departmental dose-optimization program which includes automated exposure control, adjustment of the mA and/or kV according to patient size and/or use of iterative reconstruction technique. COMPARISON:  CT head 01/28/2023 MRI cervical spine 06/27/2021. FINDINGS: CT HEAD FINDINGS Brain: No acute intracranial hemorrhage. No  CT evidence of acute infarct. Nonspecific hypoattenuation in the periventricular and subcortical white matter favored to reflect chronic microvascular ischemic changes. Remote lacunar infarcts in the bilateral basal ganglia and in the right aspect of the pons. Mild generalized parenchymal volume loss. No edema, mass effect, or midline shift. The basilar cisterns are patent. Ventricles: The ventricles are normal. Vascular: Atherosclerotic calcifications of the carotid siphons and intracranial vertebral arteries. No hyperdense vessel. Skull: No acute or aggressive finding. Other: Mastoid air cells are clear. CT MAXILLOFACIAL FINDINGS Osseous: No fracture or mandibular  dislocation. No destructive process. Orbits: Globes are intact. Lenses are normally located. Extraocular muscles and optic nerve sheath complexes are unremarkable. There is mild swelling of the preseptal soft tissues of the left orbit particularly along the inferior aspect. Sinuses: No significant mucosal thickening.  No air-fluid levels. Soft tissues: Hematoma in the left frontal scalp with surrounding soft tissue swelling which extends inferiorly over the supraorbital ridge and glabella with soft tissue swelling noted extending along the superolateral aspect of the left orbit. CT CERVICAL SPINE FINDINGS Alignment: Normal. Skull base and vertebrae: No acute fracture. No primary bone lesion or focal pathologic process. Soft tissues and spinal canal: No prevertebral fluid or swelling. No visible canal hematoma. Disc levels: Severe disc space narrowing at C5-6. Additional moderate disc space narrowing at C4-5 and C6-7. Disc osteophyte complexes at multiple levels. Moderate spinal canal stenosis at C4-5 and C5-6. Facet arthrosis and uncovertebral hypertrophy at multiple levels. Severe foraminal stenosis on the right at C4-5. Moderate to severe foraminal stenosis bilaterally at C5-6. Upper chest: Negative. Other: None. IMPRESSION: No CT evidence of acute intracranial abnormality. No acute fracture or traumatic malalignment of the cervical spine. No acute maxillofacial fracture. Left frontal scalp hematoma with surrounding soft tissue swelling. Left facial and periorbital soft tissue swelling as above. Extensive chronic microvascular ischemic changes and mild parenchymal volume loss. Degenerative changes as above. Electronically Signed   By: Donnice Mania M.D.   On: 07/14/2023 15:38   CT Cervical Spine Wo Contrast Result Date: 07/14/2023 CLINICAL DATA:  Trauma, fall, bruising to forehead and left eye. EXAM: CT HEAD WITHOUT CONTRAST CT MAXILLOFACIAL WITHOUT CONTRAST CT CERVICAL SPINE WITHOUT CONTRAST TECHNIQUE:  Multidetector CT imaging of the head, cervical spine, and maxillofacial structures were performed using the standard protocol without intravenous contrast. Multiplanar CT image reconstructions of the cervical spine and maxillofacial structures were also generated. RADIATION DOSE REDUCTION: This exam was performed according to the departmental dose-optimization program which includes automated exposure control, adjustment of the mA and/or kV according to patient size and/or use of iterative reconstruction technique. COMPARISON:  CT head 01/28/2023 MRI cervical spine 06/27/2021. FINDINGS: CT HEAD FINDINGS Brain: No acute intracranial hemorrhage. No CT evidence of acute infarct. Nonspecific hypoattenuation in the periventricular and subcortical white matter favored to reflect chronic microvascular ischemic changes. Remote lacunar infarcts in the bilateral basal ganglia and in the right aspect of the pons. Mild generalized parenchymal volume loss. No edema, mass effect, or midline shift. The basilar cisterns are patent. Ventricles: The ventricles are normal. Vascular: Atherosclerotic calcifications of the carotid siphons and intracranial vertebral arteries. No hyperdense vessel. Skull: No acute or aggressive finding. Other: Mastoid air cells are clear. CT MAXILLOFACIAL FINDINGS Osseous: No fracture or mandibular dislocation. No destructive process. Orbits: Globes are intact. Lenses are normally located. Extraocular muscles and optic nerve sheath complexes are unremarkable. There is mild swelling of the preseptal soft tissues of the left orbit particularly along the inferior aspect. Sinuses: No significant  mucosal thickening.  No air-fluid levels. Soft tissues: Hematoma in the left frontal scalp with surrounding soft tissue swelling which extends inferiorly over the supraorbital ridge and glabella with soft tissue swelling noted extending along the superolateral aspect of the left orbit. CT CERVICAL SPINE FINDINGS  Alignment: Normal. Skull base and vertebrae: No acute fracture. No primary bone lesion or focal pathologic process. Soft tissues and spinal canal: No prevertebral fluid or swelling. No visible canal hematoma. Disc levels: Severe disc space narrowing at C5-6. Additional moderate disc space narrowing at C4-5 and C6-7. Disc osteophyte complexes at multiple levels. Moderate spinal canal stenosis at C4-5 and C5-6. Facet arthrosis and uncovertebral hypertrophy at multiple levels. Severe foraminal stenosis on the right at C4-5. Moderate to severe foraminal stenosis bilaterally at C5-6. Upper chest: Negative. Other: None. IMPRESSION: No CT evidence of acute intracranial abnormality. No acute fracture or traumatic malalignment of the cervical spine. No acute maxillofacial fracture. Left frontal scalp hematoma with surrounding soft tissue swelling. Left facial and periorbital soft tissue swelling as above. Extensive chronic microvascular ischemic changes and mild parenchymal volume loss. Degenerative changes as above. Electronically Signed   By: Donnice Mania M.D.   On: 07/14/2023 15:38   DG Wrist Complete Right Result Date: 07/14/2023 CLINICAL DATA:  Pain after fall. EXAM: RIGHT WRIST - COMPLETE 3+ VIEW COMPARISON:  None Available. FINDINGS: There is no evidence of acute fracture or dislocation. Mild radiocarpal and first CMC joint space narrowing. 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 No radiopaque foreign body. IMPRESSION: 1. No acute osseous abnormality. 2. Mild radiocarpal and first CMC joint space narrowing. 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 Electronically Signed   By: Harrietta Sherry M.D.   On: 07/14/2023 14:36        Scheduled Meds:  aspirin EC  81 mg Oral Daily   atenolol  100 mg Oral Daily   heparin  5,000 Units Subcutaneous Q8H   insulin aspart  0-15 Units Subcutaneous TID WC   insulin aspart  0-5 Units Subcutaneous QHS   levothyroxine  25 mcg Oral Q0600   losartan  25 mg Oral  Daily   nitrofurantoin (macrocrystal-monohydrate)  100 mg Oral Q12H   pantoprazole  40 mg Oral Daily   sodium chloride flush  3 mL Intravenous Q12H   traZODone  50 mg Oral QHS   Continuous Infusions:  sodium chloride 100 mL/hr at 07/16/23 0443     LOS: 0 days     Calvin KATHEE Robson, MD [facility]   If 7PM-7AM, please contact night-coverage  07/16/2023, 12:15 PM

## 2023-07-16 NOTE — Progress Notes (Signed)
 Occupational Therapy Treatment Patient Details Name: Shannon Flores MRN: [id number] DOB: 1950-02-16 Today's Date: 07/16/2023   History of present illness 73 year old female with history of GERD, hypothyroid, non-insulin-dependent diabetes mellitus, fibromyalgia, asthma, hypertension, who presents ED for chief concerns of a fall with multiple sites of bruising and syncope   OT comments  Pt seen for OT tx. Pt noted to be very close to the edge of the bed, at risk of falling off. Pt acknowledges this but does not appear to take any correction, demonstrating questionable insight/safety awareness. Pt educated in RUE positioning and elevation to support pain mgt and swelling, provided with fresh ice pack per pt request, educated in use. Educated in bed positioning to improve comfort and minimize back pain. Pt verbalized understanding, able to self correct positioning and pillows placed under RUE to help with elevation. Pt continues to benefit from skilled OT services. Will continue to progress.       If plan is discharge home, recommend the following:  A little help with walking and/or transfers;A little help with bathing/dressing/bathroom;Assistance with cooking/housework;Assist for transportation;Help with stairs or ramp for entrance   Equipment Recommendations  BSC/3in1;Other (comment) (RW)    Recommendations for Other Services      Precautions / Restrictions Precautions Precautions: Fall Recall of Precautions/Restrictions: Intact Restrictions Weight Bearing Restrictions Per Provider Order: No       Mobility Bed Mobility Overal bed mobility: Needs Assistance Bed Mobility: Supine to Sit, Sit to Supine     Supine to sit: Supervision Sit to supine: Supervision   General bed mobility comments: increased effort and time to complete    Transfers                         Balance Overall balance assessment: Needs assistance Sitting-balance support: Feet supported Sitting  balance-Leahy Scale: Good                                     ADL either performed or assessed with clinical judgement   ADL Overall ADL's : Needs assistance/impaired                 Upper Body Dressing : Sitting;Minimal assistance                          Extremity/Trunk Assessment              Vision       Perception     Praxis     Communication Communication Communication: No apparent difficulties   Cognition Arousal: Alert Behavior During Therapy: WFL for tasks assessed/performed Cognition: No family/caregiver present to determine baseline             OT - Cognition Comments: questionable safety awareness                 Following commands: Intact        Cueing   Cueing Techniques: Verbal cues  Exercises Other Exercises Other Exercises: Pt educated in RUE positioning and elevation to support pain mgt and swelling, provided with fresh ice pack per pt request, educated in use. Educated in bed positioning to improve comfort and minimize back pain.    Shoulder Instructions       General Comments      Pertinent Vitals/ Pain       Pain Assessment Pain Assessment:  Faces Faces Pain Scale: Hurts little more Pain Location: R wrist Pain Descriptors / Indicators: Discomfort, Aching Pain Intervention(s): Limited activity within patient's tolerance, Monitored during session, Repositioned, Ice applied  Home Living                                          Prior Functioning/Environment              Frequency  Min 2X/week        Progress Toward Goals  OT Goals(current goals can now be found in the care plan section)  Progress towards OT goals: Progressing toward goals  Acute Rehab OT Goals Patient Stated Goal: feel better OT Goal Formulation: With patient Time For Goal Achievement: 07/29/23 Potential to Achieve Goals: Fair  Plan      Co-evaluation                 AM-PAC OT  6 Clicks Daily Activity     Outcome Measure   Help from another person eating meals?: None Help from another person taking care of personal grooming?: None Help from another person toileting, which includes using toliet, bedpan, or urinal?: A Little Help from another person bathing (including washing, rinsing, drying)?: A Little Help from another person to put on and taking off regular upper body clothing?: A Little Help from another person to put on and taking off regular lower body clothing?: A Little 6 Click Score: 20    End of Session    OT Visit Diagnosis: Unsteadiness on feet (R26.81);Repeated falls (R29.6);Muscle weakness (generalized) (M62.81)   Activity Tolerance Patient tolerated treatment well   Patient Left in bed;with call bell/phone within reach;with bed alarm set   Nurse Communication          Time: 8470-8458 OT Time Calculation (min): 12 min  Charges: OT General Charges $OT Visit: 1 Visit OT Treatments $Therapeutic Activity: 8-22 mins  Warren SAUNDERS., MPH, MS, OTR/L ascom [phone] 07/16/23, 4:06 PM

## 2023-07-17 DIAGNOSIS — R55 Syncope and collapse: Secondary | ICD-10-CM | POA: Diagnosis not present

## 2023-07-17 LAB — GLUCOSE, CAPILLARY
Glucose-Capillary: 133 mg/dL — ABNORMAL HIGH (ref 70–99)
Glucose-Capillary: 151 mg/dL — ABNORMAL HIGH (ref 70–99)
Glucose-Capillary: 169 mg/dL — ABNORMAL HIGH (ref 70–99)
Glucose-Capillary: 120 mg/dL — ABNORMAL HIGH (ref 70–99)
Glucose-Capillary: 147 mg/dL — ABNORMAL HIGH (ref 70–99)

## 2023-07-17 MED ORDER — FAMOTIDINE 20 MG PO TABS
20.0000 mg | ORAL_TABLET | Freq: Two times a day (BID) | ORAL | Status: DC
  Administered 2023-07-17 – 2023-07-27 (×21): 20 mg via ORAL
  Filled 2023-07-17 (×21): qty 1

## 2023-07-17 MED ORDER — LORATADINE 10 MG PO TABS
10.0000 mg | ORAL_TABLET | Freq: Every day | ORAL | Status: DC
  Administered 2023-07-17 – 2023-07-27 (×11): 10 mg via ORAL
  Filled 2023-07-17 (×11): qty 1

## 2023-07-17 NOTE — TOC Progression Note (Signed)
 Transition of Care [facility]) - Progression Note    Patient Details  Name: Shannon Flores MRN: [id number] Date of Birth: Mar 26, 1950  [facility] CM/SW Contact  Quintella Suzen Jansky, RN Phone Number: 07/17/2023, 12:12 PM  Clinical Narrative:     PASRR pending clinical review. FL2 and bed search completed. Patient pending bed offers.       Expected Discharge Plan and Services                                               Social Determinants of Health (SDOH) Interventions SDOH Screenings   Food Insecurity: No Food Insecurity (07/14/2023)  Recent Concern: Food Insecurity - Food Insecurity Present (04/29/2023)   Received from [facility]  Housing: Low Risk  (07/14/2023)  Transportation Needs: Unmet Transportation Needs (07/14/2023)  Utilities: Not At Risk (07/14/2023)  Financial Resource Strain: Medium Risk (04/29/2023)   Received from [facility]  Physical Activity: Inactive (04/29/2023)   Received from [facility]  Social Connections: Moderately Isolated (07/14/2023)  Stress: Stress Concern Present (04/29/2023)   Received from [facility]  Tobacco Use: Low Risk  (07/14/2023)  Health Literacy: Adequate Health Literacy (04/29/2023)   Received from [facility]    Readmission Risk Interventions     No data to display

## 2023-07-17 NOTE — TOC Progression Note (Signed)
 Transition of Care [facility]) - Progression Note    Patient Details  Name: PETRICE BEEDY MRN: [id number] Date of Birth: 05-Mar-1950  [facility]) CM/SW Contact  Tomasa JAYSON Childes, RN Phone Number: 07/17/2023, 3:54 PM  Clinical Narrative:    Spoke with patient at bedside. She is agreeable to bed search for [facility] and [facility] facilities.         Expected Discharge Plan and Services                                               Social Determinants of Health (SDOH) Interventions SDOH Screenings   Food Insecurity: No Food Insecurity (07/14/2023)  Recent Concern: Food Insecurity - Food Insecurity Present (04/29/2023)   Received from [facility]  Housing: Low Risk  (07/14/2023)  Transportation Needs: Unmet Transportation Needs (07/14/2023)  Utilities: Not At Risk (07/14/2023)  Financial Resource Strain: Medium Risk (04/29/2023)   Received from [facility]  Physical Activity: Inactive (04/29/2023)   Received from [facility]  Social Connections: Moderately Isolated (07/14/2023)  Stress: Stress Concern Present (04/29/2023)   Received from [facility]  Tobacco Use: Low Risk  (07/14/2023)  Health Literacy: Adequate Health Literacy (04/29/2023)   Received from [facility]    Readmission Risk Interventions     No data to display

## 2023-07-17 NOTE — Progress Notes (Signed)
 Physical Therapy Treatment Patient Details Name: MAEDELL HEDGER MRN: [id number] DOB: 10-Apr-1950 Today's Date: 07/17/2023   History of Present Illness 73 year old female with history of GERD, hypothyroid, non-insulin -dependent diabetes mellitus, fibromyalgia, asthma, hypertension, who presents ED for chief concerns of a fall with multiple sites of bruising and syncope    PT Comments  Today's tx involved continuation of ambulation with education on importance of general mobility. Pt with minimal verbal cues during bed mobility, no concerns with pain. STS transfer with CGA and use of RW, no LOB experienced although pt continues to note she feels unsteady at times. Ambulation successful x60' with RW and CGA, pt requiring x3 standing rest breaks throughout, no LOB experienced. Pt will continue to benefit from skilled PT interventions to meet goals and improve QoL. PT to follow acutely.     If plan is discharge home, recommend the following: A little help with walking and/or transfers;A little help with bathing/dressing/bathroom;Assist for transportation;Help with stairs or ramp for entrance;Assistance with cooking/housework   Can travel by private vehicle     Yes  Equipment Recommendations  None recommended by PT    Recommendations for Other Services       Precautions / Restrictions Precautions Precautions: Fall Recall of Precautions/Restrictions: Intact Restrictions Weight Bearing Restrictions Per Provider Order: No     Mobility  Bed Mobility Overal bed mobility: Needs Assistance Bed Mobility: Supine to Sit     Supine to sit: Supervision     General bed mobility comments: increased time to complete movement but no verbal cues needed to complete    Transfers Overall transfer level: Needs assistance Equipment used: Rolling walker (2 wheels) Transfers: Sit to/from Stand Sit to Stand: Contact guard assist           General transfer comment: increased time to complete  movement, initial verbal cue needed for start of movement, no LOB experienced    Ambulation/Gait Ambulation/Gait assistance: Contact guard assist Gait Distance (Feet): 60 Feet Assistive device: Rolling walker (2 wheels) Gait Pattern/deviations: Step-through pattern, Decreased step length - right, Decreased step length - left       General Gait Details: x3 standing rest breaks, pt stating she felt unsteady throughout but no LOB experienced   Stairs             Wheelchair Mobility     Tilt Bed    Modified Rankin (Stroke Patients Only)       Balance Overall balance assessment: Needs assistance Sitting-balance support: Feet supported Sitting balance-Leahy Scale: Good Sitting balance - Comments: sitting EOB, no LOB   Standing balance support: Bilateral upper extremity supported Standing balance-Leahy Scale: Fair Standing balance comment: standing with RW, no LOB, pt stating she felt unsteady at times                            Communication Communication Communication: No apparent difficulties  Cognition Arousal: Alert Behavior During Therapy: WFL for tasks assessed/performed   PT - Cognitive impairments: No apparent impairments                         Following commands: Intact      Cueing Cueing Techniques: Verbal cues  Exercises      General Comments        Pertinent Vitals/Pain Pain Assessment Pain Assessment: No/denies pain    Home Living  Prior Function            PT Goals (current goals can now be found in the care plan section) Acute Rehab PT Goals Patient Stated Goal: to go home when she feels better PT Goal Formulation: With patient Time For Goal Achievement: 07/29/23 Potential to Achieve Goals: Good Progress towards PT goals: Progressing toward goals    Frequency    Min 2X/week      PT Plan      Co-evaluation              AM-PAC PT 6 Clicks Mobility    Outcome Measure  Help needed turning from your back to your side while in a flat bed without using bedrails?: A Little Help needed moving from lying on your back to sitting on the side of a flat bed without using bedrails?: A Little Help needed moving to and from a bed to a chair (including a wheelchair)?: A Little Help needed standing up from a chair using your arms (e.g., wheelchair or bedside chair)?: A Little Help needed to walk in hospital room?: A Little Help needed climbing 3-5 steps with a railing? : A Lot 6 Click Score: 17    End of Session Equipment Utilized During Treatment: Gait belt Activity Tolerance: Patient tolerated treatment well Patient left: with call bell/phone within reach;with chair alarm set;in chair Nurse Communication: Mobility status PT Visit Diagnosis: Other abnormalities of gait and mobility (R26.89);Difficulty in walking, not elsewhere classified (R26.2);Muscle weakness (generalized) (M62.81)     Time: 8658-8641 PT Time Calculation (min) (ACUTE ONLY): 17 min  Charges:                              Sugar Vanzandt Romero-Perozo, SPT  07/17/2023, 2:28 PM

## 2023-07-17 NOTE — Plan of Care (Signed)
  Problem: Education: Goal: Ability to describe self-care measures that may prevent or decrease complications (Diabetes Survival Skills Education) will improve Outcome: Progressing   Problem: Health Behavior/Discharge Planning: Goal: Ability to identify and utilize available resources and services will improve Outcome: Progressing Goal: Ability to manage health-related needs will improve Outcome: Progressing   Problem: Skin Integrity: Goal: Risk for impaired skin integrity will decrease Outcome: Progressing   Problem: Clinical Measurements: Goal: Diagnostic test results will improve Outcome: Progressing   Problem: Safety: Goal: Ability to remain free from injury will improve Outcome: Progressing

## 2023-07-17 NOTE — Progress Notes (Signed)
 PROGRESS NOTE    Shannon Flores  FMW:[id number] DOB: 01-May-1950 DOA: 07/14/2023 PCP: Shannon Ophelia [name], MD    Brief Narrative:  73 year old female with history of GERD, hypothyroid, non-insulin -dependent diabetes mellitus, fibromyalgia, asthma, hypertension, who presents ED for chief concerns of a fall with multiple sites of bruising and syncope.   Patient reports that she was having some diarrhea and noted that she is on antibiotic so she needs to take her antibiotic with food.  At 3 AM this morning she remembered that she did not eat yet and that since she has been on antibiotic, she should get up and eat something.  She then made herself a peanut butter sandwich.  As she is eating, she felt the food getting stuck in her throat and she could not swallow and could not breathe.  She could not find her phone.  So she intended to run outside the door to come to her neighbors house for help.  In the next thing she remembers she was on the floor and felt bruised all over.  She then noted that there was the food bolus is laying next to her so she must have spit it up or vomited up the food.  She then called EMS.   Patient has bruising and tenderness all over. Patient is able to flex and extend into a gripping motion with her left hand at bedside.  Assessment & Plan:   Principal Problem:   Syncope Active Problems:   History of asthma   History of anxiety   Ecchymosis of left eye   Ecchymosis of forearm   Ecchymosis of wrist   Traumatic ecchymosis of face   Traumatic ecchymosis of hand, left, initial encounter   Traumatic ecchymosis of hand, right, initial encounter   Chronic pain syndrome   Hypothyroidism   Uncontrolled type 2 diabetes mellitus with hyperglycemia, without long-term current use of insulin  (HCC)   Recurrent major depressive disorder, in partial remission (HCC)   Prolonged QT syndrome   UTI (urinary tract infection)  Syncope Suspected postconcussion syndrome Broad  differential at this point.  Possibly hypoxia in the setting of asphyxiation from fluid bolus.  Patient is on room air and tolerating p.o. however remains significantly ataxic with some element of volume depletion. Has been working with therapy however remains ataxic with only 25 feet of independent walking and associated dizziness and limitation by pain.  I also suspect possible mild concussion not visible on imaging that could be underlying some of the unsteadiness.  2D echocardiogram is reassuring.  Plan: Patient will benefit from placement in skilled nursing facility as next level of care. Continue fall precautions and therapy evaluations Every shift orthostatics Consulted for skilled nursing facility placement Medically stable for discharge  History of anxiety Continue Home alprazolam    History of asthma No evidence of exacerbation.  Continue as needed nebulizer   Traumatic ecchymosis of hand, right, initial encounter Continue with sling in place   Traumatic ecchymosis of face Present on admission, supportive care   Recurrent major depressive disorder, in partial remission (HCC) Trazodone  50 mg nightly resumed   Uncontrolled type 2 diabetes mellitus with hyperglycemia, without long-term current use of insulin  (HCC) Oral agents.  SSI with bedtime coverage   Hypothyroidism Continue levothyroxine  25 mcg daily    Chronic pain syndrome PDMP reviewed Alprazolam  as above   UTI (urinary tract infection) Present on admission Home nitrofurantoin , 100 mg p.o. twice daily resumed on admission to complete course   Prolonged QT  syndrome Monitor serum magnesium    DVT prophylaxis: SQ heparin  Code Status: Full Family Communication: None Disposition Plan: Status is: Observation The patient will require care spanning > 2 midnights and should be moved to inpatient because: Ataxic gait.  Suspected postconcussive syndrome.  Unsafe discharge plan.  Will need skilled nurse facility  placement..   Level of care: Telemetry Cardiac  Consultants:  None  Procedures:  None  Antimicrobials: None   Subjective: Seen and examined.  Resting in bed.  Remains sore.  Objective: Vitals:   07/17/23 0416 07/17/23 0500 07/17/23 0750 07/17/23 0804  BP: 136/86  (!) 129/107 (!) 140/96  Pulse: 75  80 79  Resp: 18  18 18   Temp: 98.5 F (36.9 C)  98.2 F (36.8 C) 98.2 F (36.8 C)  TempSrc:      SpO2: 95%  96% 96%  Weight:  92.7 kg    Height:        Intake/Output Summary (Last 24 hours) at 07/17/2023 1100 Last data filed at 07/17/2023 1028 Gross per 24 hour  Intake 360 ml  Output 200 ml  Net 160 ml   Filed Weights   07/15/23 0500 07/16/23 0500 07/17/23 0500  Weight: 84.8 kg 87.9 kg 92.7 kg    Examination:  General exam: No acute distress.  Appears fatigued Respiratory system: Lungs clear.  Normal work of breathing.  Room air Cardiovascular system: S1-S2, RRR, no murmurs, no pedal edema Gastrointestinal system: Soft, NT/ND, normal bowel sounds Central nervous system: Alert and oriented. No focal neurological deficits. Extremities: Symmetric 5 x 5 power. Skin: Scattered ecchymoses on face and torso Psychiatry: Judgement and insight appear normal. Mood & affect appropriate.     Data Reviewed: I have personally reviewed following labs and imaging studies  CBC: Recent Labs  Lab 07/14/23 1335 07/15/23 0422 07/16/23 0838  WBC 6.9 6.9 7.5  NEUTROABS  --   --  3.7  HGB 14.9 13.9 13.8  HCT 44.1 40.1 40.3  MCV 89.5 89.5 88.8  PLT 172 143* 145*   Basic Metabolic Panel: Recent Labs  Lab 07/14/23 1335 07/14/23 2023 07/15/23 0422 07/16/23 0838  NA 136  --  138 139  K 3.5  --  2.9* 3.3*  CL 105  --  106 108  CO2 19*  --  25 24  GLUCOSE 174*  --  125* 116*  BUN 16  --  16 19  CREATININE 1.00  --  0.92 0.91  CALCIUM 8.9  --  8.6* 8.6*  MG  --  1.9  --  1.7   GFR: Estimated Creatinine Clearance: 62.2 mL/min (by C-G formula based on SCr of 0.91  mg/dL). Liver Function Tests: No results for input(s): AST, ALT, ALKPHOS, BILITOT, PROT, ALBUMIN in the last 168 hours. No results for input(s): LIPASE, AMYLASE in the last 168 hours. No results for input(s): AMMONIA in the last 168 hours. Coagulation Profile: No results for input(s): INR, PROTIME in the last 168 hours. Cardiac Enzymes: No results for input(s): CKTOTAL, CKMB, CKMBINDEX, TROPONINI in the last 168 hours. BNP (last 3 results) No results for input(s): PROBNP in the last 8760 hours. HbA1C: Recent Labs    07/15/23 0422  HGBA1C 6.6*   CBG: Recent Labs  Lab 07/16/23 1138 07/16/23 1559 07/16/23 2030 07/17/23 0451 07/17/23 0759  GLUCAP 146* 160* 183* 133* 151*   Lipid Profile: No results for input(s): CHOL, HDL, LDLCALC, TRIG, CHOLHDL, LDLDIRECT in the last 72 hours. Thyroid Function Tests: No results for input(s): TSH,  T4TOTAL, FREET4, T3FREE, THYROIDAB in the last 72 hours. Anemia Panel: No results for input(s): VITAMINB12, FOLATE, FERRITIN, TIBC, IRON, RETICCTPCT in the last 72 hours. Sepsis Labs: No results for input(s): PROCALCITON, LATICACIDVEN in the last 168 hours.  No results found for this or any previous visit (from the past 240 hours).       Radiology Studies: ECHOCARDIOGRAM COMPLETE Result Date: 07/16/2023    ECHOCARDIOGRAM REPORT   Patient Name:   Shannon Flores Date of Exam: 07/15/2023 Medical Rec #:  [id number]        Height:       66.0 in Accession #:    [id number]       Weight:       186.9 lb Date of Birth:  [birth year]       BSA:          1.943 m Patient Age:    72 years         BP:           136/93 mmHg Patient Gender: F                HR:           79 bpm. Exam Location:  [facility] Procedure: 2D Echo, Cardiac Doppler and Color Doppler (Both Spectral and Color            Flow Doppler were utilized during procedure). Indications:     R55 Syncope  History:         Patient has prior  history of Echocardiogram examinations, most                  recent 10/30/2021. Risk Factors:Hypertension, Diabetes and                  Dyslipidemia.  Sonographer:     Carl Coma RDCS Referring Phys:  [id number] CALVIN KATHEE ROBSON Diagnosing Phys: Cara JONETTA Lovelace MD IMPRESSIONS  1. Left ventricular ejection fraction, by estimation, is 55 to 60%. The left ventricle has normal function. The left ventricle has no regional wall motion abnormalities. Left ventricular diastolic parameters are consistent with Grade I diastolic dysfunction (impaired relaxation).  2. Right ventricular systolic function is normal. The right ventricular size is normal.  3. Left atrial size was mildly dilated.  4. The mitral valve is grossly normal. Trivial mitral valve regurgitation.  5. The aortic valve is normal in structure. Aortic valve regurgitation is not visualized. FINDINGS  Left Ventricle: Left ventricular ejection fraction, by estimation, is 55 to 60%. The left ventricle has normal function. The left ventricle has no regional wall motion abnormalities. Strain was performed and the global longitudinal strain is indeterminate. Global longitudinal strain performed but not reported based on interpreter judgement due to suboptimal tracking. The left ventricular internal cavity size was normal in size. There is no left ventricular hypertrophy. Left ventricular diastolic parameters are consistent with Grade I diastolic dysfunction (impaired relaxation). Right Ventricle: The right ventricular size is normal. No increase in right ventricular wall thickness. Right ventricular systolic function is normal. Left Atrium: Left atrial size was mildly dilated. Right Atrium: Right atrial size was normal in size. Pericardium: Trivial pericardial effusion is present. Mitral Valve: The mitral valve is grossly normal. Trivial mitral valve regurgitation. Tricuspid Valve: The tricuspid valve is normal in structure. Tricuspid valve regurgitation  is mild. Aortic Valve: The aortic valve is normal in structure. Aortic valve regurgitation is not visualized. Pulmonic Valve: The pulmonic valve was normal in structure. Pulmonic  valve regurgitation is not visualized. Aorta: The ascending aorta was not well visualized. IAS/Shunts: No atrial level shunt detected by color flow Doppler. Additional Comments: 3D was performed not requiring image post processing on an independent workstation and was indeterminate.  LEFT VENTRICLE PLAX 2D LVIDd:         4.70 cm   Diastology LVIDs:         3.30 cm   LV e' medial:    7.00 cm/s LV PW:         0.90 cm   LV E/e' medial:  10.7 LV IVS:        0.90 cm   LV e' lateral:   9.22 cm/s LVOT diam:     1.90 cm   LV E/e' lateral: 8.1 LV SV:         58 LV SV Index:   30 LVOT Area:     2.84 cm  RIGHT VENTRICLE             IVC RV Basal diam:  3.70 cm     IVC diam: 1.60 cm RV S prime:     12.33 cm/s TAPSE (M-mode): 2.3 cm LEFT ATRIUM             Index        RIGHT ATRIUM           Index LA diam:        4.20 cm 2.16 cm/m   RA Area:     11.50 cm LA Vol (A2C):   30.8 ml 15.85 ml/m  RA Volume:   26.40 ml  13.59 ml/m LA Vol (A4C):   20.2 ml 10.39 ml/m LA Biplane Vol: 25.3 ml 13.02 ml/m  AORTIC VALVE LVOT Vmax:   101.10 cm/s LVOT Vmean:  67.950 cm/s LVOT VTI:    0.203 m  AORTA Ao Root diam: 3.20 cm Ao Asc diam:  3.60 cm MITRAL VALVE MV Area (PHT): 3.35 cm    SHUNTS MV Decel Time: 226 msec    Systemic VTI:  0.20 m MV E velocity: 74.93 cm/s  Systemic Diam: 1.90 cm MV A velocity: 96.23 cm/s MV E/A ratio:  0.78 Dwayne D Callwood MD Electronically signed by Cara JONETTA Lovelace MD Signature Date/Time: 07/16/2023/7:37:37 AM    Final         Scheduled Meds:  aspirin  EC  81 mg Oral Daily   atenolol   100 mg Oral Daily   famotidine  20 mg Oral BID   heparin   5,000 Units Subcutaneous Q8H   insulin  aspart  0-15 Units Subcutaneous TID WC   insulin  aspart  0-5 Units Subcutaneous QHS   levothyroxine   25 mcg Oral Q0600   loratadine  10 mg Oral  Daily   losartan   25 mg Oral Daily   pantoprazole   40 mg Oral Daily   sodium chloride  flush  3 mL Intravenous Q12H   traZODone   50 mg Oral QHS   Continuous Infusions:     LOS: 0 days     Calvin KATHEE Robson, MD [facility]   If 7PM-7AM, please contact night-coverage  07/17/2023, 11:00 AM

## 2023-07-17 NOTE — Plan of Care (Signed)
  Problem: Education: Goal: Ability to describe self-care measures that may prevent or decrease complications (Diabetes Survival Skills Education) will improve Outcome: Progressing Goal: Individualized Educational Video(s) Outcome: Progressing   Problem: Coping: Goal: Ability to adjust to condition or change in health will improve Outcome: Progressing   Problem: Fluid Volume: Goal: Ability to maintain a balanced intake and output will improve Outcome: Progressing   Problem: Health Behavior/Discharge Planning: Goal: Ability to identify and utilize available resources and services will improve Outcome: Progressing Goal: Ability to manage health-related needs will improve Outcome: Progressing   Problem: Metabolic: Goal: Ability to maintain appropriate glucose levels will improve Outcome: Progressing   Problem: Nutritional: Goal: Maintenance of adequate nutrition will improve Outcome: Progressing Goal: Progress toward achieving an optimal weight will improve Outcome: Progressing   Problem: Skin Integrity: Goal: Risk for impaired skin integrity will decrease Outcome: Progressing   Problem: Tissue Perfusion: Goal: Adequacy of tissue perfusion will improve Outcome: Progressing   Problem: Education: Goal: Knowledge of condition and prescribed therapy will improve Outcome: Progressing   Problem: Cardiac: Goal: Will achieve and/or maintain adequate cardiac output Outcome: Progressing   Problem: Physical Regulation: Goal: Complications related to the disease process, condition or treatment will be avoided or minimized Outcome: Progressing   Problem: Education: Goal: Knowledge of General Education information will improve Description: Including pain rating scale, medication(s)/side effects and non-pharmacologic comfort measures Outcome: Progressing   Problem: Health Behavior/Discharge Planning: Goal: Ability to manage health-related needs will improve Outcome:  Progressing   Problem: Clinical Measurements: Goal: Ability to maintain clinical measurements within normal limits will improve Outcome: Progressing Goal: Will remain free from infection Outcome: Progressing Goal: Diagnostic test results will improve Outcome: Progressing Goal: Respiratory complications will improve Outcome: Progressing Goal: Cardiovascular complication will be avoided Outcome: Progressing   Problem: Activity: Goal: Risk for activity intolerance will decrease Outcome: Progressing   Problem: Nutrition: Goal: Adequate nutrition will be maintained Outcome: Progressing   Problem: Coping: Goal: Level of anxiety will decrease Outcome: Progressing   Problem: Elimination: Goal: Will not experience complications related to bowel motility Outcome: Progressing Goal: Will not experience complications related to urinary retention Outcome: Progressing   Problem: Pain Managment: Goal: General experience of comfort will improve and/or be controlled Outcome: Progressing   Problem: Safety: Goal: Ability to remain free from injury will improve Outcome: Progressing   Problem: Skin Integrity: Goal: Risk for impaired skin integrity will decrease Outcome: Progressing

## 2023-07-18 DIAGNOSIS — R55 Syncope and collapse: Secondary | ICD-10-CM | POA: Diagnosis not present

## 2023-07-18 LAB — GLUCOSE, CAPILLARY
Glucose-Capillary: 152 mg/dL — ABNORMAL HIGH (ref 70–99)
Glucose-Capillary: 176 mg/dL — ABNORMAL HIGH (ref 70–99)
Glucose-Capillary: 171 mg/dL — ABNORMAL HIGH (ref 70–99)
Glucose-Capillary: 130 mg/dL — ABNORMAL HIGH (ref 70–99)

## 2023-07-18 MED ORDER — OXYCODONE HCL 5 MG PO TABS
5.0000 mg | ORAL_TABLET | ORAL | Status: DC | PRN
  Administered 2023-07-18: 5 mg via ORAL
  Administered 2023-07-18 – 2023-07-24 (×13): 10 mg via ORAL
  Administered 2023-07-25 – 2023-07-26 (×5): 5 mg via ORAL
  Administered 2023-07-27: 10 mg via ORAL
  Filled 2023-07-18 (×6): qty 2
  Filled 2023-07-18: qty 1
  Filled 2023-07-18 (×3): qty 2
  Filled 2023-07-18 (×3): qty 1
  Filled 2023-07-18 (×5): qty 2
  Filled 2023-07-18: qty 1
  Filled 2023-07-18: qty 2

## 2023-07-18 NOTE — Plan of Care (Signed)
  Problem: Education: Goal: Ability to describe self-care measures that may prevent or decrease complications (Diabetes Survival Skills Education) will improve Outcome: Progressing Goal: Individualized Educational Video(s) Outcome: Progressing   Problem: Coping: Goal: Ability to adjust to condition or change in health will improve Outcome: Progressing   Problem: Fluid Volume: Goal: Ability to maintain a balanced intake and output will improve Outcome: Progressing   Problem: Health Behavior/Discharge Planning: Goal: Ability to identify and utilize available resources and services will improve Outcome: Progressing Goal: Ability to manage health-related needs will improve Outcome: Progressing   Problem: Metabolic: Goal: Ability to maintain appropriate glucose levels will improve Outcome: Progressing   Problem: Nutritional: Goal: Maintenance of adequate nutrition will improve Outcome: Progressing Goal: Progress toward achieving an optimal weight will improve Outcome: Progressing   Problem: Skin Integrity: Goal: Risk for impaired skin integrity will decrease Outcome: Progressing   Problem: Tissue Perfusion: Goal: Adequacy of tissue perfusion will improve Outcome: Progressing   Problem: Education: Goal: Knowledge of condition and prescribed therapy will improve Outcome: Progressing   Problem: Cardiac: Goal: Will achieve and/or maintain adequate cardiac output Outcome: Progressing   Problem: Physical Regulation: Goal: Complications related to the disease process, condition or treatment will be avoided or minimized Outcome: Progressing   Problem: Education: Goal: Knowledge of General Education information will improve Description: Including pain rating scale, medication(s)/side effects and non-pharmacologic comfort measures Outcome: Progressing   Problem: Health Behavior/Discharge Planning: Goal: Ability to manage health-related needs will improve Outcome:  Progressing   Problem: Clinical Measurements: Goal: Ability to maintain clinical measurements within normal limits will improve Outcome: Progressing Goal: Will remain free from infection Outcome: Progressing Goal: Diagnostic test results will improve Outcome: Progressing Goal: Respiratory complications will improve Outcome: Progressing Goal: Cardiovascular complication will be avoided Outcome: Progressing   Problem: Activity: Goal: Risk for activity intolerance will decrease Outcome: Progressing   Problem: Nutrition: Goal: Adequate nutrition will be maintained Outcome: Progressing   Problem: Coping: Goal: Level of anxiety will decrease Outcome: Progressing   Problem: Elimination: Goal: Will not experience complications related to bowel motility Outcome: Progressing Goal: Will not experience complications related to urinary retention Outcome: Progressing   Problem: Pain Managment: Goal: General experience of comfort will improve and/or be controlled Outcome: Progressing   Problem: Safety: Goal: Ability to remain free from injury will improve Outcome: Progressing   Problem: Skin Integrity: Goal: Risk for impaired skin integrity will decrease Outcome: Progressing

## 2023-07-18 NOTE — Progress Notes (Signed)
 PROGRESS NOTE    Shannon Flores  FMW:[id number] DOB: 16-Dec-1950 DOA: 07/14/2023 PCP: [name], MD    Brief Narrative:  73 year old female with history of GERD, hypothyroid, non-insulin -dependent diabetes mellitus, fibromyalgia, asthma, hypertension, who presents ED for chief concerns of a fall with multiple sites of bruising and syncope.   Patient reports that she was having some diarrhea and noted that she is on antibiotic so she needs to take her antibiotic with food.  At 3 AM this morning she remembered that she did not eat yet and that since she has been on antibiotic, she should get up and eat something.  She then made herself a peanut butter sandwich.  As she is eating, she felt the food getting stuck in her throat and she could not swallow and could not breathe.  She could not find her phone.  So she intended to run outside the door to come to her neighbors house for help.  In the next thing she remembers she was on the floor and felt bruised all over.  She then noted that there was the food bolus is laying next to her so she must have spit it up or vomited up the food.  She then called EMS.   Patient has bruising and tenderness all over. Patient is able to flex and extend into a gripping motion with her left hand at bedside.  Assessment & Plan:   Principal Problem:   Syncope Active Problems:   History of asthma   History of anxiety   Ecchymosis of left eye   Ecchymosis of forearm   Ecchymosis of wrist   Traumatic ecchymosis of face   Traumatic ecchymosis of hand, left, initial encounter   Traumatic ecchymosis of hand, right, initial encounter   Chronic pain syndrome   Hypothyroidism   Uncontrolled type 2 diabetes mellitus with hyperglycemia, without long-term current use of insulin  (HCC)   Recurrent major depressive disorder, in partial remission (HCC)   Prolonged QT syndrome   UTI (urinary tract infection)  Syncope Suspected postconcussion syndrome Broad  differential at this point.  Possibly hypoxia in the setting of asphyxiation from fluid bolus.  Patient is on room air and tolerating p.o. however remains significantly ataxic with some element of volume depletion. Has been working with therapy however remains ataxic with only 25 feet of independent walking and associated dizziness and limitation by pain.  I also suspect possible mild concussion not visible on imaging that could be underlying some of the unsteadiness.  2D echocardiogram is reassuring.  Plan: Patient will benefit from placement in skilled nursing facility as next level of care. Continue fall precautions and therapy evaluations Regular orthostatics TOC consulted for skilled nursing facility placement Medically stable for discharge at this time  History of anxiety Continue Home alprazolam    History of asthma No evidence of exacerbation.  Continue as needed nebulizer   Traumatic ecchymosis of hand, right, initial encounter Continue with sling in place   Traumatic ecchymosis of face Present on admission, supportive care   Recurrent major depressive disorder, in partial remission (HCC) Trazodone  50 mg nightly resumed   Uncontrolled type 2 diabetes mellitus with hyperglycemia, without long-term current use of insulin  (HCC) Oral agents.  SSI with bedtime coverage   Hypothyroidism Continue levothyroxine  25 mcg daily    Chronic pain syndrome PDMP reviewed Alprazolam  as above   UTI (urinary tract infection) Present on admission Home nitrofurantoin , 100 mg p.o. twice daily resumed on admission to complete course  Prolonged QT syndrome Monitor serum magnesium    DVT prophylaxis: SQ heparin  Code Status: Full Family Communication: None Disposition Plan: Status is: Observation The patient will require care spanning > 2 midnights and should be moved to inpatient because: Ataxic gait.  Suspected postconcussive syndrome.  Unsafe discharge plan.  Will need skilled nurse  facility placement..   Level of care: Telemetry Cardiac  Consultants:  None  Procedures:  None  Antimicrobials: None   Subjective: Seen and examined.  Resting in bed.  Remains sore  Objective: Vitals:   07/17/23 2341 07/18/23 0248 07/18/23 0308 07/18/23 0822  BP: 126/73 124/87  129/75  Pulse: 76 75  75  Resp: 18 18  18   Temp: 98.1 F (36.7 C) (!) 97.5 F (36.4 C)  98.2 F (36.8 C)  TempSrc:      SpO2: 92% 93%  95%  Weight:   84.5 kg   Height:        Intake/Output Summary (Last 24 hours) at 07/18/2023 1158 Last data filed at 07/18/2023 0900 Gross per 24 hour  Intake 120 ml  Output --  Net 120 ml   Filed Weights   07/16/23 0500 07/17/23 0500 07/18/23 0308  Weight: 87.9 kg 92.7 kg 84.5 kg    Examination:  General exam: NAD.  Appears fatigued Respiratory system: Lungs clear.  Normal work of breathing.  Room air Cardiovascular system: S1-S2, RRR, no murmurs, no pedal edema Gastrointestinal system: Soft, NT/ND, normal bowel sounds Central nervous system: Alert and oriented. No focal neurological deficits. Extremities: Symmetric 5 x 5 power. Skin: Scattered ecchymoses on face and torso Psychiatry: Judgement and insight appear normal. Mood & affect appropriate.     Data Reviewed: I have personally reviewed following labs and imaging studies  CBC: Recent Labs  Lab 07/14/23 1335 07/15/23 0422 07/16/23 0838  WBC 6.9 6.9 7.5  NEUTROABS  --   --  3.7  HGB 14.9 13.9 13.8  HCT 44.1 40.1 40.3  MCV 89.5 89.5 88.8  PLT 172 143* 145*   Basic Metabolic Panel: Recent Labs  Lab 07/14/23 1335 07/14/23 2023 07/15/23 0422 07/16/23 0838  NA 136  --  138 139  K 3.5  --  2.9* 3.3*  CL 105  --  106 108  CO2 19*  --  25 24  GLUCOSE 174*  --  125* 116*  BUN 16  --  16 19  CREATININE 1.00  --  0.92 0.91  CALCIUM 8.9  --  8.6* 8.6*  MG  --  1.9  --  1.7   GFR: Estimated Creatinine Clearance: 59.4 mL/min (by C-G formula based on SCr of 0.91 mg/dL). Liver  Function Tests: No results for input(s): AST, ALT, ALKPHOS, BILITOT, PROT, ALBUMIN in the last 168 hours. No results for input(s): LIPASE, AMYLASE in the last 168 hours. No results for input(s): AMMONIA in the last 168 hours. Coagulation Profile: No results for input(s): INR, PROTIME in the last 168 hours. Cardiac Enzymes: No results for input(s): CKTOTAL, CKMB, CKMBINDEX, TROPONINI in the last 168 hours. BNP (last 3 results) No results for input(s): PROBNP in the last 8760 hours. HbA1C: No results for input(s): HGBA1C in the last 72 hours.  CBG: Recent Labs  Lab 07/17/23 0759 07/17/23 1203 07/17/23 1612 07/17/23 2045 07/18/23 0820  GLUCAP 151* 169* 120* 147* 152*   Lipid Profile: No results for input(s): CHOL, HDL, LDLCALC, TRIG, CHOLHDL, LDLDIRECT in the last 72 hours. Thyroid Function Tests: No results for input(s): TSH, T4TOTAL, FREET4, T3FREE,  THYROIDAB in the last 72 hours. Anemia Panel: No results for input(s): VITAMINB12, FOLATE, FERRITIN, TIBC, IRON, RETICCTPCT in the last 72 hours. Sepsis Labs: No results for input(s): PROCALCITON, LATICACIDVEN in the last 168 hours.  No results found for this or any previous visit (from the past 240 hours).       Radiology Studies: No results found.       Scheduled Meds:  aspirin  EC  81 mg Oral Daily   atenolol   100 mg Oral Daily   famotidine  20 mg Oral BID   heparin   5,000 Units Subcutaneous Q8H   insulin  aspart  0-15 Units Subcutaneous TID WC   insulin  aspart  0-5 Units Subcutaneous QHS   levothyroxine   25 mcg Oral Q0600   loratadine  10 mg Oral Daily   losartan   25 mg Oral Daily   pantoprazole   40 mg Oral Daily   sodium chloride  flush  3 mL Intravenous Q12H   traZODone   50 mg Oral QHS   Continuous Infusions:     LOS: 0 days     Calvin KATHEE Robson, MD [facility]   If 7PM-7AM, please contact night-coverage  07/18/2023,  11:58 AM

## 2023-07-18 NOTE — TOC Progression Note (Signed)
 Transition of Care [facility]) - Progression Note    Patient Details  Name: Shannon Flores MRN: [id number] Date of Birth: 12/30/1950  Transition of Care [facility]) CM/SW Contact  Delphine KANDICE Bring, RN Phone Number: 07/18/2023, 4:48 PM  Clinical Narrative:    CM spoke with patient and daughter Shannon Flores in her room this morning. CM gave them the facilities that had accepted her. Her first choice was [facility] which denied because of insurance. Patient states that she has been at [facility] [place] in past but they have not responded. CM called but no answer.   She does not want [facility] or [facility].    At this time they have not provided a choice       Expected Discharge Plan and Services                                               Social Determinants of Health (SDOH) Interventions SDOH Screenings   Food Insecurity: No Food Insecurity (07/14/2023)  Recent Concern: Food Insecurity - Food Insecurity Present (04/29/2023)   Received from [facility]  Housing: Low Risk  (07/14/2023)  Transportation Needs: Unmet Transportation Needs (07/14/2023)  Utilities: Not At Risk (07/14/2023)  Financial Resource Strain: Medium Risk (04/29/2023)   Received from [facility]  Physical Activity: Inactive (04/29/2023)   Received from [facility]  Social Connections: Moderately Isolated (07/14/2023)  Stress: Stress Concern Present (04/29/2023)   Received from [facility]  Tobacco Use: Low Risk  (07/14/2023)  Health Literacy: Adequate Health Literacy (04/29/2023)   Received from [facility]    Readmission Risk Interventions     No data to display

## 2023-07-19 DIAGNOSIS — R55 Syncope and collapse: Secondary | ICD-10-CM | POA: Diagnosis not present

## 2023-07-19 LAB — GLUCOSE, CAPILLARY
Glucose-Capillary: 107 mg/dL — ABNORMAL HIGH (ref 70–99)
Glucose-Capillary: 210 mg/dL — ABNORMAL HIGH (ref 70–99)

## 2023-07-19 MED ORDER — ALBUTEROL SULFATE HFA 108 (90 BASE) MCG/ACT IN AERS: 2.0000 | INHALATION_SPRAY | RESPIRATORY_TRACT | Status: DC | PRN

## 2023-07-19 MED ORDER — ALBUTEROL SULFATE (2.5 MG/3ML) 0.083% IN NEBU
2.5000 mg | INHALATION_SOLUTION | RESPIRATORY_TRACT | Status: DC | PRN
  Administered 2023-07-19 – 2023-07-20 (×2): 2.5 mg via RESPIRATORY_TRACT
  Filled 2023-07-19 (×2): qty 3

## 2023-07-19 NOTE — Progress Notes (Signed)
  Patient decided she did not want [facility] and prefer to review bed offer list again. Daughter has previous list. CM will provide patient with list below.  [facility]  Accepted -- [address] [place] [address] [phone]    [facility] Preferred SNF  Accepted -- [address] [place] [address] [phone]    [facility]  Accepted -- [address], [place] [address] [phone]    [facility] Preferred SNF  Accepted -- [address] [place] [address] [phone]    [facility]  Accepted -- [address] [place] [address] [phone]    [facility] Preferred SNF  Accepted -- [address], [place] [address] [phone]    [facility] Preferred SNF  Accepted -- [address], [place] [address] [phone]    [facility] [place] Preferred SNF  Accepted -- [address] [place] [address] [phone]

## 2023-07-19 NOTE — Progress Notes (Signed)
 PROGRESS NOTE    Shannon Flores  FMW:[id number] DOB: 1950-12-21 DOA: 07/14/2023 PCP: [name], MD    Brief Narrative:  73 year old female with history of GERD, hypothyroid, non-insulin -dependent diabetes mellitus, fibromyalgia, asthma, hypertension, who presents ED for chief concerns of a fall with multiple sites of bruising and syncope.   Patient reports that she was having some diarrhea and noted that she is on antibiotic so she needs to take her antibiotic with food.  At 3 AM this morning she remembered that she did not eat yet and that since she has been on antibiotic, she should get up and eat something.  She then made herself a peanut butter sandwich.  As she is eating, she felt the food getting stuck in her throat and she could not swallow and could not breathe.  She could not find her phone.  So she intended to run outside the door to come to her neighbors house for help.  In the next thing she remembers she was on the floor and felt bruised all over.  She then noted that there was the food bolus is laying next to her so she must have spit it up or vomited up the food.  She then called EMS.   Patient has bruising and tenderness all over. Patient is able to flex and extend into a gripping motion with her left hand at bedside.  Assessment & Plan:   Principal Problem:   Syncope Active Problems:   History of asthma   History of anxiety   Ecchymosis of left eye   Ecchymosis of forearm   Ecchymosis of wrist   Traumatic ecchymosis of face   Traumatic ecchymosis of hand, left, initial encounter   Traumatic ecchymosis of hand, right, initial encounter   Chronic pain syndrome   Hypothyroidism   Uncontrolled type 2 diabetes mellitus with hyperglycemia, without long-term current use of insulin  (HCC)   Recurrent major depressive disorder, in partial remission (HCC)   Prolonged QT syndrome   UTI (urinary tract infection)  Syncope Suspected postconcussion syndrome Broad  differential at this point.  Possibly hypoxia in the setting of asphyxiation from fluid bolus.  Patient is on room air and tolerating p.o. however remains significantly ataxic with some element of volume depletion. Has been working with therapy however remains ataxic with only 25 feet of independent walking and associated dizziness and limitation by pain.  I also suspect possible mild concussion not visible on imaging that could be underlying some of the unsteadiness.  2D echocardiogram is reassuring.  Plan: Patient will benefit from placement in skilled nursing facility as next level of care. Continue fall precautions and therapy evaluations Regular orthostatics TOC consulted for skilled nursing facility placement Remains medically ready for discharge  History of anxiety Continue Home alprazolam    History of asthma No evidence of exacerbation.  Continue as needed nebulizer   Traumatic ecchymosis of hand, right, initial encounter Continue with sling in place   Traumatic ecchymosis of face Present on admission, supportive care   Recurrent major depressive disorder, in partial remission (HCC) Trazodone  50 mg nightly resumed   Uncontrolled type 2 diabetes mellitus with hyperglycemia, without long-term current use of insulin  (HCC) Oral agents.  SSI with bedtime coverage   Hypothyroidism Continue levothyroxine  25 mcg daily    Chronic pain syndrome PDMP reviewed Alprazolam  as above   UTI (urinary tract infection) Present on admission Home nitrofurantoin , 100 mg p.o. twice daily resumed on admission to complete course   Prolonged  QT syndrome Monitor serum magnesium    DVT prophylaxis: SQ heparin  Code Status: Full Family Communication: None Disposition Plan: Status is: Observation The patient will require care spanning > 2 midnights and should be moved to inpatient because: Ataxic gait.  Suspected postconcussive syndrome.  Unsafe discharge plan.  Will need skilled nurse  facility placement..   Level of care: Telemetry Cardiac  Consultants:  None  Procedures:  None  Antimicrobials: None   Subjective: Seen and examined.  Resting in bed.  No acute events overnight  Objective: Vitals:   07/19/23 0417 07/19/23 0455 07/19/23 0734 07/19/23 1132  BP: (!) 122/46  123/63 (!) 112/50  Pulse: 70  77 78  Resp: 18  17 20   Temp: 98.3 F (36.8 C)  97.6 F (36.4 C) 98.6 F (37 C)  TempSrc: Oral     SpO2: 98%  100% 99%  Weight:  85.2 kg    Height:        Intake/Output Summary (Last 24 hours) at 07/19/2023 1414 Last data filed at 07/19/2023 0900 Gross per 24 hour  Intake 360 ml  Output --  Net 360 ml   Filed Weights   07/17/23 0500 07/18/23 0308 07/19/23 0455  Weight: 92.7 kg 84.5 kg 85.2 kg    Examination:  General exam: No acute distress.  Appears fatigued Respiratory system: Lungs clear.  Normal work of breathing.  Room air Cardiovascular system: S1-S2, RRR, no murmurs, no pedal edema Gastrointestinal system: Soft, NT/ND, normal bowel sounds Central nervous system: Alert and oriented. No focal neurological deficits. Extremities: Symmetric 5 x 5 power. Skin: Scattered ecchymoses on face and torso Psychiatry: Judgement and insight appear normal. Mood & affect appropriate.     Data Reviewed: I have personally reviewed following labs and imaging studies  CBC: Recent Labs  Lab 07/14/23 1335 07/15/23 0422 07/16/23 0838  WBC 6.9 6.9 7.5  NEUTROABS  --   --  3.7  HGB 14.9 13.9 13.8  HCT 44.1 40.1 40.3  MCV 89.5 89.5 88.8  PLT 172 143* 145*   Basic Metabolic Panel: Recent Labs  Lab 07/14/23 1335 07/14/23 2023 07/15/23 0422 07/16/23 0838  NA 136  --  138 139  K 3.5  --  2.9* 3.3*  CL 105  --  106 108  CO2 19*  --  25 24  GLUCOSE 174*  --  125* 116*  BUN 16  --  16 19  CREATININE 1.00  --  0.92 0.91  CALCIUM 8.9  --  8.6* 8.6*  MG  --  1.9  --  1.7   GFR: Estimated Creatinine Clearance: 59.7 mL/min (by C-G formula based  on SCr of 0.91 mg/dL). Liver Function Tests: No results for input(s): AST, ALT, ALKPHOS, BILITOT, PROT, ALBUMIN in the last 168 hours. No results for input(s): LIPASE, AMYLASE in the last 168 hours. No results for input(s): AMMONIA in the last 168 hours. Coagulation Profile: No results for input(s): INR, PROTIME in the last 168 hours. Cardiac Enzymes: No results for input(s): CKTOTAL, CKMB, CKMBINDEX, TROPONINI in the last 168 hours. BNP (last 3 results) No results for input(s): PROBNP in the last 8760 hours. HbA1C: No results for input(s): HGBA1C in the last 72 hours.  CBG: Recent Labs  Lab 07/18/23 1214 07/18/23 1611 07/18/23 2116 07/19/23 0735 07/19/23 1132  GLUCAP 176* 171* 130* 107* 210*   Lipid Profile: No results for input(s): CHOL, HDL, LDLCALC, TRIG, CHOLHDL, LDLDIRECT in the last 72 hours. Thyroid Function Tests: No results for input(s):  TSH, T4TOTAL, FREET4, T3FREE, THYROIDAB in the last 72 hours. Anemia Panel: No results for input(s): VITAMINB12, FOLATE, FERRITIN, TIBC, IRON, RETICCTPCT in the last 72 hours. Sepsis Labs: No results for input(s): PROCALCITON, LATICACIDVEN in the last 168 hours.  No results found for this or any previous visit (from the past 240 hours).       Radiology Studies: No results found.       Scheduled Meds:  aspirin  EC  81 mg Oral Daily   atenolol   100 mg Oral Daily   famotidine  20 mg Oral BID   heparin   5,000 Units Subcutaneous Q8H   levothyroxine   25 mcg Oral Q0600   loratadine  10 mg Oral Daily   losartan   25 mg Oral Daily   pantoprazole   40 mg Oral Daily   sodium chloride  flush  3 mL Intravenous Q12H   traZODone   50 mg Oral QHS   Continuous Infusions:     LOS: 0 days     Calvin KATHEE Robson, MD [facility]   If 7PM-7AM, please contact night-coverage  07/19/2023, 2:14 PM

## 2023-07-19 NOTE — Plan of Care (Addendum)
  Problem: Education: Goal: Ability to describe self-care measures that may prevent or decrease complications (Diabetes Survival Skills Education) will improve Outcome: Progressing Goal: Individualized Educational Video(s) Outcome: Progressing   Problem: Coping: Goal: Ability to adjust to condition or change in health will improve Outcome: Progressing   Problem: Fluid Volume: Goal: Ability to maintain a balanced intake and output will improve Outcome: Progressing   Problem: Health Behavior/Discharge Planning: Goal: Ability to identify and utilize available resources and services will improve Outcome: Progressing Goal: Ability to manage health-related needs will improve Outcome: Progressing   Problem: Metabolic: Goal: Ability to maintain appropriate glucose levels will improve Outcome: Progressing   Problem: Nutritional: Goal: Maintenance of adequate nutrition will improve Outcome: Progressing Goal: Progress toward achieving an optimal weight will improve Outcome: Progressing   Problem: Skin Integrity: Goal: Risk for impaired skin integrity will decrease Outcome: Progressing   Problem: Tissue Perfusion: Goal: Adequacy of tissue perfusion will improve Outcome: Progressing   Problem: Education: Goal: Knowledge of condition and prescribed therapy will improve Outcome: Progressing   Problem: Cardiac: Goal: Will achieve and/or maintain adequate cardiac output Outcome: Progressing   Problem: Physical Regulation: Goal: Complications related to the disease process, condition or treatment will be avoided or minimized Outcome: Progressing   Problem: Education: Goal: Knowledge of General Education information will improve Description: Including pain rating scale, medication(s)/side effects and non-pharmacologic comfort measures Outcome: Progressing   Problem: Health Behavior/Discharge Planning: Goal: Ability to manage health-related needs will improve Outcome:  Progressing   Problem: Clinical Measurements: Goal: Ability to maintain clinical measurements within normal limits will improve Outcome: Progressing Goal: Will remain free from infection Outcome: Progressing Goal: Diagnostic test results will improve Outcome: Progressing Goal: Respiratory complications will improve Outcome: Progressing Goal: Cardiovascular complication will be avoided Outcome: Progressing   Problem: Activity: Goal: Risk for activity intolerance will decrease Outcome: Progressing   Problem: Nutrition: Goal: Adequate nutrition will be maintained Outcome: Progressing   Problem: Coping: Goal: Level of anxiety will decrease Outcome: Progressing   Problem: Elimination: Goal: Will not experience complications related to bowel motility Outcome: Progressing Goal: Will not experience complications related to urinary retention Outcome: Progressing   Problem: Pain Managment: Goal: General experience of comfort will improve and/or be controlled Outcome: Progressing   Problem: Safety: Goal: Ability to remain free from injury will improve Outcome: Progressing   Problem: Skin Integrity: Goal: Risk for impaired skin integrity will decrease Outcome: Progressing

## 2023-07-20 DIAGNOSIS — R55 Syncope and collapse: Secondary | ICD-10-CM | POA: Diagnosis not present

## 2023-07-20 NOTE — Progress Notes (Signed)
 PROGRESS NOTE    Shannon Flores  FMW:[id number] DOB: 1951/01/12 DOA: 07/14/2023 PCP: [name], MD    Brief Narrative:   73 year old female with history of GERD, hypothyroid, non-insulin -dependent diabetes mellitus, fibromyalgia, asthma, hypertension, who presents ED for chief concerns of a fall with multiple sites of bruising and syncope.   Patient reports that she was having some diarrhea and noted that she is on antibiotic so she needs to take her antibiotic with food.  At 3 AM this morning she remembered that she did not eat yet and that since she has been on antibiotic, she should get up and eat something.  She then made herself a peanut butter sandwich.  As she is eating, she felt the food getting stuck in her throat and she could not swallow and could not breathe.  She could not find her phone.  So she intended to run outside the door to come to her neighbors house for help.  In the next thing she remembers she was on the floor and felt bruised all over.  She then noted that there was the food bolus is laying next to her so she must have spit it up or vomited up the food.  She then called EMS.   Patient has bruising and tenderness all over. Patient is able to flex and extend into a gripping motion with her left hand at bedside.  Assessment & Plan:   Principal Problem:   Syncope Active Problems:   History of asthma   History of anxiety   Ecchymosis of left eye   Ecchymosis of forearm   Ecchymosis of wrist   Traumatic ecchymosis of face   Traumatic ecchymosis of hand, left, initial encounter   Traumatic ecchymosis of hand, right, initial encounter   Chronic pain syndrome   Hypothyroidism   Uncontrolled type 2 diabetes mellitus with hyperglycemia, without long-term current use of insulin  (HCC)   Recurrent major depressive disorder, in partial remission (HCC)   Prolonged QT syndrome   UTI (urinary tract infection)  Syncope Suspected postconcussion syndrome Broad  differential at this point.  Possibly hypoxia in the setting of asphyxiation from fluid bolus.  Patient is on room air and tolerating p.o. however remains significantly ataxic with some element of volume depletion. Has been working with therapy however remains ataxic with only 25 feet of independent walking and associated dizziness and limitation by pain.  I also suspect possible mild concussion not visible on imaging that could be underlying some of the unsteadiness.  2D echocardiogram is reassuring.  Plan: Patient will benefit from placement in skilled nursing facility as next level of care. Continue fall precautions and therapy evaluations Regular orthostatics TOC consulted for skilled nursing facility placement Remains medically ready for discharge  History of anxiety Continue Home alprazolam    History of asthma No evidence of exacerbation.  Continue as needed nebulizer   Traumatic ecchymosis of hand, right, initial encounter Continue with sling in place   Traumatic ecchymosis of face Present on admission, supportive care   Recurrent major depressive disorder, in partial remission (HCC) Trazodone  50 mg nightly resumed   Uncontrolled type 2 diabetes mellitus with hyperglycemia, without long-term current use of insulin  (HCC) Oral agents.  SSI with bedtime coverage   Hypothyroidism Continue levothyroxine  25 mcg daily    Chronic pain syndrome PDMP reviewed Alprazolam  as above   UTI (urinary tract infection) Present on admission Home nitrofurantoin , 100 mg p.o. twice daily resumed on admission to complete course  Prolonged QT syndrome Monitor serum magnesium    DVT prophylaxis: SQ heparin  Code Status: Full Family Communication: None Disposition Plan: Status is: Observation The patient will require care spanning > 2 midnights and should be moved to inpatient because: Ataxic gait.  Suspected postconcussive syndrome.  Unsafe discharge plan.  Will need skilled nurse  facility placement..   Level of care: Telemetry Cardiac  Consultants:  None  Procedures:  None  Antimicrobials: None   Subjective: Seen and examined.  Resting in bed.  No acute events overnight.  Objective: Vitals:   07/20/23 0429 07/20/23 0500 07/20/23 0804 07/20/23 1217  BP: 111/64  93/62 101/75  Pulse: 72  79 77  Resp: 19     Temp: 97.7 F (36.5 C)  (!) 97.3 F (36.3 C) 98.3 F (36.8 C)  TempSrc: Oral     SpO2: 98%  100% 99%  Weight:  85 kg    Height:        Intake/Output Summary (Last 24 hours) at 07/20/2023 1322 Last data filed at 07/20/2023 0900 Gross per 24 hour  Intake 120 ml  Output --  Net 120 ml   Filed Weights   07/18/23 0308 07/19/23 0455 07/20/23 0500  Weight: 84.5 kg 85.2 kg 85 kg    Examination:  General exam: No acute distress.  Appears fatigued Respiratory system: Lungs clear.  No work of breathing.  Room air Cardiovascular system: S1-S2, RRR, no murmurs, no pedal edema Gastrointestinal system: Soft, NT/ND, normal bowel sounds Central nervous system: Alert and oriented. No focal neurological deficits. Extremities: Symmetric 5 x 5 power. Skin: Scattered ecchymoses on face and torso Psychiatry: Judgement and insight appear normal. Mood & affect appropriate.     Data Reviewed: I have personally reviewed following labs and imaging studies  CBC: Recent Labs  Lab 07/14/23 1335 07/15/23 0422 07/16/23 0838  WBC 6.9 6.9 7.5  NEUTROABS  --   --  3.7  HGB 14.9 13.9 13.8  HCT 44.1 40.1 40.3  MCV 89.5 89.5 88.8  PLT 172 143* 145*   Basic Metabolic Panel: Recent Labs  Lab 07/14/23 1335 07/14/23 2023 07/15/23 0422 07/16/23 0838  NA 136  --  138 139  K 3.5  --  2.9* 3.3*  CL 105  --  106 108  CO2 19*  --  25 24  GLUCOSE 174*  --  125* 116*  BUN 16  --  16 19  CREATININE 1.00  --  0.92 0.91  CALCIUM 8.9  --  8.6* 8.6*  MG  --  1.9  --  1.7   GFR: Estimated Creatinine Clearance: 59.6 mL/min (by C-G formula based on SCr of 0.91  mg/dL). Liver Function Tests: No results for input(s): AST, ALT, ALKPHOS, BILITOT, PROT, ALBUMIN in the last 168 hours. No results for input(s): LIPASE, AMYLASE in the last 168 hours. No results for input(s): AMMONIA in the last 168 hours. Coagulation Profile: No results for input(s): INR, PROTIME in the last 168 hours. Cardiac Enzymes: No results for input(s): CKTOTAL, CKMB, CKMBINDEX, TROPONINI in the last 168 hours. BNP (last 3 results) No results for input(s): PROBNP in the last 8760 hours. HbA1C: No results for input(s): HGBA1C in the last 72 hours.  CBG: Recent Labs  Lab 07/18/23 1214 07/18/23 1611 07/18/23 2116 07/19/23 0735 07/19/23 1132  GLUCAP 176* 171* 130* 107* 210*   Lipid Profile: No results for input(s): CHOL, HDL, LDLCALC, TRIG, CHOLHDL, LDLDIRECT in the last 72 hours. Thyroid Function Tests: No results for input(s):  TSH, T4TOTAL, FREET4, T3FREE, THYROIDAB in the last 72 hours. Anemia Panel: No results for input(s): VITAMINB12, FOLATE, FERRITIN, TIBC, IRON, RETICCTPCT in the last 72 hours. Sepsis Labs: No results for input(s): PROCALCITON, LATICACIDVEN in the last 168 hours.  No results found for this or any previous visit (from the past 240 hours).       Radiology Studies: No results found.       Scheduled Meds:  aspirin  EC  81 mg Oral Daily   atenolol   100 mg Oral Daily   famotidine  20 mg Oral BID   heparin   5,000 Units Subcutaneous Q8H   levothyroxine   25 mcg Oral Q0600   loratadine  10 mg Oral Daily   losartan   25 mg Oral Daily   pantoprazole   40 mg Oral Daily   sodium chloride  flush  3 mL Intravenous Q12H   traZODone   50 mg Oral QHS   Continuous Infusions:     LOS: 0 days     Calvin KATHEE Robson, MD [facility]   If 7PM-7AM, please contact night-coverage  07/20/2023, 1:22 PM

## 2023-07-20 NOTE — TOC Progression Note (Signed)
 Transition of Care [facility]) - Progression Note    Patient Details  Name: Shannon Flores MRN: [id number] Date of Birth: [birth year]  [facility] CM/SW Contact  Marinda Cooks, RN Phone Number: 07/20/2023, 4:31 PM  Clinical Narrative:    This CM called and spoke with pt regarding dc plan and bed offer . Pt confirmed that her 1st choice is [facility] in [place]. This CM called and spoke with Admission Liaison Kiya and confirmed bed offer when pt is medically cleared . TOC will cont to folloe DC planning / care coordination and update as applicable.         Expected Discharge Plan and Services                                               Social Determinants of Health (SDOH) Interventions SDOH Screenings   Food Insecurity: No Food Insecurity (07/14/2023)  Recent Concern: Food Insecurity - Food Insecurity Present (04/29/2023)   Received from [facility]  Housing: Low Risk  (07/14/2023)  Transportation Needs: Unmet Transportation Needs (07/14/2023)  Utilities: Not At Risk (07/14/2023)  Financial Resource Strain: Medium Risk (04/29/2023)   Received from [facility]  Physical Activity: Inactive (04/29/2023)   Received from [facility]  Social Connections: Moderately Isolated (07/14/2023)  Stress: Stress Concern Present (04/29/2023)   Received from [facility]  Tobacco Use: Low Risk  (07/14/2023)  Health Literacy: Adequate Health Literacy (04/29/2023)   Received from [facility]    Readmission Risk Interventions     No data to display

## 2023-07-20 NOTE — Progress Notes (Signed)
 Physical Therapy Treatment Patient Details Name: Shannon Flores MRN: [id number] DOB: February 01, 1950 Today's Date: 07/20/2023   History of Present Illness 73 year old female with history of GERD, hypothyroid, non-insulin -dependent diabetes mellitus, fibromyalgia, asthma, hypertension, who presents ED for chief concerns of a fall with multiple sites of bruising and syncope    PT Comments  Today's tx session involved continuation of ambulation to improve activity tolerance. Encouragement needed to participate in therapy this date as pt's R wrist in pain, SPT notified RN. Pt was assisted with toileting this date with RW, no LOB experienced but pt stating she felt dizzy. Ambulation distance limited this date by R wrist pain, limiting pt's RW grasp, no LOB. Pt continues to require multiple re-directions, verbal and tactile cues to complete mobility along with manipulation of DME, tasks requiring multiple cues as pt becomes easily distracted. Pt will continue to benefit from skilled PT interventions to meet therapy goals and improve QoL.    If plan is discharge home, recommend the following: A little help with walking and/or transfers;A little help with bathing/dressing/bathroom;Assist for transportation;Help with stairs or ramp for entrance;Assistance with cooking/housework   Can travel by private vehicle     Yes  Equipment Recommendations  None recommended by PT    Recommendations for Other Services       Precautions / Restrictions Precautions Precautions: Fall Recall of Precautions/Restrictions: Intact Restrictions Weight Bearing Restrictions Per Provider Order: No     Mobility  Bed Mobility Overal bed mobility: Needs Assistance Bed Mobility: Supine to Sit     Supine to sit: Supervision     General bed mobility comments: increased time to complete movement but no verbal cues needed to complete    Transfers Overall transfer level: Needs assistance Equipment used: Rolling walker  (2 wheels) Transfers: Sit to/from Stand Sit to Stand: Contact guard assist           General transfer comment: increased time to complete movement d/t R wrist pain, no LOB experienced    Ambulation/Gait Ambulation/Gait assistance: Contact guard assist Gait Distance (Feet): 70 Feet Assistive device: Rolling walker (2 wheels) Gait Pattern/deviations: Step-through pattern, Decreased step length - right, Decreased step length - left       General Gait Details: x3 standing rest breaks, pt stating she felt dizzy at times but no LOB experienced, multuple cues on manipulation of RW   Stairs             Wheelchair Mobility     Tilt Bed    Modified Rankin (Stroke Patients Only)       Balance Overall balance assessment: Needs assistance Sitting-balance support: Feet supported Sitting balance-Leahy Scale: Good Sitting balance - Comments: sitting EOB, no LOB   Standing balance support: Bilateral upper extremity supported Standing balance-Leahy Scale: Fair Standing balance comment: standing with RW, no LOB, pt stating she felt dizzy at times                            Communication Communication Communication: No apparent difficulties  Cognition Arousal: Alert Behavior During Therapy: WFL for tasks assessed/performed   PT - Cognitive impairments: No apparent impairments                         Following commands: Intact      Cueing Cueing Techniques: Verbal cues, Tactile cues  Exercises Other Exercises Other Exercises: Assisted pt with toileting needs, CGA with RW  General Comments        Pertinent Vitals/Pain Pain Assessment Pain Assessment: Faces Faces Pain Scale: Hurts even more Pain Location: R wrist Pain Descriptors / Indicators: Discomfort, Aching Pain Intervention(s): Monitored during session, Limited activity within patient's tolerance, Patient requesting pain meds-RN notified    Home Living                           Prior Function            PT Goals (current goals can now be found in the care plan section) Acute Rehab PT Goals Patient Stated Goal: to go home when she feels better PT Goal Formulation: With patient Time For Goal Achievement: 07/29/23 Potential to Achieve Goals: Good Progress towards PT goals: Progressing toward goals    Frequency    Min 2X/week      PT Plan      Co-evaluation              AM-PAC PT 6 Clicks Mobility   Outcome Measure  Help needed turning from your back to your side while in a flat bed without using bedrails?: A Little Help needed moving from lying on your back to sitting on the side of a flat bed without using bedrails?: A Little Help needed moving to and from a bed to a chair (including a wheelchair)?: A Little Help needed standing up from a chair using your arms (e.g., wheelchair or bedside chair)?: A Lot Help needed to walk in hospital room?: A Lot Help needed climbing 3-5 steps with a railing? : A Lot 6 Click Score: 15    End of Session Equipment Utilized During Treatment: Gait belt Activity Tolerance: Patient tolerated treatment well Patient left: with call bell/phone within reach;with chair alarm set;in chair Nurse Communication: Mobility status;Patient requests pain meds PT Visit Diagnosis: Other abnormalities of gait and mobility (R26.89);Difficulty in walking, not elsewhere classified (R26.2);Muscle weakness (generalized) (M62.81)     Time: 9043-8975 PT Time Calculation (min) (ACUTE ONLY): 28 min  Charges:    $Gait Training: 8-22 mins $Therapeutic Activity: 8-22 mins PT General Charges $$ ACUTE PT VISIT: 1 Visit                        Herman Mell Romero-Perozo, SPT  07/20/2023, 12:10 PM

## 2023-07-20 NOTE — Plan of Care (Signed)
  Problem: Coping: Goal: Ability to adjust to condition or change in health will improve Outcome: Progressing   Problem: Fluid Volume: Goal: Ability to maintain a balanced intake and output will improve Outcome: Progressing   Problem: Nutritional: Goal: Maintenance of adequate nutrition will improve Outcome: Progressing   Problem: Skin Integrity: Goal: Risk for impaired skin integrity will decrease Outcome: Progressing   Problem: Clinical Measurements: Goal: Ability to maintain clinical measurements within normal limits will improve Outcome: Progressing   Problem: Nutrition: Goal: Adequate nutrition will be maintained Outcome: Progressing   Problem: Pain Managment: Goal: General experience of comfort will improve and/or be controlled Outcome: Progressing   Problem: Safety: Goal: Ability to remain free from injury will improve Outcome: Progressing

## 2023-07-20 NOTE — Progress Notes (Signed)
 Occupational Therapy Treatment Patient Details Name: Shannon Flores MRN: [id number] DOB: 1950-03-07 Today's Date: 07/20/2023   History of present illness 73 year old female with history of GERD, hypothyroid, non-insulin -dependent diabetes mellitus, fibromyalgia, asthma, hypertension, who presents ED for chief concerns of a fall with multiple sites of bruising and syncope   OT comments  Pt seen for OT Tx. Pt easily distracted requiring intermittent VC to redirect to EOB grooming and UB/LB bathing tasks. Pt endorses slight improvement in R wrist comfort. Pt required supv for bed mobility, set up and supv for safety to complete EOB ADL. Pt declined OOB activity 2/2 fatigue. Able to complete lateral scoots wihtout direct assist. Pt progressing, continues to benefit.       If plan is discharge home, recommend the following:  A little help with walking and/or transfers;A little help with bathing/dressing/bathroom;Assistance with cooking/housework;Assist for transportation;Help with stairs or ramp for entrance   Equipment Recommendations  BSC/3in1;Other (comment) (RW)    Recommendations for Other Services      Precautions / Restrictions Precautions Precautions: Fall Recall of Precautions/Restrictions: Intact Restrictions Weight Bearing Restrictions Per Provider Order: No       Mobility Bed Mobility Overal bed mobility: Needs Assistance Bed Mobility: Supine to Sit, Sit to Supine     Supine to sit: Supervision Sit to supine: Supervision        Transfers                         Balance Overall balance assessment: Needs assistance Sitting-balance support: Feet supported Sitting balance-Leahy Scale: Good Sitting balance - Comments: sitting EOB, no LOB                                   ADL either performed or assessed with clinical judgement   ADL Overall ADL's : Needs assistance/impaired     Grooming: Wash/dry hands;Wash/dry face;Oral  care;Sitting Grooming Details (indicate cue type and reason): set up and VC to initiate, VC to redirect to task as easily distracted, difficulty to take toothpaste cap off Upper Body Bathing: Sitting;Supervision/ safety;Set up   Lower Body Bathing: Sitting/lateral leans;Set up;Supervison/ safety                              Extremity/Trunk Assessment              Vision       Perception     Praxis     Communication Communication Communication: Impaired Factors Affecting Communication: Hearing impaired   Cognition Arousal: Alert Behavior During Therapy: WFL for tasks assessed/performed Cognition: No family/caregiver present to determine baseline             OT - Cognition Comments: questionable safety awareness, easily distracted requiring VC to redirect                 Following commands: Intact        Cueing   Cueing Techniques: Verbal cues, Tactile cues  Exercises      Shoulder Instructions       General Comments      Pertinent Vitals/ Pain       Pain Assessment Pain Assessment: Faces Faces Pain Scale: Hurts little more Pain Location: R wrist Pain Descriptors / Indicators: Discomfort, Aching, Guarding Pain Intervention(s): Limited activity within patient's tolerance, Monitored during session, Premedicated before session, Repositioned  Home Living                                          Prior Functioning/Environment              Frequency  Min 2X/week        Progress Toward Goals  OT Goals(current goals can now be found in the care plan section)  Progress towards OT goals: Progressing toward goals  Acute Rehab OT Goals Patient Stated Goal: feel better OT Goal Formulation: With patient Time For Goal Achievement: 07/29/23 Potential to Achieve Goals: Fair  Plan      Co-evaluation                 AM-PAC OT 6 Clicks Daily Activity     Outcome Measure   Help from another person  eating meals?: None Help from another person taking care of personal grooming?: None Help from another person toileting, which includes using toliet, bedpan, or urinal?: A Little Help from another person bathing (including washing, rinsing, drying)?: A Little Help from another person to put on and taking off regular upper body clothing?: A Little Help from another person to put on and taking off regular lower body clothing?: A Little 6 Click Score: 20    End of Session    OT Visit Diagnosis: Unsteadiness on feet (R26.81);Repeated falls (R29.6);Muscle weakness (generalized) (M62.81)   Activity Tolerance Patient tolerated treatment well   Patient Left in bed;with call bell/phone within reach;with bed alarm set   Nurse Communication          Time: 8458-8392 OT Time Calculation (min): 26 min  Charges: OT General Charges $OT Visit: 1 Visit OT Treatments $Self Care/Home Management : 23-37 mins  Warren SAUNDERS., MPH, MS, OTR/L ascom [phone] 07/20/23, 4:10 PM

## 2023-07-21 DIAGNOSIS — R55 Syncope and collapse: Secondary | ICD-10-CM | POA: Diagnosis not present

## 2023-07-21 NOTE — Progress Notes (Signed)
 PROGRESS NOTE    Shannon Flores  FMW:[id number] DOB: 1950/06/04 DOA: 07/14/2023 PCP: [name], MD    Brief Narrative:   73 year old female with history of GERD, hypothyroid, non-insulin -dependent diabetes mellitus, fibromyalgia, asthma, hypertension, who presents ED for chief concerns of a fall with multiple sites of bruising and syncope.   Patient reports that she was having some diarrhea and noted that she is on antibiotic so she needs to take her antibiotic with food.  At 3 AM this morning she remembered that she did not eat yet and that since she has been on antibiotic, she should get up and eat something.  She then made herself a peanut butter sandwich.  As she is eating, she felt the food getting stuck in her throat and she could not swallow and could not breathe.  She could not find her phone.  So she intended to run outside the door to come to her neighbors house for help.  In the next thing she remembers she was on the floor and felt bruised all over.  She then noted that there was the food bolus is laying next to her so she must have spit it up or vomited up the food.  She then called EMS.   Patient has bruising and tenderness all over. Patient is able to flex and extend into a gripping motion with her left hand at bedside.  Assessment & Plan:   Principal Problem:   Syncope Active Problems:   History of asthma   History of anxiety   Ecchymosis of left eye   Ecchymosis of forearm   Ecchymosis of wrist   Traumatic ecchymosis of face   Traumatic ecchymosis of hand, left, initial encounter   Traumatic ecchymosis of hand, right, initial encounter   Chronic pain syndrome   Hypothyroidism   Uncontrolled type 2 diabetes mellitus with hyperglycemia, without long-term current use of insulin  (HCC)   Recurrent major depressive disorder, in partial remission (HCC)   Prolonged QT syndrome   UTI (urinary tract infection)  Syncope Suspected postconcussion syndrome  Broad  differential at this point.  Possibly hypoxia in the setting of asphyxiation from fluid bolus.  Patient is on room air and tolerating p.o. however remains significantly ataxic with some element of volume depletion.  Has been working with therapy however remains ataxic with only 25 feet of independent walking and associated dizziness and limitation by pain.  I also suspect possible mild concussion not visible on imaging that could be underlying some of the unsteadiness.  2D echocardiogram is reassuring.  Plan: Patient will benefit from placement in skilled nursing facility as next level of care. Continue fall precautions and therapy evaluations Regular orthostatics TOC consulted for skilled nursing facility placement Remains medically ready for discharge Authorization started 7/1  History of anxiety Continue Home alprazolam    History of asthma No evidence of exacerbation.  Continue as needed nebulizer   Traumatic ecchymosis of hand, right, initial encounter Continue with sling in place   Traumatic ecchymosis of face Present on admission, supportive care   Recurrent major depressive disorder, in partial remission (HCC) Trazodone  50 mg nightly resumed   Uncontrolled type 2 diabetes mellitus with hyperglycemia, without long-term current use of insulin  (HCC) Oral agents.  SSI with bedtime coverage   Hypothyroidism Continue levothyroxine  25 mcg daily    Chronic pain syndrome PDMP reviewed Alprazolam  as above   UTI (urinary tract infection) Present on admission Home nitrofurantoin , 100 mg p.o. twice daily resumed on admission  to complete course   Prolonged QT syndrome Monitor serum magnesium    DVT prophylaxis: SQ heparin  Code Status: Full Family Communication: None Disposition Plan: Status is: Observation The patient will require care spanning > 2 midnights and should be moved to inpatient because: Ataxic gait.  Suspected postconcussive syndrome.  Unsafe discharge plan.   Will need skilled nurse facility placement.   Level of care: Telemetry Cardiac  Consultants:  None  Procedures:  None  Antimicrobials: None   Subjective: Seen and examined.  Resting in bed.  No acute events overnight.  Objective: Vitals:   07/21/23 0450 07/21/23 0500 07/21/23 0810 07/21/23 1159  BP: 137/73  115/71 (!) 107/53  Pulse: 75  80 80  Resp: 18     Temp: 98.1 F (36.7 C)  98.2 F (36.8 C) 98.1 F (36.7 C)  TempSrc:      SpO2: 100%  94% 94%  Weight:  80.6 kg    Height:       No intake or output data in the 24 hours ending 07/21/23 1307  Filed Weights   07/19/23 0455 07/20/23 0500 07/21/23 0500  Weight: 85.2 kg 85 kg 80.6 kg    Examination:  General exam: NAD.  Fatigued Respiratory system: Lungs clear.  No work of breathing.  Room air Cardiovascular system: S1-S2, RRR, no murmurs, no pedal edema Gastrointestinal system: Soft, NT/ND, normal bowel sounds Central nervous system: Alert and oriented. No focal neurological deficits. Extremities: Symmetric 5 x 5 power. Skin: Scattered ecchymoses on face and torso Psychiatry: Judgement and insight appear normal. Mood & affect appropriate.     Data Reviewed: I have personally reviewed following labs and imaging studies  CBC: Recent Labs  Lab 07/14/23 1335 07/15/23 0422 07/16/23 0838  WBC 6.9 6.9 7.5  NEUTROABS  --   --  3.7  HGB 14.9 13.9 13.8  HCT 44.1 40.1 40.3  MCV 89.5 89.5 88.8  PLT 172 143* 145*   Basic Metabolic Panel: Recent Labs  Lab 07/14/23 1335 07/14/23 2023 07/15/23 0422 07/16/23 0838  NA 136  --  138 139  K 3.5  --  2.9* 3.3*  CL 105  --  106 108  CO2 19*  --  25 24  GLUCOSE 174*  --  125* 116*  BUN 16  --  16 19  CREATININE 1.00  --  0.92 0.91  CALCIUM 8.9  --  8.6* 8.6*  MG  --  1.9  --  1.7   GFR: Estimated Creatinine Clearance: 58.1 mL/min (by C-G formula based on SCr of 0.91 mg/dL). Liver Function Tests: No results for input(s): AST, ALT, ALKPHOS, BILITOT,  PROT, ALBUMIN in the last 168 hours. No results for input(s): LIPASE, AMYLASE in the last 168 hours. No results for input(s): AMMONIA in the last 168 hours. Coagulation Profile: No results for input(s): INR, PROTIME in the last 168 hours. Cardiac Enzymes: No results for input(s): CKTOTAL, CKMB, CKMBINDEX, TROPONINI in the last 168 hours. BNP (last 3 results) No results for input(s): PROBNP in the last 8760 hours. HbA1C: No results for input(s): HGBA1C in the last 72 hours.  CBG: Recent Labs  Lab 07/18/23 1214 07/18/23 1611 07/18/23 2116 07/19/23 0735 07/19/23 1132  GLUCAP 176* 171* 130* 107* 210*   Lipid Profile: No results for input(s): CHOL, HDL, LDLCALC, TRIG, CHOLHDL, LDLDIRECT in the last 72 hours. Thyroid Function Tests: No results for input(s): TSH, T4TOTAL, FREET4, T3FREE, THYROIDAB in the last 72 hours. Anemia Panel: No results for input(s): VITAMINB12,  FOLATE, FERRITIN, TIBC, IRON, RETICCTPCT in the last 72 hours. Sepsis Labs: No results for input(s): PROCALCITON, LATICACIDVEN in the last 168 hours.  No results found for this or any previous visit (from the past 240 hours).       Radiology Studies: No results found.       Scheduled Meds:  aspirin  EC  81 mg Oral Daily   atenolol   100 mg Oral Daily   famotidine  20 mg Oral BID   heparin   5,000 Units Subcutaneous Q8H   levothyroxine   25 mcg Oral Q0600   loratadine  10 mg Oral Daily   losartan   25 mg Oral Daily   pantoprazole   40 mg Oral Daily   sodium chloride  flush  3 mL Intravenous Q12H   traZODone   50 mg Oral QHS   Continuous Infusions:     LOS: 0 days     Calvin KATHEE Robson, MD [facility]   If 7PM-7AM, please contact night-coverage  07/21/2023, 1:07 PM

## 2023-07-21 NOTE — Care Management Important Message (Signed)
 Important Message  Patient Details  Name: Shannon Flores MRN: [id number] Date of Birth: [birth year]   Important Message Given:  Yes - Medicare IM     Shannon Flores 07/21/2023, 12:38 PM

## 2023-07-21 NOTE — Progress Notes (Signed)
 Physical Therapy Treatment Patient Details Name: Shannon Flores MRN: [id number] DOB: 1950/10/28 Today's Date: 07/21/2023   History of Present Illness 73 year old female with history of GERD, hypothyroid, non-insulin -dependent diabetes mellitus, fibromyalgia, asthma, hypertension, who presents ED for chief concerns of a fall with multiple sites of bruising and syncope    PT Comments  Patient supine in bed on arrival and complaining of her persistent cough but agreeable to PT treatment session. Able to complete bed mobility with supervision and stood from EOB with CGA. Ambulated 65' with RW and CGA but decreased weightbearing through R wrist due to pain. Complaining of mild dizziness during ambulation which seemed to subside slightly with time. Discharge plan remains appropriate.     If plan is discharge home, recommend the following: A little help with walking and/or transfers;A little help with bathing/dressing/bathroom;Assist for transportation;Help with stairs or ramp for entrance;Assistance with cooking/housework   Can travel by private vehicle     Yes  Equipment Recommendations  None recommended by PT    Recommendations for Other Services       Precautions / Restrictions Precautions Precautions: Fall Recall of Precautions/Restrictions: Intact Restrictions Weight Bearing Restrictions Per Provider Order: No     Mobility  Bed Mobility Overal bed mobility: Needs Assistance Bed Mobility: Supine to Sit, Sit to Supine     Supine to sit: Supervision Sit to supine: Supervision        Transfers Overall transfer level: Needs assistance Equipment used: Rolling Kodah Maret (2 wheels) Transfers: Sit to/from Stand Sit to Stand: Contact guard assist                Ambulation/Gait Ambulation/Gait assistance: Contact guard assist Gait Distance (Feet): 70 Feet Assistive device: Rolling Nalini Alcaraz (2 wheels) Gait Pattern/deviations: Step-through pattern, Decreased step length -  right, Decreased step length - left Gait velocity: decreased     General Gait Details: reports dizziness with ambulation but subsides. Decreased weightbearing through R wrist due to pain   Stairs             Wheelchair Mobility     Tilt Bed    Modified Rankin (Stroke Patients Only)       Balance Overall balance assessment: Needs assistance Sitting-balance support: Feet supported Sitting balance-Leahy Scale: Good     Standing balance support: Bilateral upper extremity supported Standing balance-Leahy Scale: Fair                              Hotel manager: Impaired Factors Affecting Communication: Hearing impaired  Cognition Arousal: Alert Behavior During Therapy: WFL for tasks assessed/performed   PT - Cognitive impairments: No family/caregiver present to determine baseline                       PT - Cognition Comments: slight confusion noted. Often repeating herself and distracted. No family present to determine baseline Following commands: Intact      Cueing Cueing Techniques: Verbal cues, Tactile cues  Exercises      General Comments        Pertinent Vitals/Pain Pain Assessment Pain Assessment: Faces Faces Pain Scale: Hurts little more Pain Location: R wrist Pain Descriptors / Indicators: Discomfort, Aching, Guarding Pain Intervention(s): Limited activity within patient's tolerance, Monitored during session    Home Living                          Prior  Function            PT Goals (current goals can now be found in the care plan section) Acute Rehab PT Goals Patient Stated Goal: to go home when she feels better PT Goal Formulation: With patient Time For Goal Achievement: 07/29/23 Potential to Achieve Goals: Good Progress towards PT goals: Progressing toward goals    Frequency    Min 2X/week      PT Plan      Co-evaluation              AM-PAC PT 6 Clicks  Mobility   Outcome Measure  Help needed turning from your back to your side while in a flat bed without using bedrails?: A Little Help needed moving from lying on your back to sitting on the side of a flat bed without using bedrails?: A Little Help needed moving to and from a bed to a chair (including a wheelchair)?: A Little Help needed standing up from a chair using your arms (e.g., wheelchair or bedside chair)?: A Little Help needed to walk in hospital room?: A Little Help needed climbing 3-5 steps with a railing? : A Lot 6 Click Score: 17    End of Session   Activity Tolerance: Patient tolerated treatment well Patient left: in bed;with call bell/phone within reach;with bed alarm set Nurse Communication: Mobility status PT Visit Diagnosis: Other abnormalities of gait and mobility (R26.89);Difficulty in walking, not elsewhere classified (R26.2);Muscle weakness (generalized) (M62.81)     Time: 8556-8542 PT Time Calculation (min) (ACUTE ONLY): 14 min  Charges:    $Therapeutic Activity: 8-22 mins PT General Charges $$ ACUTE PT VISIT: 1 Visit                     Maryanne Finder, PT, DPT Physical Therapist - [facility]    Ivyonna Hoelzel A Eilan Mcinerny 07/21/2023, 3:43 PM

## 2023-07-21 NOTE — Plan of Care (Signed)
  Problem: Coping: Goal: Ability to adjust to condition or change in health will improve Outcome: Progressing   Problem: Fluid Volume: Goal: Ability to maintain a balanced intake and output will improve Outcome: Progressing   Problem: Nutritional: Goal: Maintenance of adequate nutrition will improve Outcome: Progressing   Problem: Skin Integrity: Goal: Risk for impaired skin integrity will decrease Outcome: Progressing   Problem: Education: Goal: Knowledge of condition and prescribed therapy will improve Outcome: Progressing   Problem: Activity: Goal: Risk for activity intolerance will decrease Outcome: Progressing   Problem: Pain Managment: Goal: General experience of comfort will improve and/or be controlled Outcome: Progressing   Problem: Skin Integrity: Goal: Risk for impaired skin integrity will decrease Outcome: Progressing

## 2023-07-21 NOTE — Plan of Care (Signed)
  Problem: Education: Goal: Ability to describe self-care measures that may prevent or decrease complications (Diabetes Survival Skills Education) will improve Outcome: Progressing Goal: Individualized Educational Video(s) Outcome: Progressing   Problem: Coping: Goal: Ability to adjust to condition or change in health will improve Outcome: Progressing   Problem: Fluid Volume: Goal: Ability to maintain a balanced intake and output will improve Outcome: Progressing   Problem: Health Behavior/Discharge Planning: Goal: Ability to identify and utilize available resources and services will improve Outcome: Progressing Goal: Ability to manage health-related needs will improve Outcome: Progressing   Problem: Metabolic: Goal: Ability to maintain appropriate glucose levels will improve Outcome: Progressing   Problem: Nutritional: Goal: Maintenance of adequate nutrition will improve Outcome: Progressing Goal: Progress toward achieving an optimal weight will improve Outcome: Progressing   Problem: Skin Integrity: Goal: Risk for impaired skin integrity will decrease Outcome: Progressing   Problem: Tissue Perfusion: Goal: Adequacy of tissue perfusion will improve Outcome: Progressing   Problem: Education: Goal: Knowledge of condition and prescribed therapy will improve Outcome: Progressing   Problem: Cardiac: Goal: Will achieve and/or maintain adequate cardiac output Outcome: Progressing   Problem: Physical Regulation: Goal: Complications related to the disease process, condition or treatment will be avoided or minimized Outcome: Progressing   Problem: Education: Goal: Knowledge of General Education information will improve Description: Including pain rating scale, medication(s)/side effects and non-pharmacologic comfort measures Outcome: Progressing   Problem: Health Behavior/Discharge Planning: Goal: Ability to manage health-related needs will improve Outcome:  Progressing   Problem: Clinical Measurements: Goal: Ability to maintain clinical measurements within normal limits will improve Outcome: Progressing Goal: Will remain free from infection Outcome: Progressing Goal: Diagnostic test results will improve Outcome: Progressing Goal: Respiratory complications will improve Outcome: Progressing Goal: Cardiovascular complication will be avoided Outcome: Progressing   Problem: Activity: Goal: Risk for activity intolerance will decrease Outcome: Progressing   Problem: Nutrition: Goal: Adequate nutrition will be maintained Outcome: Progressing   Problem: Coping: Goal: Level of anxiety will decrease Outcome: Progressing   Problem: Elimination: Goal: Will not experience complications related to bowel motility Outcome: Progressing Goal: Will not experience complications related to urinary retention Outcome: Progressing   Problem: Pain Managment: Goal: General experience of comfort will improve and/or be controlled Outcome: Progressing   Problem: Safety: Goal: Ability to remain free from injury will improve Outcome: Progressing   Problem: Skin Integrity: Goal: Risk for impaired skin integrity will decrease Outcome: Progressing

## 2023-07-21 NOTE — Plan of Care (Signed)
  Problem: Education: Goal: Ability to describe self-care measures that may prevent or decrease complications (Diabetes Survival Skills Education) will improve Outcome: Progressing   Problem: Coping: Goal: Ability to adjust to condition or change in health will improve Outcome: Progressing   Problem: Fluid Volume: Goal: Ability to maintain a balanced intake and output will improve Outcome: Progressing   Problem: Health Behavior/Discharge Planning: Goal: Ability to identify and utilize available resources and services will improve Outcome: Progressing Goal: Ability to manage health-related needs will improve Outcome: Progressing   Problem: Metabolic: Goal: Ability to maintain appropriate glucose levels will improve Outcome: Progressing   Problem: Nutritional: Goal: Maintenance of adequate nutrition will improve Outcome: Progressing Goal: Progress toward achieving an optimal weight will improve Outcome: Progressing   Problem: Skin Integrity: Goal: Risk for impaired skin integrity will decrease Outcome: Progressing   Problem: Tissue Perfusion: Goal: Adequacy of tissue perfusion will improve Outcome: Progressing   Problem: Education: Goal: Knowledge of condition and prescribed therapy will improve Outcome: Progressing   Problem: Cardiac: Goal: Will achieve and/or maintain adequate cardiac output Outcome: Progressing   Problem: Physical Regulation: Goal: Complications related to the disease process, condition or treatment will be avoided or minimized Outcome: Progressing   Problem: Education: Goal: Knowledge of General Education information will improve Description: Including pain rating scale, medication(s)/side effects and non-pharmacologic comfort measures Outcome: Progressing   Problem: Health Behavior/Discharge Planning: Goal: Ability to manage health-related needs will improve Outcome: Progressing   Problem: Clinical Measurements: Goal: Ability to maintain  clinical measurements within normal limits will improve Outcome: Progressing Goal: Will remain free from infection Outcome: Progressing Goal: Diagnostic test results will improve Outcome: Progressing Goal: Respiratory complications will improve Outcome: Progressing Goal: Cardiovascular complication will be avoided Outcome: Progressing   Problem: Activity: Goal: Risk for activity intolerance will decrease Outcome: Progressing   Problem: Nutrition: Goal: Adequate nutrition will be maintained Outcome: Progressing   Problem: Coping: Goal: Level of anxiety will decrease Outcome: Progressing   Problem: Elimination: Goal: Will not experience complications related to bowel motility Outcome: Progressing Goal: Will not experience complications related to urinary retention Outcome: Progressing   Problem: Pain Managment: Goal: General experience of comfort will improve and/or be controlled Outcome: Progressing   Problem: Safety: Goal: Ability to remain free from injury will improve Outcome: Progressing   Problem: Skin Integrity: Goal: Risk for impaired skin integrity will decrease Outcome: Progressing

## 2023-07-21 NOTE — TOC Progression Note (Signed)
 Transition of Care [facility]) - Progression Note    Patient Details  Name: Shannon Flores MRN: [id number] Date of Birth: 27-Sep-1950  Transition of Care [facility]) CM/SW Contact  Tomasa JAYSON Childes, RN Phone Number: 07/21/2023, 2:05 PM  Clinical Narrative:    [name] pending for [facility].          Expected Discharge Plan and Services                                               Social Determinants of Health (SDOH) Interventions SDOH Screenings   Food Insecurity: No Food Insecurity (07/14/2023)  Recent Concern: Food Insecurity - Food Insecurity Present (04/29/2023)   Received from [facility]  Housing: Low Risk  (07/14/2023)  Transportation Needs: Unmet Transportation Needs (07/14/2023)  Utilities: Not At Risk (07/14/2023)  Financial Resource Strain: Medium Risk (04/29/2023)   Received from [facility]  Physical Activity: Inactive (04/29/2023)   Received from [facility]  Social Connections: Moderately Isolated (07/14/2023)  Stress: Stress Concern Present (04/29/2023)   Received from [facility]  Tobacco Use: Low Risk  (07/14/2023)  Health Literacy: Adequate Health Literacy (04/29/2023)   Received from [facility]    Readmission Risk Interventions     No data to display

## 2023-07-22 DIAGNOSIS — E1165 Type 2 diabetes mellitus with hyperglycemia: Secondary | ICD-10-CM

## 2023-07-22 DIAGNOSIS — R55 Syncope and collapse: Secondary | ICD-10-CM | POA: Diagnosis not present

## 2023-07-22 DIAGNOSIS — Z8709 Personal history of other diseases of the respiratory system: Secondary | ICD-10-CM | POA: Diagnosis not present

## 2023-07-22 DIAGNOSIS — E039 Hypothyroidism, unspecified: Secondary | ICD-10-CM

## 2023-07-22 DIAGNOSIS — Z8659 Personal history of other mental and behavioral disorders: Secondary | ICD-10-CM

## 2023-07-22 NOTE — Progress Notes (Signed)
 Occupational Therapy Treatment Patient Details Name: Shannon Flores MRN: [id number] DOB: Aug 11, 1950 Today's Date: 07/22/2023   History of present illness 73 year old female with history of GERD, hypothyroid, non-insulin -dependent diabetes mellitus, fibromyalgia, asthma, hypertension, who presents ED for chief concerns of a fall with multiple sites of bruising and syncope   OT comments  Shannon Flores seen for OT treatment on this date. Upon arrival to room pt seated in bed, agreeable to tx. Pt requires CGA + RW for STS and ambulation to toilet; SUPERVISION for pericare, MIN A for brief management due to R wrist brace. Pt reports no dizziness with position changes and no LOB observed; requires assistance carrying items for self-care which increases fall risk. Pt making good progress toward goals, will continue to follow POC. Discharge recommendation remains appropriate.        If plan is discharge home, recommend the following:  A little help with walking and/or transfers;A little help with bathing/dressing/bathroom;Assistance with cooking/housework;Assist for transportation;Help with stairs or ramp for entrance   Equipment Recommendations  BSC/3in1;Other (comment)    Recommendations for Other Services      Precautions / Restrictions Precautions Precautions: Fall Recall of Precautions/Restrictions: Intact Restrictions Weight Bearing Restrictions Per Provider Order: No       Mobility Bed Mobility Overal bed mobility: Needs Assistance Bed Mobility: Supine to Sit     Supine to sit: Supervision          Transfers Overall transfer level: Needs assistance Equipment used: Rolling walker (2 wheels) Transfers: Sit to/from Stand Sit to Stand: Contact guard assist                 Balance Overall balance assessment: Needs assistance Sitting-balance support: Feet supported Sitting balance-Leahy Scale: Good     Standing balance support: During functional activity, No upper  extremity supported Standing balance-Leahy Scale: Fair                             ADL either performed or assessed with clinical judgement   ADL Overall ADL's : Needs assistance/impaired                                       General ADL Comments: CGA for toileting, SUPERVISION for pericare, MIN A for brief management    Extremity/Trunk Assessment Upper Extremity Assessment Upper Extremity Assessment: Generalized weakness;RUE deficits/detail RUE Deficits / Details: brace donned for comfort with increased pain - no fx per xray   Lower Extremity Assessment Lower Extremity Assessment: Generalized weakness        Vision       Perception     Praxis     Communication Communication Communication: No apparent difficulties   Cognition Arousal: Alert Behavior During Therapy: WFL for tasks assessed/performed Cognition: No apparent impairments                               Following commands: Intact        Cueing   Cueing Techniques: Verbal cues, Gestural cues  Exercises      Shoulder Instructions       General Comments      Pertinent Vitals/ Pain       Pain Assessment Pain Assessment: No/denies pain  Home Living  Prior Functioning/Environment              Frequency  Min 2X/week        Progress Toward Goals  OT Goals(current goals can now be found in the care plan section)  Progress towards OT goals: Progressing toward goals  Acute Rehab OT Goals Patient Stated Goal: to feel better OT Goal Formulation: With patient Time For Goal Achievement: 08/05/23 Potential to Achieve Goals: Good ADL Goals Pt Will Perform Grooming: with modified independence;standing Pt Will Perform Lower Body Dressing: with modified independence;sit to/from stand Pt Will Transfer to Toilet: with modified independence;ambulating Pt Will Perform Toileting - Clothing  Manipulation and hygiene: with modified independence;sit to/from stand  Plan      Co-evaluation                 AM-PAC OT 6 Clicks Daily Activity     Outcome Measure   Help from another person eating meals?: A Little Help from another person taking care of personal grooming?: A Little Help from another person toileting, which includes using toliet, bedpan, or urinal?: A Little Help from another person bathing (including washing, rinsing, drying)?: A Little Help from another person to put on and taking off regular upper body clothing?: A Little Help from another person to put on and taking off regular lower body clothing?: A Little 6 Click Score: 18    End of Session Equipment Utilized During Treatment: Gait belt;Rolling walker (2 wheels)  OT Visit Diagnosis: Unsteadiness on feet (R26.81);Repeated falls (R29.6);Muscle weakness (generalized) (M62.81)   Activity Tolerance Patient tolerated treatment well   Patient Left in chair;with call bell/phone within reach;with chair alarm set   Nurse Communication          Time: 8890-8860 OT Time Calculation (min): 30 min  Charges:    Shannon Flores, Student OT   [employer] 07/22/2023, 12:28 PM

## 2023-07-22 NOTE — TOC Progression Note (Signed)
 Transition of Care [facility]) - Progression Note    Patient Details  Name: Shannon Flores MRN: [id number] Date of Birth: 07-Aug-1950  [facility]) CM/SW Contact  Lauraine [name], LCSW Phone Number: 07/22/2023, 11:55 AM  Clinical Narrative: CSW updated patient. Discussed potential for one of her children to transport her to the SNF if EMS doesn't think she qualifies. Insurance is offering a peer-to-peer review. Sent details to MD. [name] is today at 3:30.    Expected Discharge Plan and Services                                               Social Determinants of Health (SDOH) Interventions SDOH Screenings   Food Insecurity: No Food Insecurity (07/14/2023)  Recent Concern: Food Insecurity - Food Insecurity Present (04/29/2023)   Received from [facility]  Housing: Low Risk  (07/14/2023)  Transportation Needs: Unmet Transportation Needs (07/14/2023)  Utilities: Not At Risk (07/14/2023)  Financial Resource Strain: Medium Risk (04/29/2023)   Received from [facility]  Physical Activity: Inactive (04/29/2023)   Received from [facility]  Social Connections: Moderately Isolated (07/14/2023)  Stress: Stress Concern Present (04/29/2023)   Received from [facility]  Tobacco Use: Low Risk  (07/14/2023)  Health Literacy: Adequate Health Literacy (04/29/2023)   Received from [facility]    Readmission Risk Interventions     No data to display

## 2023-07-22 NOTE — TOC Progression Note (Addendum)
 Transition of Care [facility]) - Progression Note    Patient Details  Name: Shannon Flores MRN: [id number] Date of Birth: 16-May-1950  Transition of Care [facility]) CM/SW Contact  Tomasa JAYSON Childes, RN Phone Number: 07/22/2023, 3:47 PM  Clinical Narrative:    Patient advised her SNF request was denied. She as advised of her right to appeal this decision and was provided the number for fast track appeals should she choose to appeal. Patient provided with Fast Track appeal number [phone]. She has been informed Fast track appeals are tme sensitive. She advised she can call or a or a patient representative can call on her behalf in her presence. RNCM also advised patient HH can be arranged if she chooses to discharge home.         Expected Discharge Plan and Services                                               Social Determinants of Health (SDOH) Interventions SDOH Screenings   Food Insecurity: No Food Insecurity (07/14/2023)  Recent Concern: Food Insecurity - Food Insecurity Present (04/29/2023)   Received from [facility]  Housing: Low Risk  (07/14/2023)  Transportation Needs: Unmet Transportation Needs (07/14/2023)  Utilities: Not At Risk (07/14/2023)  Financial Resource Strain: Medium Risk (04/29/2023)   Received from [facility]  Physical Activity: Inactive (04/29/2023)   Received from [facility]  Social Connections: Moderately Isolated (07/14/2023)  Stress: Stress Concern Present (04/29/2023)   Received from [facility]  Tobacco Use: Low Risk  (07/14/2023)  Health Literacy: Adequate Health Literacy (04/29/2023)   Received from [facility]    Readmission Risk Interventions     No data to display

## 2023-07-22 NOTE — Progress Notes (Signed)
 PROGRESS NOTE    Shannon Flores  Shannon Flores:[id number] DOB: 08-Dec-1950 DOA: 07/14/2023 PCP: Shannon Flores    Brief Narrative:   73 year old female with history of GERD, hypothyroid, non-insulin -dependent diabetes mellitus, fibromyalgia, asthma, hypertension, who presents ED for chief concerns of a fall with multiple sites of bruising and syncope.   Patient reports that she was having some diarrhea and noted that she is on antibiotic so she needs to take her antibiotic with food.  At 3 AM this morning she remembered that she did not eat yet and that since she has been on antibiotic, she should get up and eat something.  She then made herself a peanut butter sandwich.  As she is eating, she felt the food getting stuck in her throat and she could not swallow and could not breathe.  She could not find her phone.  So she intended to run outside the door to come to her neighbors house for help.  In the next thing she remembers she was on the floor and felt bruised all over.  She then noted that there was the food bolus is laying next to her so she must have spit it up or vomited up the food.  She then called EMS.   Patient has bruising and tenderness all over. Patient is able to flex and extend into a gripping motion with her left hand at bedside.  7/2: I did peer to peer with her insurance but she is still declined for SNF by Payor.  Assessment & Plan:   Principal Problem:   Syncope Active Problems:   History of asthma   History of anxiety   Ecchymosis of left eye   Ecchymosis of forearm   Ecchymosis of wrist   Traumatic ecchymosis of face   Traumatic ecchymosis of hand, left, initial encounter   Traumatic ecchymosis of hand, right, initial encounter   Chronic pain syndrome   Hypothyroidism   Uncontrolled type 2 diabetes mellitus with hyperglycemia, without long-term current use of insulin  (HCC)   Recurrent major depressive disorder, in partial remission (HCC)   Prolonged QT  syndrome   UTI (urinary tract infection)  Syncope Suspected postconcussion syndrome  Broad differential at this point.  Possibly hypoxia in the setting of asphyxiation from fluid bolus.  Patient is on room air and tolerating p.o. however remains significantly ataxic with some element of volume depletion.  Has been working with therapy however remains ataxic with only 25 feet of independent walking and associated dizziness and limitation by pain.  I also suspect possible mild concussion not visible on imaging that could be underlying some of the unsteadiness.  2D echocardiogram is reassuring.  Plan: Patient will benefit from placement in skilled nursing facility as next level of care.  I did peer to peer with her insurance today but she is still declined for SNF.  Patient is made aware and is still planning to appeal this.  TOC working on this Continue fall precautions and therapy evaluations TOC consulted for skilled nursing facility placement versus home health Remains medically ready for discharge  History of anxiety Continue Home Shannon Flores    History of asthma No evidence of exacerbation.  Continue as needed nebulizer   Traumatic ecchymosis of hand, right, initial encounter Continue with sling in place   Traumatic ecchymosis of face Present on admission, supportive care   Recurrent major depressive disorder, in partial remission (HCC) Shannon Flores  50 mg nightly resumed   Uncontrolled type 2 diabetes mellitus with hyperglycemia, without  long-term current use of insulin  (HCC) Oral agents.  SSI with bedtime coverage   Hypothyroidism Continue levothyroxine  25 mcg daily    Chronic pain syndrome PDMP reviewed Shannon Flores  as above   UTI (urinary tract infection) Present on admission Home nitrofurantoin , 100 mg p.o. twice daily resumed on admission to complete course   Prolonged QT syndrome Monitor serum magnesium    DVT prophylaxis: SQ Shannon Flores  Code Status: Full Family  Communication: None at bedside Disposition Plan: Status is: Observation The patient will require care spanning > 2 midnights and should be moved to inpatient because: Ataxic gait.  Suspected postconcussive syndrome.  Unsafe discharge plan.  Will need skilled nurse facility placement.   Level of care: Telemetry Cardiac    Subjective: Seen and examined.  Resting in the chair.  She would like to get an update if she could go to SNF.  I did share with her regarding insurance denial.  She is not too happy and would like to appeal.  She is requesting discussed with TOC for neck steps  Objective: Vitals:   07/22/23 0804 07/22/23 1026 07/22/23 1229 07/22/23 1624  BP: (!) 106/59 99/72 (!) 108/57 107/67  Pulse: 77 76 74 79  Resp: 17  18 18   Temp: 98.5 F (36.9 C)  98.9 F (37.2 C) 98.3 F (36.8 C)  TempSrc: Oral  Oral   SpO2: 95%  95% 93%  Weight:      Height:        Intake/Output Summary (Last 24 hours) at 07/22/2023 1914 Last data filed at 07/22/2023 1020 Gross per 24 hour  Intake 0 ml  Output --  Net 0 ml    Filed Weights   07/20/23 0500 07/21/23 0500 07/22/23 0511  Weight: 85 kg 80.6 kg 84 kg    Examination:  General exam: NAD.  Fatigued Respiratory system: Lungs clear.  No work of breathing.  Room air Cardiovascular system: S1-S2, RRR, no murmurs, no pedal edema Gastrointestinal system: Soft, NT/ND, normal bowel sounds Central nervous system: Alert and oriented. No focal neurological deficits. Extremities: Symmetric 5 x 5 power. Skin: Scattered ecchymoses on face and torso Psychiatry: Judgement and insight appear normal. Mood & affect appropriate.     Data Reviewed: I have personally reviewed following labs and imaging studies  CBC: Recent Labs  Lab 07/16/23 0838  WBC 7.5  NEUTROABS 3.7  HGB 13.8  HCT 40.3  MCV 88.8  PLT 145*   Basic Metabolic Panel: Recent Labs  Lab 07/16/23 0838  NA 139  K 3.3*  CL 108  CO2 24  GLUCOSE 116*  BUN 19  CREATININE  0.91  CALCIUM 8.6*  MG 1.7   GFR: Estimated Creatinine Clearance: 59.3 mL/min (by C-G formula based on SCr of 0.91 mg/dL). Liver Function Tests: No results for input(s): AST, ALT, ALKPHOS, BILITOT, PROT, ALBUMIN in the last 168 hours. No results for input(s): LIPASE, AMYLASE in the last 168 hours. No results for input(s): AMMONIA in the last 168 hours. Coagulation Profile: No results for input(s): INR, PROTIME in the last 168 hours. Cardiac Enzymes: No results for input(s): CKTOTAL, CKMB, CKMBINDEX, TROPONINI in the last 168 hours. BNP (last 3 results) No results for input(s): PROBNP in the last 8760 hours. HbA1C: No results for input(s): HGBA1C in the last 72 hours.  CBG: Recent Labs  Lab 07/18/23 1214 07/18/23 1611 07/18/23 2116 07/19/23 0735 07/19/23 1132  GLUCAP 176* 171* 130* 107* 210*   Lipid Profile: No results for input(s): CHOL, HDL, LDLCALC, TRIG, CHOLHDL,  LDLDIRECT in the last 72 hours. Thyroid Function Tests: No results for input(s): TSH, T4TOTAL, FREET4, T3FREE, THYROIDAB in the last 72 hours. Anemia Panel: No results for input(s): VITAMINB12, FOLATE, FERRITIN, TIBC, IRON, RETICCTPCT in the last 72 hours. Sepsis Labs: No results for input(s): PROCALCITON, LATICACIDVEN in the last 168 hours.  No results found for this or any previous visit (from the past 240 hours).       Radiology Studies: No results found.       Scheduled Meds:  aspirin  EC  81 mg Oral Daily   atenolol   100 mg Oral Daily   famotidine  20 mg Oral BID   Shannon Flores   5,000 Units Subcutaneous Q8H   levothyroxine   25 mcg Oral Q0600   loratadine  10 mg Oral Daily   losartan   25 mg Oral Daily   pantoprazole   40 mg Oral Daily   sodium chloride  flush  3 mL Intravenous Q12H   Shannon Flores   50 mg Oral QHS   Continuous Infusions:     LOS: 0 days   Time spent 35 minutes  Cresencio Fairly, [facility]   If  7PM-7AM, please contact night-coverage  07/22/2023, 7:14 PM

## 2023-07-22 NOTE — Plan of Care (Signed)
  Problem: Education: Goal: Ability to describe self-care measures that may prevent or decrease complications (Diabetes Survival Skills Education) will improve Outcome: Progressing Goal: Individualized Educational Video(s) Outcome: Progressing   Problem: Coping: Goal: Ability to adjust to condition or change in health will improve Outcome: Progressing   Problem: Fluid Volume: Goal: Ability to maintain a balanced intake and output will improve Outcome: Progressing   Problem: Health Behavior/Discharge Planning: Goal: Ability to identify and utilize available resources and services will improve Outcome: Progressing Goal: Ability to manage health-related needs will improve Outcome: Progressing   Problem: Metabolic: Goal: Ability to maintain appropriate glucose levels will improve Outcome: Progressing   Problem: Nutritional: Goal: Maintenance of adequate nutrition will improve Outcome: Progressing Goal: Progress toward achieving an optimal weight will improve Outcome: Progressing   Problem: Skin Integrity: Goal: Risk for impaired skin integrity will decrease Outcome: Progressing   Problem: Tissue Perfusion: Goal: Adequacy of tissue perfusion will improve Outcome: Progressing   Problem: Education: Goal: Knowledge of condition and prescribed therapy will improve Outcome: Progressing   Problem: Cardiac: Goal: Will achieve and/or maintain adequate cardiac output Outcome: Progressing   Problem: Physical Regulation: Goal: Complications related to the disease process, condition or treatment will be avoided or minimized Outcome: Progressing   Problem: Education: Goal: Knowledge of General Education information will improve Description: Including pain rating scale, medication(s)/side effects and non-pharmacologic comfort measures Outcome: Progressing   Problem: Health Behavior/Discharge Planning: Goal: Ability to manage health-related needs will improve Outcome:  Progressing   Problem: Clinical Measurements: Goal: Ability to maintain clinical measurements within normal limits will improve Outcome: Progressing Goal: Will remain free from infection Outcome: Progressing Goal: Diagnostic test results will improve Outcome: Progressing Goal: Respiratory complications will improve Outcome: Progressing Goal: Cardiovascular complication will be avoided Outcome: Progressing   Problem: Activity: Goal: Risk for activity intolerance will decrease Outcome: Progressing   Problem: Nutrition: Goal: Adequate nutrition will be maintained Outcome: Progressing   Problem: Coping: Goal: Level of anxiety will decrease Outcome: Progressing   Problem: Elimination: Goal: Will not experience complications related to bowel motility Outcome: Progressing Goal: Will not experience complications related to urinary retention Outcome: Progressing   Problem: Pain Managment: Goal: General experience of comfort will improve and/or be controlled Outcome: Progressing   Problem: Safety: Goal: Ability to remain free from injury will improve Outcome: Progressing   Problem: Skin Integrity: Goal: Risk for impaired skin integrity will decrease Outcome: Progressing

## 2023-07-22 NOTE — Progress Notes (Signed)
 After Peer2Peer SNF denied 07/0/25-/07/21/23 Plan auth id # [id number] believe intermit services would be more suitable for patient. Appeal # [phone] or fax to [phone].

## 2023-07-22 NOTE — TOC Progression Note (Signed)
 Transition of Care [facility]) - Progression Note    Patient Details  Name: [name] MRN: [id number] Date of Birth: May 06, 1950  Transition [facility] Care [facility]) CM/SW Contact  Tomasa JAYSON Childes, RN Phone Number: 07/22/2023, 8:36 AM  Clinical Narrative:    Per [facility] assistant Rojelio, patient Shannon Flores is still pending.          Expected Discharge Plan and Services                                               Social Determinants of Health (SDOH) Interventions SDOH Screenings   Food Insecurity: No Food Insecurity (07/14/2023)  Recent Concern: Food Insecurity - Food Insecurity Present (04/29/2023)   Received from [facility]  Housing: Low Risk  (07/14/2023)  Transportation Needs: Unmet Transportation Needs (07/14/2023)  Utilities: Not At Risk (07/14/2023)  Financial Resource Strain: Medium Risk (04/29/2023)   Received from [facility]  Physical Activity: Inactive (04/29/2023)   Received from [facility]  Social Connections: Moderately Isolated (07/14/2023)  Stress: Stress Concern Present (04/29/2023)   Received from [facility]  Tobacco Use: Low Risk  (07/14/2023)  Health Literacy: Adequate Health Literacy (04/29/2023)   Received from [facility]    Readmission Risk Interventions     No data to display

## 2023-07-23 ENCOUNTER — Other Ambulatory Visit: Payer: Self-pay

## 2023-07-23 DIAGNOSIS — S0083XA Contusion of other part of head, initial encounter: Secondary | ICD-10-CM

## 2023-07-23 DIAGNOSIS — S60222A Contusion of left hand, initial encounter: Secondary | ICD-10-CM

## 2023-07-23 DIAGNOSIS — R55 Syncope and collapse: Secondary | ICD-10-CM

## 2023-07-23 DIAGNOSIS — Z8659 Personal history of other mental and behavioral disorders: Secondary | ICD-10-CM | POA: Diagnosis not present

## 2023-07-23 LAB — URINALYSIS, COMPLETE (UACMP) WITH MICROSCOPIC
Bilirubin Urine: NEGATIVE
Glucose, UA: NEGATIVE mg/dL
Ketones, ur: NEGATIVE mg/dL
Nitrite: NEGATIVE
Protein, ur: NEGATIVE mg/dL
Specific Gravity, Urine: 1.015 (ref 1.005–1.030)
WBC, UA: >50 WBC/hpf (ref 0–5)
pH: 5 (ref 5.0–8.0)

## 2023-07-23 LAB — CBC
HCT: 42.6 % (ref 36.0–46.0)
Hemoglobin: 14.3 g/dL (ref 12.0–15.0)
MCH: 30.7 pg (ref 26.0–34.0)
MCHC: 33.6 g/dL (ref 30.0–36.0)
MCV: 91.4 fL (ref 80.0–100.0)
Platelets: 194 10*3/uL (ref 150–400)
RBC: 4.66 MIL/uL (ref 3.87–5.11)
RDW: 12.7 % (ref 11.5–15.5)
WBC: 7.3 10*3/uL (ref 4.0–10.5)
nRBC: 0 % (ref 0.0–0.2)

## 2023-07-23 LAB — BASIC METABOLIC PANEL WITH GFR
Anion gap: 12 (ref 5–15)
BUN: 21 mg/dL (ref 8–23)
CO2: 25 mmol/L (ref 22–32)
Calcium: 9.4 mg/dL (ref 8.9–10.3)
Chloride: 101 mmol/L (ref 98–111)
Creatinine, Ser: 0.82 mg/dL (ref 0.44–1.00)
GFR, Estimated: >60 mL/min (ref >60)
Glucose, Bld: 121 mg/dL — ABNORMAL HIGH (ref 70–99)
Potassium: 4 mmol/L (ref 3.5–5.1)
Sodium: 138 mmol/L (ref 135–145)

## 2023-07-23 LAB — TROPONIN I (HIGH SENSITIVITY)
Troponin I (High Sensitivity): 4 ng/L (ref <18)
Troponin I (High Sensitivity): 4 ng/L (ref <18)

## 2023-07-23 MED ORDER — SENNOSIDES-DOCUSATE SODIUM 8.6-50 MG PO TABS
2.0000 | ORAL_TABLET | Freq: Two times a day (BID) | ORAL | Status: DC
  Administered 2023-07-23 – 2023-07-27 (×7): 2 via ORAL
  Filled 2023-07-23 (×9): qty 2

## 2023-07-23 NOTE — Progress Notes (Addendum)
 Physical Therapy Treatment Patient Details Name: Shannon Flores MRN: [id number] DOB: [birth year] Today's Date: 07/23/2023   History of Present Illness 73 year old female with history of GERD, hypothyroid, non-insulin -dependent diabetes mellitus, fibromyalgia, asthma, hypertension, who presents ED for chief concerns of a fall with multiple sites of bruising and syncope    PT Comments  Pt was pleasant and motivated to participate during the session and put forth good effort throughout. Pt's BP readings throughout session were as follows: 119/62 in supine, 109/83 in seated, 96/75 in standing at EOB, and 106/80 in seated after ambulation. Pt reported mild dizziness/nausea symptoms throughout ambulation, which limited the distance pt ambulated. Pt completed 10 foot walk test in 76.4 seconds, which is an indication that pt is at increased risk of falls in the future. Pt will benefit from continued PT services upon discharge to safely address deficits listed in patient problem list for decreased caregiver assistance and eventual return to PLOF.    If plan is discharge home, recommend the following: A little help with walking and/or transfers;A little help with bathing/dressing/bathroom;Assist for transportation;Help with stairs or ramp for entrance;Assistance with cooking/housework   Can travel by private vehicle     Yes  Equipment Recommendations       Recommendations for Other Services       Precautions / Restrictions Precautions Precautions: Fall Recall of Precautions/Restrictions: Intact Restrictions Weight Bearing Restrictions Per Provider Order: No     Mobility  Bed Mobility Overal bed mobility: Needs Assistance Bed Mobility: Supine to Sit     Supine to sit: Modified independent (Device/Increase time), Used rails     General bed mobility comments: Pt required use of bed rails to come to sitting EOB from supine, but no physical independence required    Transfers Overall  transfer level: Needs assistance Equipment used: Rolling walker (2 wheels) Transfers: Sit to/from Stand Sit to Stand: Contact guard assist           General transfer comment: Pt required CGA to complete STS from EOB with RW and increased time due to R wrist pain, but remained steady throughout and no LOBs occured    Ambulation/Gait Ambulation/Gait assistance: Contact guard assist, +2 safety/equipment Gait Distance (Feet): 45 Feet Assistive device: Rolling walker (2 wheels) Gait Pattern/deviations: Step-through pattern, Decreased step length - right, Decreased step length - left Gait velocity: decreased, 0.13 feet/sec     General Gait Details: Pt reported mild dizziness/nausea symptoms with ambulation, which limited ambulation distance. Pt heavily reliant on RW to maintain balance. VC's were utilized to maintain RW with BOS, which improved as ambulation progressed. Pt with intermittent difficulty with foot sequencing and weight acceptance bilaterally. Pt remained steady throughout and no LOBs occured. A chair follow was utilized for pt safety.   Stairs             Wheelchair Mobility     Tilt Bed    Modified Rankin (Stroke Patients Only)       Balance Overall balance assessment: Needs assistance Sitting-balance support: Feet supported, No upper extremity supported Sitting balance-Leahy Scale: Good     Standing balance support: During functional activity, No upper extremity supported, Reliant on assistive device for balance Standing balance-Leahy Scale: Fair Standing balance comment: Pt heavily reliant on RW to maintain balance in stance and ambulation, but remained steady throughout and no LOBs occured  Communication Communication Communication: Impaired Factors Affecting Communication: Hearing impaired  Cognition Arousal: Alert Behavior During Therapy: WFL for tasks assessed/performed   PT - Cognitive impairments: No  apparent impairments                         Following commands: Intact      Cueing Cueing Techniques: Verbal cues, Gestural cues  Exercises      General Comments        Pertinent Vitals/Pain Pain Assessment Pain Assessment: 0-10 Pain Score: 8  Pain Location: Low back Pain Descriptors / Indicators: Constant, Discomfort Pain Intervention(s): Monitored during session    Home Living                          Prior Function            PT Goals (current goals can now be found in the care plan section) Progress towards PT goals: PT to reassess next treatment    Frequency    Min 2X/week      PT Plan      Co-evaluation              AM-PAC PT 6 Clicks Mobility   Outcome Measure  Help needed turning from your back to your side while in a flat bed without using bedrails?: A Little Help needed moving from lying on your back to sitting on the side of a flat bed without using bedrails?: A Little Help needed moving to and from a bed to a chair (including a wheelchair)?: A Little Help needed standing up from a chair using your arms (e.g., wheelchair or bedside chair)?: A Little Help needed to walk in hospital room?: A Little Help needed climbing 3-5 steps with a railing? : A Lot 6 Click Score: 17    End of Session Equipment Utilized During Treatment: Gait belt Activity Tolerance: Patient tolerated treatment well Patient left: Other (comment) (Pt was left in restroom, as pt stated that it was going to take a few minutes to complete toileting. Nursing staff was notified of pt being in the bathroom. Pt was instructed to not stand without assistance and to call nursing before coming to standing) Nurse Communication: Mobility status;Other (comment) (Nursing notified of pt being in the bathroom as PT ended session); BP readings throughout session PT Visit Diagnosis: Other abnormalities of gait and mobility (R26.89);Difficulty in walking, not elsewhere  classified (R26.2);Muscle weakness (generalized) (M62.81)     Time: 8884-8854 PT Time Calculation (min) (ACUTE ONLY): 30 min  Charges:                            Leontine Ingles, SPT 07/23/23, 1:17 PM  This entire session was performed under direct supervision and direction of a licensed therapist/therapist assistant. I have personally read, edited and approve of the note as written.  Carmin Deed, DPT

## 2023-07-23 NOTE — TOC Progression Note (Signed)
 Transition of Care [facility]) - Progression Note    Patient Details  Name: LILLIAHNA SCHUBRING MRN: [id number] Date of Birth: 11-03-1950  Transition of Care [facility]) CM/SW Contact  Lauraine [name], LCSW Phone Number: 07/23/2023, 9:52 AM  Clinical Narrative:  Met with patient. She confirmed daughter is appealing SNF denial. CSW faxed clinicals to [facility] Dept to support appeal process. Daughter and son-in-law are aware.  Expected Discharge Plan and Services                                               Social Determinants of Health (SDOH) Interventions SDOH Screenings   Food Insecurity: No Food Insecurity (07/14/2023)  Recent Concern: Food Insecurity - Food Insecurity Present (04/29/2023)   Received from [facility]  Housing: Low Risk  (07/14/2023)  Transportation Needs: Unmet Transportation Needs (07/14/2023)  Utilities: Not At Risk (07/14/2023)  Financial Resource Strain: Medium Risk (04/29/2023)   Received from [facility]  Physical Activity: Inactive (04/29/2023)   Received from [facility]  Social Connections: Moderately Isolated (07/14/2023)  Stress: Stress Concern Present (04/29/2023)   Received from [facility]  Tobacco Use: Low Risk  (07/14/2023)  Health Literacy: Adequate Health Literacy (04/29/2023)   Received from [facility]    Readmission Risk Interventions     No data to display

## 2023-07-23 NOTE — Discharge Instructions (Signed)

## 2023-07-23 NOTE — Progress Notes (Signed)
 PROGRESS NOTE    Shannon Flores  FMW:[id number] DOB: Apr 06, 1950 DOA: 07/14/2023 PCP: [name], MD    Brief Narrative:   73 year old female with history of GERD, hypothyroid, non-insulin -dependent diabetes mellitus, fibromyalgia, asthma, hypertension, who presents ED for chief concerns of a fall with multiple sites of bruising and syncope.   Patient reports that she was having some diarrhea and noted that she is on antibiotic so she needs to take her antibiotic with food.  At 3 AM this morning she remembered that she did not eat yet and that since she has been on antibiotic, she should get up and eat something.  She then made herself a peanut butter sandwich.  As she is eating, she felt the food getting stuck in her throat and she could not swallow and could not breathe.  She could not find her phone.  So she intended to run outside the door to come to her neighbors house for help.  In the next thing she remembers she was on the floor and felt bruised all over.  She then noted that there was the food bolus is laying next to her so she must have spit it up or vomited up the food.  She then called EMS.   Patient has bruising and tenderness all over. Patient is able to flex and extend into a gripping motion with her left hand at bedside.  7/2: I did peer to peer with her insurance but she is still declined for SNF by Payor. 7/3: had chest pain (anxiety related), checking UA as she's worried about UTI  Assessment & Plan:   Principal Problem:   Syncope Active Problems:   History of asthma   History of anxiety   Ecchymosis of left eye   Ecchymosis of forearm   Ecchymosis of wrist   Traumatic ecchymosis of face   Traumatic ecchymosis of hand, left, initial encounter   Traumatic ecchymosis of hand, right, initial encounter   Chronic pain syndrome   Hypothyroidism   Uncontrolled type 2 diabetes mellitus with hyperglycemia, without long-term current use of insulin  (HCC)   Recurrent  major depressive disorder, in partial remission (HCC)   Prolonged QT syndrome   UTI (urinary tract infection)  Syncope Suspected postconcussion syndrome likely hypoxia in the setting of asphyxiation from fluid bolus.  Patient is on room air and tolerating p.o. however remains significantly ataxic with some element of volume depletion. - continues to be at high risk for falls. Was orthostatic. she completed 10 foot walk test in 76.4 seconds, which is an indication that pt is at increased risk of falls in the future.   Patient will benefit from placement in skilled nursing facility as next level of care.  I did peer to peer with her insurance on 7/2 but was declined. Patient/family has appealed - await decision Continue fall precautions and therapy evaluations TOC consulted for skilled nursing facility placement versus home health Had some chest pain episode earlier - likely anxiety/chronic pain related. EKG showed NSR, will get serial troponins.  History of anxiety Continue Home alprazolam    History of asthma No evidence of exacerbation.  Continue as needed nebulizer   Traumatic ecchymosis of hand, right, initial encounter Continue with sling in place   Traumatic ecchymosis of face Present on admission, supportive care   Recurrent major depressive disorder, in partial remission (HCC) Trazodone  50 mg nightly resumed   Uncontrolled type 2 diabetes mellitus with hyperglycemia, without long-term current use of insulin  (HCC) Oral  agents.  SSI with bedtime coverage   Hypothyroidism Continue levothyroxine  25 mcg daily    Chronic pain syndrome PDMP reviewed Alprazolam  as above   UTI (urinary tract infection) Present on admission Home nitrofurantoin , 100 mg p.o. twice daily resumed on admission to complete course She is requesting Abx again - says she has UTI. Will check UA   Prolonged QT syndrome Monitor serum magnesium    DVT prophylaxis: SQ heparin  Code Status: Full Family  Communication: None at bedside Disposition Plan: Status is: Observation The patient will require care spanning > 2 midnights and should be moved to inpatient because: Ataxic gait.  Suspected postconcussive syndrome.  Unsafe discharge plan.  Will need skilled nurse facility placement.   Level of care: Telemetry Cardiac    Subjective: Seen and examined. Seem very upset, had feeling like elephant sitting on the chest later in the morning.   Objective: Vitals:   07/23/23 0920 07/23/23 1005 07/23/23 1209 07/23/23 1532  BP:   119/70 (!) 105/50  Pulse:   83 85  Resp: 18 16 18 18   Temp:   97.6 F (36.4 C) 98.6 F (37 C)  TempSrc:      SpO2:   98% (!) 73%  Weight:      Height:        Intake/Output Summary (Last 24 hours) at 07/23/2023 1729 Last data filed at 07/23/2023 1042 Gross per 24 hour  Intake 600 ml  Output --  Net 600 ml    Filed Weights   07/21/23 0500 07/22/23 0511 07/23/23 0535  Weight: 80.6 kg 84 kg 85.6 kg    Examination:  General exam: NAD.  Fatigued Respiratory system: Lungs clear.  No work of breathing.  Room air Cardiovascular system: S1-S2, RRR, no murmurs, no pedal edema Gastrointestinal system: Soft, NT/ND, normal bowel sounds Central nervous system: Alert and oriented. No focal neurological deficits. Extremities: Symmetric 5 x 5 power. Skin: Scattered ecchymoses on face and torso Psychiatry: Judgement and insight appear normal. Mood & affect appropriate.     Data Reviewed: I have personally reviewed following labs and imaging studies  CBC: Recent Labs  Lab 07/23/23 0533  WBC 7.3  HGB 14.3  HCT 42.6  MCV 91.4  PLT 194   Basic Metabolic Panel: Recent Labs  Lab 07/23/23 0533  NA 138  K 4.0  CL 101  CO2 25  GLUCOSE 121*  BUN 21  CREATININE 0.82  CALCIUM 9.4     CBG: Recent Labs  Lab 07/18/23 1214 07/18/23 1611 07/18/23 2116 07/19/23 0735 07/19/23 1132  GLUCAP 176* 171* 130* 107* 210*    Scheduled Meds:  aspirin  EC  81 mg  Oral Daily   atenolol   100 mg Oral Daily   famotidine  20 mg Oral BID   heparin   5,000 Units Subcutaneous Q8H   levothyroxine   25 mcg Oral Q0600   loratadine  10 mg Oral Daily   losartan   25 mg Oral Daily   pantoprazole   40 mg Oral Daily   senna-docusate  2 tablet Oral BID   sodium chloride  flush  3 mL Intravenous Q12H   traZODone   50 mg Oral QHS   Continuous Infusions:     LOS: 0 days   Time spent 35 minutes  Cresencio Fairly, MD [facility]   If 7PM-7AM, please contact night-coverage  07/23/2023, 5:29 PM

## 2023-07-24 DIAGNOSIS — R55 Syncope and collapse: Secondary | ICD-10-CM | POA: Diagnosis not present

## 2023-07-24 DIAGNOSIS — S60222A Contusion of left hand, initial encounter: Secondary | ICD-10-CM | POA: Diagnosis not present

## 2023-07-24 DIAGNOSIS — E1165 Type 2 diabetes mellitus with hyperglycemia: Secondary | ICD-10-CM | POA: Diagnosis not present

## 2023-07-24 DIAGNOSIS — S0083XA Contusion of other part of head, initial encounter: Secondary | ICD-10-CM | POA: Diagnosis not present

## 2023-07-24 LAB — CBC
HCT: 42 % (ref 36.0–46.0)
Hemoglobin: 14.6 g/dL (ref 12.0–15.0)
MCH: 31.1 pg (ref 26.0–34.0)
MCHC: 34.8 g/dL (ref 30.0–36.0)
MCV: 89.4 fL (ref 80.0–100.0)
Platelets: 215 K/uL (ref 150–400)
RBC: 4.7 MIL/uL (ref 3.87–5.11)
RDW: 12.6 % (ref 11.5–15.5)
WBC: 13.9 K/uL — ABNORMAL HIGH (ref 4.0–10.5)
nRBC: 0 % (ref 0.0–0.2)

## 2023-07-24 LAB — BASIC METABOLIC PANEL WITH GFR
Anion gap: 12 (ref 5–15)
BUN: 19 mg/dL (ref 8–23)
CO2: 24 mmol/L (ref 22–32)
Calcium: 9.5 mg/dL (ref 8.9–10.3)
Chloride: 100 mmol/L (ref 98–111)
Creatinine, Ser: 0.84 mg/dL (ref 0.44–1.00)
GFR, Estimated: >60 mL/min (ref >60)
Glucose, Bld: 141 mg/dL — ABNORMAL HIGH (ref 70–99)
Potassium: 3.5 mmol/L (ref 3.5–5.1)
Sodium: 136 mmol/L (ref 135–145)

## 2023-07-24 MED ORDER — NITROFURANTOIN MONOHYD MACRO 100 MG PO CAPS
100.0000 mg | ORAL_CAPSULE | Freq: Two times a day (BID) | ORAL | Status: AC
  Administered 2023-07-24 – 2023-07-26 (×6): 100 mg via ORAL
  Filled 2023-07-24 (×7): qty 1

## 2023-07-24 NOTE — Plan of Care (Signed)
  Problem: Education: Goal: Ability to describe self-care measures that may prevent or decrease complications (Diabetes Survival Skills Education) will improve Outcome: Progressing Goal: Individualized Educational Video(s) Outcome: Progressing   Problem: Coping: Goal: Ability to adjust to condition or change in health will improve Outcome: Progressing   Problem: Fluid Volume: Goal: Ability to maintain a balanced intake and output will improve Outcome: Progressing   Problem: Health Behavior/Discharge Planning: Goal: Ability to identify and utilize available resources and services will improve Outcome: Progressing Goal: Ability to manage health-related needs will improve Outcome: Progressing   Problem: Metabolic: Goal: Ability to maintain appropriate glucose levels will improve Outcome: Progressing   Problem: Nutritional: Goal: Maintenance of adequate nutrition will improve Outcome: Progressing Goal: Progress toward achieving an optimal weight will improve Outcome: Progressing   Problem: Skin Integrity: Goal: Risk for impaired skin integrity will decrease Outcome: Progressing   Problem: Tissue Perfusion: Goal: Adequacy of tissue perfusion will improve Outcome: Progressing   Problem: Education: Goal: Knowledge of condition and prescribed therapy will improve Outcome: Progressing   Problem: Cardiac: Goal: Will achieve and/or maintain adequate cardiac output Outcome: Progressing   Problem: Education: Goal: Knowledge of General Education information will improve Description: Including pain rating scale, medication(s)/side effects and non-pharmacologic comfort measures Outcome: Progressing   Problem: Physical Regulation: Goal: Complications related to the disease process, condition or treatment will be avoided or minimized Outcome: Progressing   Problem: Health Behavior/Discharge Planning: Goal: Ability to manage health-related needs will improve Outcome:  Progressing   Problem: Clinical Measurements: Goal: Ability to maintain clinical measurements within normal limits will improve Outcome: Progressing Goal: Will remain free from infection Outcome: Progressing Goal: Diagnostic test results will improve Outcome: Progressing Goal: Respiratory complications will improve Outcome: Progressing Goal: Cardiovascular complication will be avoided Outcome: Progressing   Problem: Activity: Goal: Risk for activity intolerance will decrease Outcome: Progressing   Problem: Nutrition: Goal: Adequate nutrition will be maintained Outcome: Progressing   Problem: Pain Managment: Goal: General experience of comfort will improve and/or be controlled Outcome: Progressing   Problem: Elimination: Goal: Will not experience complications related to bowel motility Outcome: Progressing Goal: Will not experience complications related to urinary retention Outcome: Progressing   Problem: Safety: Goal: Ability to remain free from injury will improve Outcome: Progressing

## 2023-07-24 NOTE — Progress Notes (Signed)
 Pt tx from 2A, A&OX4. BP 117/66 (BP Location: Right Arm)   Pulse 76   Temp 98.2 F (36.8 C) (Oral)   Resp 16   Ht 5' 6 (1.676 m)   Wt 82.7 kg   SpO2 99%   BMI 29.43 kg/m  Pt oriented to floor no further needs voiced at this time. Shannon Flores 07/24/23 6:01 PM

## 2023-07-24 NOTE — TOC Progression Note (Signed)
 Transition of Care [facility]) - Progression Note    Patient Details  Name: Shannon Flores MRN: [id number] Date of Birth: 01/16/1951  Transition [facility]) CM/SW Contact  Lauraine [name], LCSW Phone Number: 07/24/2023, 12:11 PM  Clinical Narrative:   Received call from [facility] Department. They are requesting AOR form so that they can review the clinicals that were faxed over yesterday. Patient signed this form yesterday in case they requested it. CSW faxed AOR form and yesterday's PT note.  Expected Discharge Plan and Services                                               Social Determinants of Health (SDOH) Interventions SDOH Screenings   Food Insecurity: No Food Insecurity (07/14/2023)  Recent Concern: Food Insecurity - Food Insecurity Present (04/29/2023)   Received from [facility]  Housing: Low Risk  (07/14/2023)  Transportation Needs: Unmet Transportation Needs (07/14/2023)  Utilities: Not At Risk (07/14/2023)  Financial Resource Strain: Medium Risk (04/29/2023)   Received from [facility]  Physical Activity: Inactive (04/29/2023)   Received from [facility]  Social Connections: Moderately Isolated (07/14/2023)  Stress: Stress Concern Present (04/29/2023)   Received from [facility]  Tobacco Use: Low Risk  (07/14/2023)  Health Literacy: Adequate Health Literacy (04/29/2023)   Received from [facility]    Readmission Risk Interventions     No data to display

## 2023-07-24 NOTE — Progress Notes (Signed)
 PROGRESS NOTE    Shannon Flores  FMW:[id number] DOB: [birth year] DOA: 07/14/2023 PCP: [name], MD    Brief Narrative:   73 year old female with history of GERD, hypothyroid, non-insulin -dependent diabetes mellitus, fibromyalgia, asthma, hypertension, who presents ED for chief concerns of a fall with multiple sites of bruising and syncope.   Patient reports that she was having some diarrhea and noted that she is on antibiotic so she needs to take her antibiotic with food.  At 3 AM this morning she remembered that she did not eat yet and that since she has been on antibiotic, she should get up and eat something.  She then made herself a peanut butter sandwich.  As she is eating, she felt the food getting stuck in her throat and she could not swallow and could not breathe.  She could not find her phone.  So she intended to run outside the door to come to her neighbors house for help.  In the next thing she remembers she was on the floor and felt bruised all over.  She then noted that there was the food bolus is laying next to her so she must have spit it up or vomited up the food.  She then called EMS.   Patient has bruising and tenderness all over. Patient is able to flex and extend into a gripping motion with her left hand at bedside.  7/2: I did peer to peer with her insurance but she is still declined for SNF by Payor. 7/3: had chest pain (anxiety related), checking UA as she's worried about UTI 7/4: Start Macrobid  for possible UTI, transfer to MedSurg   Assessment & Plan:   Principal Problem:   Syncope Active Problems:   History of asthma   History of anxiety   Ecchymosis of left eye   Ecchymosis of forearm   Ecchymosis of wrist   Traumatic ecchymosis of face   Traumatic ecchymosis of hand, left, initial encounter   Traumatic ecchymosis of hand, right, initial encounter   Chronic pain syndrome   Hypothyroidism   Uncontrolled type 2 diabetes mellitus with  hyperglycemia, without long-term current use of insulin  (HCC)   Recurrent major depressive disorder, in partial remission (HCC)   Prolonged QT syndrome   UTI (urinary tract infection)  Syncope Suspected postconcussion syndrome likely hypoxia in the setting of asphyxiation from fluid bolus.  Patient is on room air and tolerating p.o. however remains significantly ataxic with some element of volume depletion. - continues to be at high risk for falls. Was orthostatic. she completed 10 foot walk test in 76.4 seconds, which is an indication that pt is at increased risk of falls in the future.   Patient will benefit from placement in skilled nursing facility as next level of care.  I did peer to peer with her insurance on 7/2 but was declined. Patient/family has appealed - await decision Continue fall precautions and therapy evaluations TOC consulted for skilled nursing facility placement versus home health.  SNF appeal in process Had some chest pain episode on 7/3- likely anxiety/chronic pain related. EKG showed NSR, will get serial troponins.  History of anxiety Continue Home alprazolam    History of asthma No evidence of exacerbation.  Continue as needed nebulizer   Traumatic ecchymosis of hand, right, initial encounter Continue with sling in place   Traumatic ecchymosis of face Present on admission, supportive care   Recurrent major depressive disorder, in partial remission (HCC) Trazodone  50 mg nightly resumed  Uncontrolled type 2 diabetes mellitus with hyperglycemia, without long-term current use of insulin  (HCC) Oral agents.  SSI with bedtime coverage   Hypothyroidism Continue levothyroxine  25 mcg daily    Chronic pain syndrome PDMP reviewed Alprazolam  as above   UTI (urinary tract infection) Present on admission She is requesting Abx again - says she has UTI.   nitrofurantoin , 100 mg p.o. twice daily for 3 more days   Prolonged QT syndrome Monitor serum  magnesium    DVT prophylaxis: SQ heparin  Code Status: Full Family Communication: None at bedside Disposition Plan: Status is: Observation The patient will require care spanning > 2 midnights and should be moved to inpatient because: Ataxic gait.  Suspected postconcussive syndrome.  Unsafe discharge plan.  Will need skilled nurse facility placement.   Level of care: Telemetry Cardiac    Subjective: Seen and examined.  Denies any new issues.  Still hoping to find placement/SNF  Objective: Vitals:   07/24/23 0344 07/24/23 0500 07/24/23 0748 07/24/23 1151  BP: 112/70  (!) 115/58 115/69  Pulse: 95  86 79  Resp: 16  14   Temp: 98.8 F (37.1 C)  98.6 F (37 C) 98.6 F (37 C)  TempSrc:      SpO2: 93%  92% 95%  Weight:  82.7 kg    Height:       No intake or output data in the 24 hours ending 07/24/23 1356   Filed Weights   07/22/23 0511 07/23/23 0535 07/24/23 0500  Weight: 84 kg 85.6 kg 82.7 kg    Examination:  General exam: NAD.  Fatigued Respiratory system: Lungs clear.  No work of breathing.  Room air Cardiovascular system: S1-S2, RRR, no murmurs, no pedal edema Gastrointestinal system: Soft, NT/ND, normal bowel sounds Central nervous system: Alert and oriented. No focal neurological deficits. Extremities: Symmetric 5 x 5 power. Skin: Scattered ecchymoses on face and torso Psychiatry: Judgement and insight appear normal. Mood & affect appropriate.     Data Reviewed: I have personally reviewed following labs and imaging studies  CBC: Recent Labs  Lab 07/23/23 0533 07/24/23 0425  WBC 7.3 13.9*  HGB 14.3 14.6  HCT 42.6 42.0  MCV 91.4 89.4  PLT 194 215   Basic Metabolic Panel: Recent Labs  Lab 07/23/23 0533 07/24/23 0425  NA 138 136  K 4.0 3.5  CL 101 100  CO2 25 24  GLUCOSE 121* 141*  BUN 21 19  CREATININE 0.82 0.84  CALCIUM 9.4 9.5     CBG: Recent Labs  Lab 07/18/23 1214 07/18/23 1611 07/18/23 2116 07/19/23 0735 07/19/23 1132  GLUCAP  176* 171* 130* 107* 210*    Scheduled Meds:  aspirin  EC  81 mg Oral Daily   atenolol   100 mg Oral Daily   famotidine   20 mg Oral BID   heparin   5,000 Units Subcutaneous Q8H   levothyroxine   25 mcg Oral Q0600   loratadine   10 mg Oral Daily   losartan   25 mg Oral Daily   pantoprazole   40 mg Oral Daily   senna-docusate  2 tablet Oral BID   sodium chloride  flush  3 mL Intravenous Q12H   traZODone   50 mg Oral QHS     LOS: 0 days   Time spent 35 minutes  Cresencio Fairly, MD [facility]   If 7PM-7AM, please contact night-coverage  07/24/2023, 1:56 PM

## 2023-07-24 NOTE — Progress Notes (Signed)
 Mobility Specialist - Progress Note     07/24/23 1500  Mobility  Activity Ambulated with assistance in hallway  Level of Assistance Contact guard assist, steadying assist  Assistive Device Front wheel walker  Distance Ambulated (ft) 60 ft  Range of Motion/Exercises Active  Activity Response Tolerated well  Mobility Referral Yes  Mobility visit 1 Mobility  Mobility Specialist Start Time (ACUTE ONLY) 1500  Mobility Specialist Stop Time (ACUTE ONLY) 1524  Mobility Specialist Time Calculation (min) (ACUTE ONLY) 24 min   Pt resting in bed on RA upon entry. Pt STS MinA and ambulates to hallway around NS CGA slow gait due to caution of falling. Pt endorses cramping in side on way back to room. Pt returned to bed and left EOB with needs in reach. RN present at bedside.   Guido Rumble Mobility Specialist 07/24/23, 4:22 PM

## 2023-07-24 NOTE — Plan of Care (Signed)
  Problem: Education: Goal: Ability to describe self-care measures that may prevent or decrease complications (Diabetes Survival Skills Education) will improve Outcome: Progressing Goal: Individualized Educational Video(s) Outcome: Progressing   Problem: Coping: Goal: Ability to adjust to condition or change in health will improve Outcome: Progressing   Problem: Fluid Volume: Goal: Ability to maintain a balanced intake and output will improve Outcome: Progressing   Problem: Health Behavior/Discharge Planning: Goal: Ability to identify and utilize available resources and services will improve Outcome: Progressing Goal: Ability to manage health-related needs will improve Outcome: Progressing   Problem: Metabolic: Goal: Ability to maintain appropriate glucose levels will improve Outcome: Progressing   Problem: Nutritional: Goal: Maintenance of adequate nutrition will improve Outcome: Progressing Goal: Progress toward achieving an optimal weight will improve Outcome: Progressing   Problem: Skin Integrity: Goal: Risk for impaired skin integrity will decrease Outcome: Progressing   Problem: Tissue Perfusion: Goal: Adequacy of tissue perfusion will improve Outcome: Progressing   Problem: Education: Goal: Knowledge of condition and prescribed therapy will improve Outcome: Progressing   Problem: Cardiac: Goal: Will achieve and/or maintain adequate cardiac output Outcome: Progressing   Problem: Physical Regulation: Goal: Complications related to the disease process, condition or treatment will be avoided or minimized Outcome: Progressing   Problem: Education: Goal: Knowledge of General Education information will improve Description: Including pain rating scale, medication(s)/side effects and non-pharmacologic comfort measures Outcome: Progressing   Problem: Health Behavior/Discharge Planning: Goal: Ability to manage health-related needs will improve Outcome:  Progressing   Problem: Clinical Measurements: Goal: Ability to maintain clinical measurements within normal limits will improve Outcome: Progressing Goal: Will remain free from infection Outcome: Progressing Goal: Diagnostic test results will improve Outcome: Progressing Goal: Respiratory complications will improve Outcome: Progressing Goal: Cardiovascular complication will be avoided Outcome: Progressing   Problem: Activity: Goal: Risk for activity intolerance will decrease Outcome: Progressing   Problem: Nutrition: Goal: Adequate nutrition will be maintained Outcome: Progressing   Problem: Coping: Goal: Level of anxiety will decrease Outcome: Progressing   Problem: Elimination: Goal: Will not experience complications related to bowel motility Outcome: Progressing Goal: Will not experience complications related to urinary retention Outcome: Progressing   Problem: Pain Managment: Goal: General experience of comfort will improve and/or be controlled Outcome: Progressing   Problem: Safety: Goal: Ability to remain free from injury will improve Outcome: Progressing   Problem: Skin Integrity: Goal: Risk for impaired skin integrity will decrease Outcome: Progressing

## 2023-07-25 DIAGNOSIS — Z8659 Personal history of other mental and behavioral disorders: Secondary | ICD-10-CM | POA: Diagnosis not present

## 2023-07-25 DIAGNOSIS — S0083XA Contusion of other part of head, initial encounter: Secondary | ICD-10-CM | POA: Diagnosis not present

## 2023-07-25 DIAGNOSIS — R55 Syncope and collapse: Secondary | ICD-10-CM | POA: Diagnosis not present

## 2023-07-25 DIAGNOSIS — Z8709 Personal history of other diseases of the respiratory system: Secondary | ICD-10-CM | POA: Diagnosis not present

## 2023-07-25 NOTE — Progress Notes (Signed)
 PROGRESS NOTE    Shannon Flores  FMW:[id number] DOB: [birth year] DOA: 07/14/2023 PCP: [name], MD    Brief Narrative:   73 year old female with history of GERD, hypothyroid, non-insulin -dependent diabetes mellitus, fibromyalgia, asthma, hypertension, who presents ED for chief concerns of a fall with multiple sites of bruising and syncope.   Patient reports that she was having some diarrhea and noted that she is on antibiotic so she needs to take her antibiotic with food.  At 3 AM this morning she remembered that she did not eat yet and that since she has been on antibiotic, she should get up and eat something.  She then made herself a peanut butter sandwich.  As she is eating, she felt the food getting stuck in her throat and she could not swallow and could not breathe.  She could not find her phone.  So she intended to run outside the door to come to her neighbors house for help.  In the next thing she remembers she was on the floor and felt bruised all over.  She then noted that there was the food bolus is laying next to her so she must have spit it up or vomited up the food.  She then called EMS.   Patient has bruising and tenderness all over. Patient is able to flex and extend into a gripping motion with her left hand at bedside.  7/2: I did peer to peer with her insurance but she is still declined for SNF by Payor. 7/3: had chest pain (anxiety related), checking UA as she's worried about UTI 7/4: Start Macrobid  for possible UTI, transfer to MedSurg 7/5: Medically stable waiting on appeal decision  Assessment & Plan:   Principal Problem:   Syncope Active Problems:   History of asthma   History of anxiety   Ecchymosis of left eye   Ecchymosis of forearm   Ecchymosis of wrist   Traumatic ecchymosis of face   Traumatic ecchymosis of hand, left, initial encounter   Traumatic ecchymosis of hand, right, initial encounter   Chronic pain syndrome   Hypothyroidism    Uncontrolled type 2 diabetes mellitus with hyperglycemia, without long-term current use of insulin  (HCC)   Recurrent major depressive disorder, in partial remission (HCC)   Prolonged QT syndrome   UTI (urinary tract infection)  Syncope Suspected postconcussion syndrome likely hypoxia in the setting of asphyxiation from fluid bolus.  Patient is on room air and tolerating p.o. however remains significantly ataxic with some element of volume depletion. - continues to be at high risk for falls. Was orthostatic. she completed 10 foot walk test in 76.4 seconds, which is an indication that pt is at increased risk of falls in the future.   Patient will benefit from placement in skilled nursing facility as next level of care.  I did peer to peer with her insurance on 7/2 but was declined. Patient/family has appealed - await decision TOC consulted for skilled nursing facility placement versus home health.  SNF appeal in process.  Waiting on decision Had some chest pain episode on 7/3- likely anxiety/chronic pain related. EKG showed NSR, will get serial troponins.  History of anxiety Continue Home alprazolam    History of asthma No evidence of exacerbation.  Continue as needed nebulizer   Traumatic ecchymosis of hand, right, initial encounter Continue with sling in place   Traumatic ecchymosis of face Present on admission, supportive care   Recurrent major depressive disorder, in partial remission (HCC) Trazodone  50 mg  nightly resumed   Uncontrolled type 2 diabetes mellitus with hyperglycemia, without long-term current use of insulin  (HCC) Oral agents.  SSI with bedtime coverage   Hypothyroidism Continue levothyroxine  25 mcg daily    Chronic pain syndrome PDMP reviewed Alprazolam  as above   UTI (urinary tract infection) Present on admission She is requesting Abx again - says she has UTI.   nitrofurantoin , 100 mg p.o. twice daily for 2 more days   Prolonged QT syndrome Monitor serum  magnesium    DVT prophylaxis: SQ heparin  Code Status: Full Family Communication: None at bedside Disposition Plan: Status is: Observation The patient will require care spanning > 2 midnights and should be moved to inpatient because: Ataxic gait.  Suspected postconcussive syndrome.  Unsafe discharge plan.  Will need skilled nurse facility placement.   Level of care: Med-Surg    Subjective: Seen and examined.  Denies any new issues.  Still hoping to find placement/SNF.  Patient/family has appealed  Objective: Vitals:   07/24/23 1743 07/24/23 1933 07/25/23 0413 07/25/23 0758  BP: 117/66 119/79 121/72 119/79  Pulse: 76 81 77 75  Resp: 16 16 16 16   Temp: 98.2 F (36.8 C) (!) 96.9 F (36.1 C) 97.8 F (36.6 C) 98.1 F (36.7 C)  TempSrc: Oral Axillary Oral   SpO2: 99% 99% 97% 92%  Weight:   80.8 kg   Height:        Intake/Output Summary (Last 24 hours) at 07/25/2023 1301 Last data filed at 07/25/2023 1009 Gross per 24 hour  Intake 0 ml  Output --  Net 0 ml     Filed Weights   07/23/23 0535 07/24/23 0500 07/25/23 0413  Weight: 85.6 kg 82.7 kg 80.8 kg    Examination:  General exam: NAD.  Fatigued Respiratory system: Lungs clear.  No work of breathing.  Room air Cardiovascular system: S1-S2, RRR, no murmurs, no pedal edema Gastrointestinal system: Soft, NT/ND, normal bowel sounds Central nervous system: Alert and oriented. No focal neurological deficits. Extremities: Symmetric 5 x 5 power. Skin: Scattered ecchymoses on face and torso Psychiatry: Judgement and insight appear normal. Mood & affect appropriate.     Data Reviewed: I have personally reviewed following labs and imaging studies  CBC: Recent Labs  Lab 07/23/23 0533 07/24/23 0425  WBC 7.3 13.9*  HGB 14.3 14.6  HCT 42.6 42.0  MCV 91.4 89.4  PLT 194 215   Basic Metabolic Panel: Recent Labs  Lab 07/23/23 0533 07/24/23 0425  NA 138 136  K 4.0 3.5  CL 101 100  CO2 25 24  GLUCOSE 121* 141*  BUN  21 19  CREATININE 0.82 0.84  CALCIUM 9.4 9.5     CBG: Recent Labs  Lab 07/18/23 1611 07/18/23 2116 07/19/23 0735 07/19/23 1132  GLUCAP 171* 130* 107* 210*    Scheduled Meds:  aspirin  EC  81 mg Oral Daily   atenolol   100 mg Oral Daily   famotidine   20 mg Oral BID   heparin   5,000 Units Subcutaneous Q8H   levothyroxine   25 mcg Oral Q0600   loratadine   10 mg Oral Daily   losartan   25 mg Oral Daily   nitrofurantoin  (macrocrystal-monohydrate)  100 mg Oral Q12H   pantoprazole   40 mg Oral Daily   senna-docusate  2 tablet Oral BID   sodium chloride  flush  3 mL Intravenous Q12H   traZODone   50 mg Oral QHS     LOS: 0 days   Time spent 35 minutes  Cresencio Fairly, MD  [facility]   If 7PM-7AM, please contact night-coverage  07/25/2023, 1:01 PM

## 2023-07-26 DIAGNOSIS — Z8709 Personal history of other diseases of the respiratory system: Secondary | ICD-10-CM | POA: Diagnosis not present

## 2023-07-26 DIAGNOSIS — S60222A Contusion of left hand, initial encounter: Secondary | ICD-10-CM | POA: Diagnosis not present

## 2023-07-26 DIAGNOSIS — R55 Syncope and collapse: Secondary | ICD-10-CM | POA: Diagnosis not present

## 2023-07-26 DIAGNOSIS — Z8659 Personal history of other mental and behavioral disorders: Secondary | ICD-10-CM | POA: Diagnosis not present

## 2023-07-26 MED ORDER — NYSTATIN 100000 UNIT/GM EX CREA
TOPICAL_CREAM | Freq: Two times a day (BID) | CUTANEOUS | Status: DC
  Filled 2023-07-26: qty 30

## 2023-07-26 NOTE — Progress Notes (Signed)
 Physical Therapy Treatment Patient Details Name: Shannon Flores MRN: [id number] DOB: [birth year] Today's Date: 07/26/2023   History of Present Illness 73 year old female with history of GERD, hypothyroid, non-insulin -dependent diabetes mellitus, fibromyalgia, asthma, hypertension, who presents ED for chief concerns of a fall with multiple sites of bruising and syncope    PT Comments  Pt ready for session.  To EOB with rails and time.  She is able to stand and progress gait distance today.  Gait remains unsteady with high fall risk but no formal LOB's noted.  She does c/o dizziness upon turning back to room but does feel it may be related to oxy she recently took.  <3 hours of therapy remains appropriate upon discharge.   If plan is discharge home, recommend the following: A little help with walking and/or transfers;A little help with bathing/dressing/bathroom;Assist for transportation;Help with stairs or ramp for entrance;Assistance with cooking/housework   Can travel by private vehicle        Equipment Recommendations  None recommended by PT    Recommendations for Other Services       Precautions / Restrictions Precautions Precautions: Fall Recall of Precautions/Restrictions: Intact Restrictions Weight Bearing Restrictions Per Provider Order: No     Mobility  Bed Mobility Overal bed mobility: Needs Assistance Bed Mobility: Supine to Sit, Sit to Supine     Supine to sit: Modified independent (Device/Increase time), Used rails Sit to supine: Supervision, Used rails     Patient Response: Cooperative  Transfers Overall transfer level: Needs assistance Equipment used: Rolling walker (2 wheels) Transfers: Sit to/from Stand Sit to Stand: Contact guard assist           General transfer comment: cues for hand placements    Ambulation/Gait Ambulation/Gait assistance: Contact guard assist, Min assist Gait Distance (Feet): 100 Feet Assistive device: Rolling walker (2  wheels) Gait Pattern/deviations: Step-through pattern, Decreased step length - right, Decreased step length - left Gait velocity: decreased     General Gait Details: slow gait with imbalances noted and c/o dizziness upon return to room.  will need +1 hands on assist for mobility.   Stairs             Wheelchair Mobility     Tilt Bed Tilt Bed Patient Response: Cooperative  Modified Rankin (Stroke Patients Only)       Balance Overall balance assessment: Needs assistance Sitting-balance support: Feet supported, No upper extremity supported Sitting balance-Leahy Scale: Good     Standing balance support: During functional activity, No upper extremity supported, Reliant on assistive device for balance Standing balance-Leahy Scale: Fair Standing balance comment: Pt heavily reliant on RW to maintain balance in stance and ambulation, but remained steady throughout and no LOBs occured                            Communication Communication Communication: Impaired Factors Affecting Communication: Hearing impaired  Cognition Arousal: Alert Behavior During Therapy: WFL for tasks assessed/performed   PT - Cognitive impairments: No apparent impairments                         Following commands: Intact      Cueing Cueing Techniques: Verbal cues, Gestural cues  Exercises      General Comments        Pertinent Vitals/Pain Pain Assessment Pain Assessment: Faces Faces Pain Scale: Hurts a little bit Pain Location: Low back Pain Descriptors / Indicators: Constant,  Discomfort Pain Intervention(s): Monitored during session, Limited activity within patient's tolerance, Repositioned    Home Living                          Prior Function            PT Goals (current goals can now be found in the care plan section) Progress towards PT goals: Progressing toward goals    Frequency    Min 2X/week      PT Plan      Co-evaluation               AM-PAC PT 6 Clicks Mobility   Outcome Measure  Help needed turning from your back to your side while in a flat bed without using bedrails?: A Little Help needed moving from lying on your back to sitting on the side of a flat bed without using bedrails?: A Little Help needed moving to and from a bed to a chair (including a wheelchair)?: A Little Help needed standing up from a chair using your arms (e.g., wheelchair or bedside chair)?: A Little Help needed to walk in hospital room?: A Little Help needed climbing 3-5 steps with a railing? : A Lot 6 Click Score: 17    End of Session Equipment Utilized During Treatment: Gait belt Activity Tolerance: Patient tolerated treatment well   Nurse Communication: Mobility status;Other (comment) PT Visit Diagnosis: Other abnormalities of gait and mobility (R26.89);Difficulty in walking, not elsewhere classified (R26.2);Muscle weakness (generalized) (M62.81)     Time: 8494-8479 PT Time Calculation (min) (ACUTE ONLY): 15 min  Charges:    $Gait Training: 8-22 mins PT General Charges $$ ACUTE PT VISIT: 1 Visit                   Lauraine Gills, PTA 07/26/23, 3:28 PM

## 2023-07-26 NOTE — Progress Notes (Addendum)
 PROGRESS NOTE    Shannon Flores  FMW:[id number] DOB: [birth year] DOA: 07/14/2023 PCP: [name], MD    Brief Narrative:   73 year old female with history of GERD, hypothyroid, non-insulin -dependent diabetes mellitus, fibromyalgia, asthma, hypertension, who presents ED for chief concerns of a fall with multiple sites of bruising and syncope.   Patient reports that she was having some diarrhea and noted that she is on antibiotic so she needs to take her antibiotic with food.  At 3 AM this morning she remembered that she did not eat yet and that since she has been on antibiotic, she should get up and eat something.  She then made herself a peanut butter sandwich.  As she is eating, she felt the food getting stuck in her throat and she could not swallow and could not breathe.  She could not find her phone.  So she intended to run outside the door to come to her neighbors house for help.  In the next thing she remembers she was on the floor and felt bruised all over.  She then noted that there was the food bolus is laying next to her so she must have spit it up or vomited up the food.  She then called EMS.   Patient has bruising and tenderness all over. Patient is able to flex and extend into a gripping motion with her left hand at bedside.  7/2: I did peer to peer with her insurance but she is still declined for SNF by Payor. 7/3: had chest pain (anxiety related), checking UA as she's worried about UTI 7/4: Start Macrobid  for possible UTI, transfer to MedSurg 7/5-7/6: Medically stable waiting on appeal decision  Assessment & Plan:   Principal Problem:   Syncope Active Problems:   History of asthma   History of anxiety   Ecchymosis of left eye   Ecchymosis of forearm   Ecchymosis of wrist   Traumatic ecchymosis of face   Traumatic ecchymosis of hand, left, initial encounter   Traumatic ecchymosis of hand, right, initial encounter   Chronic pain syndrome   Hypothyroidism    Uncontrolled type 2 diabetes mellitus with hyperglycemia, without long-term current use of insulin  (HCC)   Recurrent major depressive disorder, in partial remission (HCC)   Prolonged QT syndrome   UTI (urinary tract infection)  Syncope Suspected postconcussion syndrome likely hypoxia in the setting of asphyxiation from fluid bolus.  Patient is on room air and tolerating p.o. however remains significantly ataxic with some element of volume depletion. - continues to be at high risk for falls. Was orthostatic. she completed 10 foot walk test in 76.4 seconds, which is an indication that pt is at increased risk of falls in the future.   Patient will benefit from placement in skilled nursing facility as next level of care.  I did peer to peer with her insurance on 7/2 but was declined. Patient/family has appealed - await decision TOC consulted for skilled nursing facility placement versus home health.  SNF appeal in process.  Waiting on decision  History of anxiety Continue Home alprazolam    History of asthma No evidence of exacerbation.  Continue as needed nebulizer   Traumatic ecchymosis of hand, right, initial encounter Continue with sling in place   Traumatic ecchymosis of face Present on admission, supportive care   Recurrent major depressive disorder, in partial remission (HCC) Trazodone  50 mg nightly resumed   Uncontrolled type 2 diabetes mellitus with hyperglycemia, without long-term current use of insulin  (HCC)  Oral agents.  SSI with bedtime coverage   Hypothyroidism Continue levothyroxine  25 mcg daily    Chronic pain syndrome PDMP reviewed Alprazolam  as above   UTI (urinary tract infection) Present on admission She is requesting Abx again - says she has UTI.   nitrofurantoin , 100 mg p.o. twice daily treated for 3 days.  I will stop antibiotics after today   Prolonged QT syndrome Monitor serum magnesium    DVT prophylaxis: SQ heparin  Code Status: Full Family  Communication: None at bedside Disposition Plan: Status is: Observation The patient will require care spanning > 2 midnights and should be moved to inpatient because: Ataxic gait.  Suspected postconcussive syndrome.  Unsafe discharge plan.  Will need skilled nurse facility placement.   Level of care: Med-Surg    Subjective: Seen and examined.  She seems frustrated and feels we are not doing enough to fight with insurance to get her to skilled nursing facility  Objective: Vitals:   07/25/23 1610 07/25/23 2053 07/26/23 0402 07/26/23 0500  BP: 115/79 112/60 111/71   Pulse: 75 78 74   Resp: 18 20 18    Temp: 98 F (36.7 C) 98.2 F (36.8 C) 98 F (36.7 C)   TempSrc: Oral  Oral   SpO2: 95% 97% 94%   Weight:    80.6 kg  Height:       No intake or output data in the 24 hours ending 07/26/23 1143    Filed Weights   07/24/23 0500 07/25/23 0413 07/26/23 0500  Weight: 82.7 kg 80.8 kg 80.6 kg    Examination:  General exam: NAD.  Fatigued Respiratory system: Lungs clear.  No work of breathing.  Room air Cardiovascular system: S1-S2, RRR, no murmurs, no pedal edema Gastrointestinal system: Soft, NT/ND, normal bowel sounds Central nervous system: Alert and oriented. No focal neurological deficits. Extremities: Symmetric 5 x 5 power. Skin: Scattered ecchymoses on face and torso Psychiatry: Judgement and insight appear normal. Mood & affect appropriate.     Data Reviewed: I have personally reviewed following labs and imaging studies  CBC: Recent Labs  Lab 07/23/23 0533 07/24/23 0425  WBC 7.3 13.9*  HGB 14.3 14.6  HCT 42.6 42.0  MCV 91.4 89.4  PLT 194 215   Basic Metabolic Panel: Recent Labs  Lab 07/23/23 0533 07/24/23 0425  NA 138 136  K 4.0 3.5  CL 101 100  CO2 25 24  GLUCOSE 121* 141*  BUN 21 19  CREATININE 0.82 0.84  CALCIUM 9.4 9.5     CBG: No results for input(s): GLUCAP in the last 168 hours.   Scheduled Meds:  aspirin  EC  81 mg Oral Daily    atenolol   100 mg Oral Daily   famotidine   20 mg Oral BID   heparin   5,000 Units Subcutaneous Q8H   levothyroxine   25 mcg Oral Q0600   loratadine   10 mg Oral Daily   losartan   25 mg Oral Daily   nitrofurantoin  (macrocrystal-monohydrate)  100 mg Oral Q12H   pantoprazole   40 mg Oral Daily   senna-docusate  2 tablet Oral BID   traZODone   50 mg Oral QHS     LOS: 0 days   Time spent 35 minutes  Cresencio Fairly, MD [facility]   If 7PM-7AM, please contact night-coverage  07/26/2023, 11:43 AM

## 2023-07-26 NOTE — Plan of Care (Signed)
   Problem: Education: Goal: Ability to describe self-care measures that may prevent or decrease complications (Diabetes Survival Skills Education) will improve Outcome: Progressing

## 2023-07-27 DIAGNOSIS — F3341 Major depressive disorder, recurrent, in partial remission: Secondary | ICD-10-CM | POA: Diagnosis not present

## 2023-07-27 DIAGNOSIS — S0083XA Contusion of other part of head, initial encounter: Secondary | ICD-10-CM | POA: Diagnosis not present

## 2023-07-27 DIAGNOSIS — Z8709 Personal history of other diseases of the respiratory system: Secondary | ICD-10-CM | POA: Diagnosis not present

## 2023-07-27 DIAGNOSIS — R55 Syncope and collapse: Secondary | ICD-10-CM | POA: Diagnosis not present

## 2023-07-27 MED ORDER — SENNOSIDES-DOCUSATE SODIUM 8.6-50 MG PO TABS: 2.0000 | ORAL_TABLET | Freq: Two times a day (BID) | ORAL | Status: AC

## 2023-07-27 MED ORDER — ALPRAZOLAM 0.5 MG PO TABS: 0.5000 mg | ORAL_TABLET | Freq: Every day | ORAL | 0 refills | Status: AC | PRN

## 2023-07-27 NOTE — TOC Transition Note (Addendum)
 Transition of Care [facility]) - Discharge Note   Patient Details  Name: Shannon Flores MRN: [id number] Date of Birth: Sep 18, 1950  [facility] CM/SW Contact:  Lauraine [name], LCSW Phone Number: 07/27/2023, 12:07 PM   Clinical Narrative: Patient has orders to discharge to [facility]. RN will call report to [phone] (Room 127B). Son-in-law will pick her up this afternoon. No further concerns. CSW signing off.    12:56 pm: Son-in-law Vinie Eva Held will pick patient up around 3:45 and will call the RN about 15 minutes prior to arrival. RN is aware.  Final next level of care: Skilled Nursing Facility Barriers to Discharge: Barriers Resolved   Patient Goals and CMS Choice            Discharge Placement   Existing PASRR number confirmed : 07/27/23          Patient chooses bed at: [facility] Patient to be transferred to facility by: Son-in-law Name of family member notified: Damien Height Patient and family notified of of transfer: 07/27/23  Discharge Plan and Services Additional resources added to the After Visit Summary for                                       Social Drivers of Health (SDOH) Interventions SDOH Screenings   Food Insecurity: No Food Insecurity (07/14/2023)  Recent Concern: Food Insecurity - Food Insecurity Present (04/29/2023)   Received from [facility]  Housing: Low Risk  (07/14/2023)  Transportation Needs: Unmet Transportation Needs (07/14/2023)  Utilities: Not At Risk (07/14/2023)  Financial Resource Strain: Medium Risk (04/29/2023)   Received from [facility]  Physical Activity: Inactive (04/29/2023)   Received [facility]  Social Connections: Moderately Isolated (07/14/2023)  Stress: Stress Concern Present (04/29/2023)   [facility]  Tobacco Use: Low Risk  (07/14/2023)  Health Literacy: Adequate Health Literacy  (04/29/2023)   Received from [facility]     Readmission Risk Interventions     No data to display

## 2023-07-27 NOTE — Plan of Care (Signed)
  Problem: Education: Goal: Ability to describe self-care measures that may prevent or decrease complications (Diabetes Survival Skills Education) will improve Outcome: Progressing Goal: Individualized Educational Video(s) Outcome: Progressing   Problem: Coping: Goal: Ability to adjust to condition or change in health will improve Outcome: Progressing   Problem: Fluid Volume: Goal: Ability to maintain a balanced intake and output will improve Outcome: Progressing   Problem: Health Behavior/Discharge Planning: Goal: Ability to identify and utilize available resources and services will improve Outcome: Progressing Goal: Ability to manage health-related needs will improve Outcome: Progressing   Problem: Metabolic: Goal: Ability to maintain appropriate glucose levels will improve Outcome: Progressing   Problem: Nutritional: Goal: Maintenance of adequate nutrition will improve Outcome: Progressing Goal: Progress toward achieving an optimal weight will improve Outcome: Progressing   Problem: Tissue Perfusion: Goal: Adequacy of tissue perfusion will improve Outcome: Progressing   Problem: Skin Integrity: Goal: Risk for impaired skin integrity will decrease Outcome: Progressing   Problem: Education: Goal: Knowledge of condition and prescribed therapy will improve Outcome: Progressing   Problem: Cardiac: Goal: Will achieve and/or maintain adequate cardiac output Outcome: Progressing   Problem: Physical Regulation: Goal: Complications related to the disease process, condition or treatment will be avoided or minimized Outcome: Progressing   Problem: Health Behavior/Discharge Planning: Goal: Ability to manage health-related needs will improve Outcome: Progressing   Problem: Education: Goal: Knowledge of General Education information will improve Description: Including pain rating scale, medication(s)/side effects and non-pharmacologic comfort measures Outcome:  Progressing

## 2023-07-27 NOTE — Discharge Summary (Signed)
 Physician Discharge Summary   Patient: Shannon Flores MRN: [id number] DOB: 01-Oct-1950  Admit date:     07/14/2023  Discharge date: 07/27/23  Discharge Physician: Cresencio Fairly   PCP: [name], MD   Recommendations at discharge:    F/up with outpt providers as requested  Discharge Diagnoses: Principal Problem:   Syncope Active Problems:   History of asthma   History of anxiety   Ecchymosis of left eye   Ecchymosis of forearm   Ecchymosis of wrist   Traumatic ecchymosis of face   Traumatic ecchymosis of hand, left, initial encounter   Traumatic ecchymosis of hand, right, initial encounter   Chronic pain syndrome   Hypothyroidism   Uncontrolled type 2 diabetes mellitus with hyperglycemia, without long-term current use of insulin  (HCC)   Recurrent major depressive disorder, in partial remission (HCC)   Prolonged QT syndrome   UTI (urinary tract infection)  Hospital Course: Assessment and Plan:  73 year old female with history of GERD, hypothyroid, non-insulin -dependent diabetes mellitus, fibromyalgia, asthma, hypertension, who presents ED for chief concerns of a fall with multiple sites of bruising and syncope.    Patient reports that she was having some diarrhea and noted that she is on antibiotic so she needs to take her antibiotic with food.  At 3 AM this morning she remembered that she did not eat yet and that since she has been on antibiotic, she should get up and eat something.  She then made herself a peanut butter sandwich.  As she is eating, she felt the food getting stuck in her throat and she could not swallow and could not breathe.  She could not find her phone.  So she intended to run outside the door to come to her neighbors house for help.  In the next thing she remembers she was on the floor and felt bruised all over.  She then noted that there was the food bolus is laying next to her so she must have spit it up or vomited up the food.  She then called EMS.    Patient has bruising and tenderness all over. Patient is able to flex and extend into a gripping motion with her left hand at bedside.   7/2: I did peer to peer with her insurance but she is still declined for SNF by Payor. 7/3: had chest pain (anxiety related), checking UA as she's worried about UTI 7/4: Start Macrobid  for possible UTI, transfer to MedSurg 7/5-7/6: Medically stable waiting on appeal decision   Assessment & Plan:   Syncope Suspected postconcussion syndrome likely hypoxia in the setting of asphyxiation from fluid bolus.  Patient is on room air and tolerating p.o. however remains significantly ataxic with some element of volume depletion. - continues to be at high risk for falls. Was orthostatic.  Avoid Narcotics.   History of anxiety Continue Home alprazolam    History of asthma No evidence of exacerbation.  Continue as needed nebulizer   Traumatic ecchymosis of hand, right, initial encounter Continue with sling in place   Traumatic ecchymosis of face Present on admission, supportive care   Recurrent major depressive disorder, in partial remission (HCC) Trazodone  50 mg nightly resumed   Uncontrolled type 2 diabetes mellitus with hyperglycemia, without long-term current use of insulin  (HCC) Hypothyroidism Continue levothyroxine  25 mcg daily    Chronic pain syndrome Alprazolam  as above   UTI (urinary tract infection) Present on admission She is requesting Abx again - says she has UTI.   nitrofurantoin , 100  mg p.o. twice daily treated for 3 days.   Prolonged QT syndrome Monitor serum magnesium          Disposition: Skilled nursing facility Diet recommendation:  Discharge Diet Orders (From admission, onward)     Start     Ordered   07/27/23 0000  Diet - low sodium heart healthy        07/27/23 1109           Carb modified diet DISCHARGE MEDICATION: Allergies as of 07/27/2023       Reactions   Propoxyphene Other (See Comments)   can't  remember   Statins Other (See Comments)   unspecified unspecified Unknown reaction   Benadryl [diphenhydramine Hcl] Hives   Diflucan [fluconazole, Injectable]    Fluconazole Diarrhea   unspecified   Imitrex [sumatriptan Base] Other (See Comments)   Tachycardia   Iodine Hives   IV Dye, Iodine containing contrast media group   Iohexol    Metformin Diarrhea   Naprelan [naproxen Sodium]    rash   Provera [medroxyprogesterone Acetate]    Prozac [fluoxetine Hcl]    Amoxicillin-pot Clavulanate Rash   Ceclor [cefaclor] Rash   Clindamycin Rash, Hives   Epinephrine Palpitations   Erythromycin Rash   Sulfa Antibiotics Rash   Tetracyclines & Related Rash        Medication List     STOP taking these medications    cyclobenzaprine 10 MG tablet Commonly known as: FLEXERIL   diphenhydrAMINE 25 MG tablet Commonly known as: BENADRYL   gabapentin  100 MG capsule Commonly known as: NEURONTIN    nitrofurantoin  (macrocrystal-monohydrate) 100 MG capsule Commonly known as: MACROBID        TAKE these medications    albuterol  108 (90 Base) MCG/ACT inhaler Commonly known as: VENTOLIN  HFA Inhale 2 puffs into the lungs every 6 (six) hours as needed.   ALPRAZolam  0.5 MG tablet Commonly known as: XANAX  Take 1 tablet (0.5 mg total) by mouth daily as needed for up to 3 days.   aspirin  EC 81 MG tablet Take 81 mg by mouth daily.   atenolol  100 MG tablet Commonly known as: TENORMIN  Take 1.5 tablets by mouth daily.   canagliflozin 300 MG Tabs tablet Commonly known as: INVOKANA Take 300 mg by mouth daily before breakfast.   Gemtesa  75 MG Tabs Generic drug: Vibegron  Take 1 tablet (75 mg total) by mouth daily.   ketoconazole 2 % cream Commonly known as: NIZORAL APPLY TOPICALLY 2 TIMES DAILY AS NEEDED.   levothyroxine  50 MCG tablet Commonly known as: SYNTHROID  Take 50 mcg by mouth daily.   losartan  25 MG tablet Commonly known as: COZAAR  Take by mouth.   montelukast 10 MG  tablet Commonly known as: SINGULAIR Take by mouth.   mupirocin ointment 2 % Commonly known as: BACTROBAN Apply topically.   nystatin  cream Commonly known as: MYCOSTATIN  Apply 1 application. topically 2 (two) times daily.   RABEprazole 20 MG tablet Commonly known as: ACIPHEX Take 20 mg by mouth daily.   senna-docusate 8.6-50 MG tablet Commonly known as: Senokot-S Take 2 tablets by mouth 2 (two) times daily.   traZODone  50 MG tablet Commonly known as: DESYREL  Take 1 tablet by mouth at bedtime.        Contact information for after-discharge care     Destination     [facility] .   Service: Skilled Nursing Contact information: [address] [place]  [phone]  Discharge Exam: Filed Weights   07/25/23 0413 07/26/23 0500 07/27/23 0418  Weight: 80.8 kg 80.6 kg 83 kg   General exam: NAD.  Fatigued Respiratory system: Lungs clear.  No work of breathing.  Room air Cardiovascular system: S1-S2, RRR, no murmurs, no pedal edema Gastrointestinal system: Soft, NT/ND, normal bowel sounds Central nervous system: Alert and oriented. No focal neurological deficits. Extremities: Symmetric 5 x 5 power. Skin: Scattered ecchymoses on face and torso Psychiatry: Judgement and insight appear normal. Mood & affect appropriate.   Condition at discharge: fair  The results of significant diagnostics from this hospitalization (including imaging, microbiology, ancillary and laboratory) are listed below for reference.   Imaging Studies: ECHOCARDIOGRAM COMPLETE Result Date: 07/16/2023    ECHOCARDIOGRAM REPORT   Patient Name:   ELYSSIA STRAUSSER Date of Exam: 07/15/2023 Medical Rec #:  [id number]        Height:       66.0 in Accession #:    [id number]       Weight:       186.9 lb Date of Birth:  12/15/1950       BSA:          1.943 m Patient Age:    72 years         BP:           136/93 mmHg Patient Gender: F                HR:            79 bpm. Exam Location:  [facility] Procedure: 2D Echo, Cardiac Doppler and Color Doppler (Both Spectral and Color            Flow Doppler were utilized during procedure). Indications:     R55 Syncope  History:         Patient has prior history of Echocardiogram examinations, most                  recent 10/30/2021. Risk Factors:Hypertension, Diabetes and                  Dyslipidemia.  Sonographer:     Carl Coma RDCS Referring Phys:  [id number] CALVIN KATHEE ROBSON Diagnosing Phys: Cara JONETTA Lovelace MD IMPRESSIONS  1. Left ventricular ejection fraction, by estimation, is 55 to 60%. The left ventricle has normal function. The left ventricle has no regional wall motion abnormalities. Left ventricular diastolic parameters are consistent with Grade I diastolic dysfunction (impaired relaxation).  2. Right ventricular systolic function is normal. The right ventricular size is normal.  3. Left atrial size was mildly dilated.  4. The mitral valve is grossly normal. Trivial mitral valve regurgitation.  5. The aortic valve is normal in structure. Aortic valve regurgitation is not visualized. FINDINGS  Left Ventricle: Left ventricular ejection fraction, by estimation, is 55 to 60%. The left ventricle has normal function. The left ventricle has no regional wall motion abnormalities. Strain was performed and the global longitudinal strain is indeterminate. Global longitudinal strain performed but not reported based on interpreter judgement due to suboptimal tracking. The left ventricular internal cavity size was normal in size. There is no left ventricular hypertrophy. Left ventricular diastolic parameters are consistent with Grade I diastolic dysfunction (impaired relaxation). Right Ventricle: The right ventricular size is normal. No increase in right ventricular wall thickness. Right ventricular systolic function is normal. Left Atrium: Left atrial size was mildly dilated. Right Atrium: Right atrial size was normal in  size. Pericardium:  Trivial pericardial effusion is present. Mitral Valve: The mitral valve is grossly normal. Trivial mitral valve regurgitation. Tricuspid Valve: The tricuspid valve is normal in structure. Tricuspid valve regurgitation is mild. Aortic Valve: The aortic valve is normal in structure. Aortic valve regurgitation is not visualized. Pulmonic Valve: The pulmonic valve was normal in structure. Pulmonic valve regurgitation is not visualized. Aorta: The ascending aorta was not well visualized. IAS/Shunts: No atrial level shunt detected by color flow Doppler. Additional Comments: 3D was performed not requiring image post processing on an independent workstation and was indeterminate.  LEFT VENTRICLE PLAX 2D LVIDd:         4.70 cm   Diastology LVIDs:         3.30 cm   LV e' medial:    7.00 cm/s LV PW:         0.90 cm   LV E/e' medial:  10.7 LV IVS:        0.90 cm   LV e' lateral:   9.22 cm/s LVOT diam:     1.90 cm   LV E/e' lateral: 8.1 LV SV:         58 LV SV Index:   30 LVOT Area:     2.84 cm  RIGHT VENTRICLE             IVC RV Basal diam:  3.70 cm     IVC diam: 1.60 cm RV S prime:     12.33 cm/s TAPSE (M-mode): 2.3 cm LEFT ATRIUM             Index        RIGHT ATRIUM           Index LA diam:        4.20 cm 2.16 cm/m   RA Area:     11.50 cm LA Vol (A2C):   30.8 ml 15.85 ml/m  RA Volume:   26.40 ml  13.59 ml/m LA Vol (A4C):   20.2 ml 10.39 ml/m LA Biplane Vol: 25.3 ml 13.02 ml/m  AORTIC VALVE LVOT Vmax:   101.10 cm/s LVOT Vmean:  67.950 cm/s LVOT VTI:    0.203 m  AORTA Ao Root diam: 3.20 cm Ao Asc diam:  3.60 cm MITRAL VALVE MV Area (PHT): 3.35 cm    SHUNTS MV Decel Time: 226 msec    Systemic VTI:  0.20 m MV E velocity: 74.93 cm/s  Systemic Diam: 1.90 cm MV A velocity: 96.23 cm/s MV E/A ratio:  0.78 Dwayne D Callwood MD Electronically signed by Cara JONETTA Lovelace MD Signature Date/Time: 07/16/2023/7:37:37 AM    Final    DG Hand Complete Left Result Date: 07/14/2023 CLINICAL DATA:  fall, L chest wall  pain, shoulder pain, hand pain EXAM: LEFT HAND - COMPLETE 3+ VIEW COMPARISON:  None FINDINGS: No fracture or dislocation. Normal alignment. DJD at the first [facility] joint, and multiple interphalangeal joints most marked index and long D IP, little finger PIP. Regional soft tissues unremarkable. Carpal rows intact. IMPRESSION: 1. No acute findings. 2. Multifocal DJD. Electronically Signed   By: JONETTA Faes M.D.   On: 07/14/2023 17:21   DG Humerus Left Result Date: 07/14/2023 CLINICAL DATA:  Fall EXAM: LEFT HUMERUS - 2+ VIEW COMPARISON:  None Available. FINDINGS: There is no evidence of fracture or other focal bone lesions. Soft tissues are unremarkable. IMPRESSION: Negative. Electronically Signed   By: Greig Pique M.D.   On: 07/14/2023 17:20   DG Ribs Unilateral W/Chest Left Result Date: 07/14/2023  CLINICAL DATA:  Fall and left chest wall pain. EXAM: LEFT RIBS AND CHEST - 3+ VIEW COMPARISON:  Chest radiograph dated 03/13/2008. FINDINGS: No focal consolidation, pleural effusion, pneumothorax. The cardiac silhouette is within limits. No acute osseous pathology. No displaced rib fractures. IMPRESSION: 1. No acute cardiopulmonary process. 2. No displaced rib fractures. Electronically Signed   By: Vanetta Chou M.D.   On: 07/14/2023 17:19   CT Maxillofacial Wo Contrast Result Date: 07/14/2023 CLINICAL DATA:  Trauma, fall, bruising to forehead and left eye. EXAM: CT HEAD WITHOUT CONTRAST CT MAXILLOFACIAL WITHOUT CONTRAST CT CERVICAL SPINE WITHOUT CONTRAST TECHNIQUE: Multidetector CT imaging of the head, cervical spine, and maxillofacial structures were performed using the standard protocol without intravenous contrast. Multiplanar CT image reconstructions of the cervical spine and maxillofacial structures were also generated. RADIATION DOSE REDUCTION: This exam was performed according to the departmental dose-optimization program which includes automated exposure control, adjustment of the mA and/or kV according  to patient size and/or use of iterative reconstruction technique. COMPARISON:  CT head 01/28/2023 MRI cervical spine 06/27/2021. FINDINGS: CT HEAD FINDINGS Brain: No acute intracranial hemorrhage. No CT evidence of acute infarct. Nonspecific hypoattenuation in the periventricular and subcortical white matter favored to reflect chronic microvascular ischemic changes. Remote lacunar infarcts in the bilateral basal ganglia and in the right aspect of the pons. Mild generalized parenchymal volume loss. No edema, mass effect, or midline shift. The basilar cisterns are patent. Ventricles: The ventricles are normal. Vascular: Atherosclerotic calcifications of the carotid siphons and intracranial vertebral arteries. No hyperdense vessel. Skull: No acute or aggressive finding. Other: Mastoid air cells are clear. CT MAXILLOFACIAL FINDINGS Osseous: No fracture or mandibular dislocation. No destructive process. Orbits: Globes are intact. Lenses are normally located. Extraocular muscles and optic nerve sheath complexes are unremarkable. There is mild swelling of the preseptal soft tissues of the left orbit particularly along the inferior aspect. Sinuses: No significant mucosal thickening.  No air-fluid levels. Soft tissues: Hematoma in the left frontal scalp with surrounding soft tissue swelling which extends inferiorly over the supraorbital ridge and glabella with soft tissue swelling noted extending along the superolateral aspect of the left orbit. CT CERVICAL SPINE FINDINGS Alignment: Normal. Skull base and vertebrae: No acute fracture. No primary bone lesion or focal pathologic process. Soft tissues and spinal canal: No prevertebral fluid or swelling. No visible canal hematoma. Disc levels: Severe disc space narrowing at C5-6. Additional moderate disc space narrowing at C4-5 and C6-7. Disc osteophyte complexes at multiple levels. Moderate spinal canal stenosis at C4-5 and C5-6. Facet arthrosis and uncovertebral hypertrophy at  multiple levels. Severe foraminal stenosis on the right at C4-5. Moderate to severe foraminal stenosis bilaterally at C5-6. Upper chest: Negative. Other: None. IMPRESSION: No CT evidence of acute intracranial abnormality. No acute fracture or traumatic malalignment of the cervical spine. No acute maxillofacial fracture. Left frontal scalp hematoma with surrounding soft tissue swelling. Left facial and periorbital soft tissue swelling as above. Extensive chronic microvascular ischemic changes and mild parenchymal volume loss. Degenerative changes as above. Electronically Signed   By: Donnice Mania M.D.   On: 07/14/2023 15:38   CT HEAD WO CONTRAST ( ) Result Date: 07/14/2023 CLINICAL DATA:  Trauma, fall, bruising to forehead and left eye. EXAM: CT HEAD WITHOUT CONTRAST CT MAXILLOFACIAL WITHOUT CONTRAST CT CERVICAL SPINE WITHOUT CONTRAST TECHNIQUE: Multidetector CT imaging of the head, cervical spine, and maxillofacial structures were performed using the standard protocol without intravenous contrast. Multiplanar CT image reconstructions of the cervical spine and maxillofacial structures  were also generated. RADIATION DOSE REDUCTION: This exam was performed according to the departmental dose-optimization program which includes automated exposure control, adjustment of the mA and/or kV according to patient size and/or use of iterative reconstruction technique. COMPARISON:  CT head 01/28/2023 MRI cervical spine 06/27/2021. FINDINGS: CT HEAD FINDINGS Brain: No acute intracranial hemorrhage. No CT evidence of acute infarct. Nonspecific hypoattenuation in the periventricular and subcortical white matter favored to reflect chronic microvascular ischemic changes. Remote lacunar infarcts in the bilateral basal ganglia and in the right aspect of the pons. Mild generalized parenchymal volume loss. No edema, mass effect, or midline shift. The basilar cisterns are patent. Ventricles: The ventricles are normal. Vascular:  Atherosclerotic calcifications of the carotid siphons and intracranial vertebral arteries. No hyperdense vessel. Skull: No acute or aggressive finding. Other: Mastoid air cells are clear. CT MAXILLOFACIAL FINDINGS Osseous: No fracture or mandibular dislocation. No destructive process. Orbits: Globes are intact. Lenses are normally located. Extraocular muscles and optic nerve sheath complexes are unremarkable. There is mild swelling of the preseptal soft tissues of the left orbit particularly along the inferior aspect. Sinuses: No significant mucosal thickening.  No air-fluid levels. Soft tissues: Hematoma in the left frontal scalp with surrounding soft tissue swelling which extends inferiorly over the supraorbital ridge and glabella with soft tissue swelling noted extending along the superolateral aspect of the left orbit. CT CERVICAL SPINE FINDINGS Alignment: Normal. Skull base and vertebrae: No acute fracture. No primary bone lesion or focal pathologic process. Soft tissues and spinal canal: No prevertebral fluid or swelling. No visible canal hematoma. Disc levels: Severe disc space narrowing at C5-6. Additional moderate disc space narrowing at C4-5 and C6-7. Disc osteophyte complexes at multiple levels. Moderate spinal canal stenosis at C4-5 and C5-6. Facet arthrosis and uncovertebral hypertrophy at multiple levels. Severe foraminal stenosis on the right at C4-5. Moderate to severe foraminal stenosis bilaterally at C5-6. Upper chest: Negative. Other: None. IMPRESSION: No CT evidence of acute intracranial abnormality. No acute fracture or traumatic malalignment of the cervical spine. No acute maxillofacial fracture. Left frontal scalp hematoma with surrounding soft tissue swelling. Left facial and periorbital soft tissue swelling as above. Extensive chronic microvascular ischemic changes and mild parenchymal volume loss. Degenerative changes as above. Electronically Signed   By: Donnice Mania M.D.   On: 07/14/2023  15:38   CT Cervical Spine Wo Contrast Result Date: 07/14/2023 CLINICAL DATA:  Trauma, fall, bruising to forehead and left eye. EXAM: CT HEAD WITHOUT CONTRAST CT MAXILLOFACIAL WITHOUT CONTRAST CT CERVICAL SPINE WITHOUT CONTRAST TECHNIQUE: Multidetector CT imaging of the head, cervical spine, and maxillofacial structures were performed using the standard protocol without intravenous contrast. Multiplanar CT image reconstructions of the cervical spine and maxillofacial structures were also generated. RADIATION DOSE REDUCTION: This exam was performed according to the departmental dose-optimization program which includes automated exposure control, adjustment of the mA and/or kV according to patient size and/or use of iterative reconstruction technique. COMPARISON:  CT head 01/28/2023 MRI cervical spine 06/27/2021. FINDINGS: CT HEAD FINDINGS Brain: No acute intracranial hemorrhage. No CT evidence of acute infarct. Nonspecific hypoattenuation in the periventricular and subcortical white matter favored to reflect chronic microvascular ischemic changes. Remote lacunar infarcts in the bilateral basal ganglia and in the right aspect of the pons. Mild generalized parenchymal volume loss. No edema, mass effect, or midline shift. The basilar cisterns are patent. Ventricles: The ventricles are normal. Vascular: Atherosclerotic calcifications of the carotid siphons and intracranial vertebral arteries. No hyperdense vessel. Skull: No acute or aggressive finding.  Other: Mastoid air cells are clear. CT MAXILLOFACIAL FINDINGS Osseous: No fracture or mandibular dislocation. No destructive process. Orbits: Globes are intact. Lenses are normally located. Extraocular muscles and optic nerve sheath complexes are unremarkable. There is mild swelling of the preseptal soft tissues of the left orbit particularly along the inferior aspect. Sinuses: No significant mucosal thickening.  No air-fluid levels. Soft tissues: Hematoma in the left  frontal scalp with surrounding soft tissue swelling which extends inferiorly over the supraorbital ridge and glabella with soft tissue swelling noted extending along the superolateral aspect of the left orbit. CT CERVICAL SPINE FINDINGS Alignment: Normal. Skull base and vertebrae: No acute fracture. No primary bone lesion or focal pathologic process. Soft tissues and spinal canal: No prevertebral fluid or swelling. No visible canal hematoma. Disc levels: Severe disc space narrowing at C5-6. Additional moderate disc space narrowing at C4-5 and C6-7. Disc osteophyte complexes at multiple levels. Moderate spinal canal stenosis at C4-5 and C5-6. Facet arthrosis and uncovertebral hypertrophy at multiple levels. Severe foraminal stenosis on the right at C4-5. Moderate to severe foraminal stenosis bilaterally at C5-6. Upper chest: Negative. Other: None. IMPRESSION: No CT evidence of acute intracranial abnormality. No acute fracture or traumatic malalignment of the cervical spine. No acute maxillofacial fracture. Left frontal scalp hematoma with surrounding soft tissue swelling. Left facial and periorbital soft tissue swelling as above. Extensive chronic microvascular ischemic changes and mild parenchymal volume loss. Degenerative changes as above. Electronically Signed   By: Donnice Mania M.D.   On: 07/14/2023 15:38   DG Wrist Complete Right Result Date: 07/14/2023 CLINICAL DATA:  Pain after fall. EXAM: RIGHT WRIST - COMPLETE 3+ VIEW COMPARISON:  None Available. FINDINGS: There is no evidence of acute fracture or dislocation. Mild radiocarpal and first CMC joint space narrowing. 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 No radiopaque foreign body. IMPRESSION: 1. No acute osseous abnormality. 2. Mild radiocarpal and first CMC joint space narrowing. 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 Electronically Signed   By: Harrietta Sherry M.D.   On: 07/14/2023 14:36    Microbiology: Results for orders placed or  performed in visit on 03/02/23  Microscopic Examination     Status: Abnormal   Collection Time: 03/02/23  3:27 PM   Urine  Result Value Ref Range Status   WBC, UA 11-30 (A) 0 - 5 /hpf Final   RBC, Urine 0-2 0 - 2 /hpf Final   Epithelial Cells (non renal) 0-10 0 - 10 /hpf Final   Bacteria, UA Moderate (A) None seen/Few Final  CULTURE, URINE COMPREHENSIVE     Status: Abnormal   Collection Time: 03/02/23  3:42 PM   Specimen: Urine   UR  Result Value Ref Range Status   Urine Culture, Comprehensive Final report (A)  Final   Organism ID, Bacteria Klebsiella pneumoniae (A)  Final    Comment: Cefazolin with an MIC <=16 predicts susceptibility to the oral agents cefaclor, cefdinir, cefpodoxime, cefprozil, cefuroxime, cephalexin, and loracarbef when used for therapy of uncomplicated urinary tract infections due to E. coli, Klebsiella pneumoniae, and Proteus mirabilis. Greater than 100,000 colony forming units per mL    ANTIMICROBIAL SUSCEPTIBILITY Comment  Final    Comment:       ** S = Susceptible; I = Intermediate; R = Resistant **                    P = Positive; N = Negative  MICS are expressed in micrograms per mL    Antibiotic                 RSLT#1    RSLT#2    RSLT#3    RSLT#4 Amoxicillin/Clavulanic Acid    S Ampicillin                     R Cefazolin                      S Cefepime                       S Cefoxitin                      S Cefpodoxime                    S Ceftriaxone                    S Ciprofloxacin                  S Ertapenem                      S Gentamicin                     S Levofloxacin                   S Meropenem                      S Nitrofurantoin                  I Piperacillin/Tazobactam        S Tetracycline                   S Tobramycin                     S Trimethoprim/Sulfa             S     Labs: CBC: Recent Labs  Lab 07/23/23 0533 07/24/23 0425  WBC 7.3 13.9*  HGB 14.3 14.6  HCT 42.6 42.0  MCV 91.4 89.4  PLT  194 215   Basic Metabolic Panel: Recent Labs  Lab 07/23/23 0533 07/24/23 0425  NA 138 136  K 4.0 3.5  CL 101 100  CO2 25 24  GLUCOSE 121* 141*  BUN 21 19  CREATININE 0.82 0.84  CALCIUM 9.4 9.5   Liver Function Tests: No results for input(s): AST, ALT, ALKPHOS, BILITOT, PROT, ALBUMIN in the last 168 hours. CBG: No results for input(s): GLUCAP in the last 168 hours.  Discharge time spent: greater than 30 minutes.  Signed: Cresencio Fairly, MD [facility] 07/27/2023

## 2023-07-27 NOTE — TOC Progression Note (Signed)
 Transition of Care [facility]) - Progression Note    Patient Details  Name: Shannon Flores MRN: [id number] Date of Birth: 07-29-1950  Transition of Care [facility]) CM/SW Contact  Lauraine [name], LCSW Phone Number: 07/27/2023, 8:44 AM  Clinical Narrative:  [name] approved: [id number]. Valid 7/6-7/10. Left message for [facility] SNF admissions coordinator to notify and see if they can take her today.     Barriers to Discharge: Insurance Authorization  Expected Discharge Plan and Services                                               Social Determinants of Health (SDOH) Interventions SDOH Screenings   Food Insecurity: No Food Insecurity (07/14/2023)  Recent Concern: Food Insecurity - Food Insecurity Present (04/29/2023)   Received from [facility]  Housing: Low Risk  (07/14/2023)  Transportation Needs: Unmet Transportation Needs (07/14/2023)  Utilities: Not At Risk (07/14/2023)  Financial Resource Strain: Medium Risk (04/29/2023)   Received from [facility]  Physical Activity: Inactive (04/29/2023)   Received from [facility]  Social Connections: Moderately Isolated (07/14/2023)  Stress: Stress Concern Present (04/29/2023)   Received from [facility]  Tobacco Use: Low Risk  (07/14/2023)  Health Literacy: Adequate Health Literacy (04/29/2023)   Received from [facility]    Readmission Risk Interventions     No data to display

## 2023-07-27 NOTE — Plan of Care (Signed)
  Problem: Skin Integrity: Goal: Risk for impaired skin integrity will decrease Outcome: Progressing   Problem: Safety: Goal: Ability to remain free from injury will improve Outcome: Progressing   Problem: Pain Managment: Goal: General experience of comfort will improve and/or be controlled Outcome: Progressing   Problem: Coping: Goal: Level of anxiety will decrease Outcome: Progressing   Problem: Nutrition: Goal: Adequate nutrition will be maintained Outcome: Progressing   Problem: Activity: Goal: Risk for activity intolerance will decrease Outcome: Progressing   Problem: Education: Goal: Knowledge of General Education information will improve Description: Including pain rating scale, medication(s)/side effects and non-pharmacologic comfort measures Outcome: Progressing

## 2023-10-14 ENCOUNTER — Other Ambulatory Visit: Payer: Self-pay | Admitting: Family Medicine

## 2023-10-14 DIAGNOSIS — M5416 Radiculopathy, lumbar region: Secondary | ICD-10-CM

## 2023-10-22 ENCOUNTER — Other Ambulatory Visit

## 2023-12-16 ENCOUNTER — Ambulatory Visit: Admitting: Cardiology

## 2023-12-23 NOTE — Progress Notes (Deleted)
 Electrophysiology Clinic Note    Date:  12/23/2023  Patient ID:  Tenlee, Wollin 08-Aug-1950, MRN [id number] PCP:  [name], MD  Cardiologist:  None  Electrophysiologist:  Fonda Kitty, MD  Electrophysiology APP:  Wendy Hoback, NP    ***refresh  Discussed the use of AI scribe software for clinical note transcription with the patient, who gave verbal consent to proceed.   Patient Profile    Chief Complaint: ***  History of Present Illness: EVANTHIA MAUND is a 73 y.o. female with PMH notable for long QT syndrome, HTN, falls, T2DM, HTN, HLD, T2DM, anxiety, depression, chronic pain, fibromyalgia; seen today for Fonda Kitty, MD (formerly Dr. Fernande) for routine electrophysiology followup.   I last saw her 05/2022 where she continued to have falls d/t clutter in home and referred to SW. EKG with stable QT, BP stable.   In 06/2023 she woke up to eat something and take a medicine, started choking, so went to get neighbors and fell, hitting face with LOC. She was discharged to SNF ***  On follow-up today, *** falls   - zio monitor?   Since last being seen in our clinic the patient reports doing ***.  she denies chest pain, palpitations, dyspnea, PND, orthopnea, nausea, vomiting, dizziness, syncope, edema, weight gain, or early satiety.      Arrhythmia/Device History No specialty comments available.    ROS:  Please see the history of present illness. All other systems are reviewed and otherwise negative.    Physical Exam    VS:  There were no vitals taken for this visit. BMI: There is no height or weight on file to calculate BMI.           Wt Readings from Last 3 Encounters:  07/27/23 182 lb 15.7 oz (83 kg)  06/17/22 206 lb (93.4 kg)  05/23/21 207 lb 4 oz (94 kg)     GEN- The patient is well appearing, alert and oriented x 3 today.   Lungs- Clear to ausculation bilaterally, normal work of breathing.  Heart- {Blank  single:19197::Regular,Irregularly irregular} rate and rhythm, no murmurs, rubs or gallops Extremities- {EDEMA LEVEL:28147::No} peripheral edema, warm, dry  Studies Reviewed   Previous EP, cardiology notes.    EKG is ordered. Personal review of EKG from today shows:  ***        TTE, 07/15/2023  1. Left ventricular ejection fraction, by estimation, is 55 to 60%. The left ventricle has normal function. The left ventricle has no regional wall motion abnormalities. Left ventricular diastolic parameters are consistent with Grade I diastolic dysfunction (impaired relaxation).   2. Right ventricular systolic function is normal. The right ventricular size is normal.   3. Left atrial size was mildly dilated.   4. The mitral valve is grossly normal. Trivial mitral valve regurgitation.   5. The aortic valve is normal in structure. Aortic valve regurgitation is not visualized.   TTE, 10/30/2021  1. Left ventricular ejection fraction, by estimation, is 60 to 65%. The left ventricle has normal function. The left ventricle has no regional wall motion abnormalities. Left ventricular diastolic parameters are consistent with Grade I diastolic dysfunction (impaired relaxation).   2. Right ventricular systolic function is normal. The right ventricular size is normal.   3. The mitral valve is normal in structure. No evidence of mitral valve regurgitation.   4. The aortic valve is normal in structure. Aortic valve regurgitation is not visualized.   5. The inferior vena  cava is normal in size with greater than 50% respiratory variability, suggesting right atrial pressure of 3 mmHg.    NM myocardial perfusion, 05/16/2020 There was no ST segment deviation noted during stress. The study is normal. This is a low risk study. The left ventricular ejection fraction is hyperdynamic (EF is 75%). There is no evidence for ischemia     Assessment and Plan     #) Long QT   #) ***   {Are you ordering a CV  Procedure (e.g. stress test, cath, DCCV, TEE, etc)?   Press F2        :789639268}   Current medicines are reviewed at length with the patient today.   The patient {ACTIONS; HAS/DOES NOT HAVE:19233} concerns regarding her medicines.  The following changes were made today:  {NONE DEFAULTED:18576}  Labs/ tests ordered today include: *** No orders of the defined types were placed in this encounter.    Disposition: Follow up with {EPMDS:28135::EP Team} or EP APP {EPFOLLOW UP:28173}   Signed, Chantal Needle, NP  12/23/23  7:08 PM  Electrophysiology [facility]

## 2023-12-24 ENCOUNTER — Ambulatory Visit: Admitting: Cardiology

## 2023-12-24 DIAGNOSIS — R296 Repeated falls: Secondary | ICD-10-CM

## 2023-12-24 DIAGNOSIS — I4581 Long QT syndrome: Secondary | ICD-10-CM

## 2023-12-26 NOTE — Progress Notes (Deleted)
 Electrophysiology Clinic Note    Date:  12/26/2023  Patient ID:  Shannon, Flores 1950/12/26, MRN [id number] PCP:  [name], MD  Cardiologist:  None  Electrophysiologist:  Fonda Kitty, MD  Electrophysiology APP:  Kinser Fellman, NP    ***refresh  Discussed the use of AI scribe software for clinical note transcription with the patient, who gave verbal consent to proceed.   Patient Profile    Chief Complaint: ***  History of Present Illness: Shannon Flores is a 73 y.o. female with PMH notable for long QT syndrome, HTN, falls, T2DM, HTN, HLD, T2DM, anxiety, depression, chronic pain, fibromyalgia; seen today for Fonda Kitty, MD (formerly Dr. Fernande) for routine electrophysiology followup.   I last saw her 05/2022 where she continued to have falls d/t clutter in home and referred to SW. EKG with stable QT, BP stable.   In 06/2023 she woke up to eat something and take a medicine, started choking, so went to get neighbors and fell, hitting face with LOC. She was discharged to SNF ***  On follow-up today, *** falls   - zio monitor?   Since last being seen in our clinic the patient reports doing ***.  she denies chest pain, palpitations, dyspnea, PND, orthopnea, nausea, vomiting, dizziness, syncope, edema, weight gain, or early satiety.      Arrhythmia/Device History No specialty comments available.    ROS:  Please see the history of present illness. All other systems are reviewed and otherwise negative.    Physical Exam    VS:  There were no vitals taken for this visit. BMI: There is no height or weight on file to calculate BMI.           Wt Readings from Last 3 Encounters:  07/27/23 182 lb 15.7 oz (83 kg)  06/17/22 206 lb (93.4 kg)  05/23/21 207 lb 4 oz (94 kg)     GEN- The patient is well appearing, alert and oriented x 3 today.   Lungs- Clear to ausculation bilaterally, normal work of breathing.  Heart- {Blank  single:19197::Regular,Irregularly irregular} rate and rhythm, no murmurs, rubs or gallops Extremities- {EDEMA LEVEL:28147::No} peripheral edema, warm, dry  Studies Reviewed   Previous EP, cardiology notes.    EKG is ordered. Personal review of EKG from today shows:  ***        TTE, 07/15/2023  1. Left ventricular ejection fraction, by estimation, is 55 to 60%. The left ventricle has normal function. The left ventricle has no regional wall motion abnormalities. Left ventricular diastolic parameters are consistent with Grade I diastolic dysfunction (impaired relaxation).   2. Right ventricular systolic function is normal. The right ventricular size is normal.   3. Left atrial size was mildly dilated.   4. The mitral valve is grossly normal. Trivial mitral valve regurgitation.   5. The aortic valve is normal in structure. Aortic valve regurgitation is not visualized.   TTE, 10/30/2021  1. Left ventricular ejection fraction, by estimation, is 60 to 65%. The left ventricle has normal function. The left ventricle has no regional wall motion abnormalities. Left ventricular diastolic parameters are consistent with Grade I diastolic dysfunction (impaired relaxation).   2. Right ventricular systolic function is normal. The right ventricular size is normal.   3. The mitral valve is normal in structure. No evidence of mitral valve regurgitation.   4. The aortic valve is normal in structure. Aortic valve regurgitation is not visualized.   5. The inferior vena  cava is normal in size with greater than 50% respiratory variability, suggesting right atrial pressure of 3 mmHg.    NM myocardial perfusion, 05/16/2020 There was no ST segment deviation noted during stress. The study is normal. This is a low risk study. The left ventricular ejection fraction is hyperdynamic (EF is 75%). There is no evidence for ischemia     Assessment and Plan     #) Long QT   #) ***   {Are you ordering a CV  Procedure (e.g. stress test, cath, DCCV, TEE, etc)?   Press F2        :[id number]}   Current medicines are reviewed at length with the patient today.   The patient {ACTIONS; HAS/DOES NOT HAVE:19233} concerns regarding her medicines.  The following changes were made today:  {NONE DEFAULTED:18576}  Labs/ tests ordered today include: *** No orders of the defined types were placed in this encounter.    Disposition: Follow up with {EPMDS:28135::EP Team} or EP APP {EPFOLLOW UP:28173}   Signed, Chantal Needle, NP  12/26/23  4:27 PM  Electrophysiology [facility]

## 2023-12-28 ENCOUNTER — Ambulatory Visit: Admitting: Cardiology

## 2023-12-28 NOTE — Progress Notes (Deleted)
 Electrophysiology Clinic Note    Date:  12/28/2023  Patient ID:  Else, Shannon Flores 06-Feb-1950, MRN [id number] PCP:  [name], MD  Cardiologist:  None  Electrophysiologist:  Fonda Kitty, MD  Electrophysiology APP:  Larose Batres, NP    ***refresh  Discussed the use of AI scribe software for clinical note transcription with the patient, who gave verbal consent to proceed.   Patient Profile    Chief Complaint: ***  History of Present Illness: Shannon Flores is a 73 y.o. female with PMH notable for long QT syndrome, HTN, falls, T2DM, HTN, HLD, T2DM, anxiety, depression, chronic pain, fibromyalgia; seen today for Fonda Kitty, MD (formerly Dr. Fernande) for routine electrophysiology followup.   I last saw her 05/2022 where she continued to have falls d/t clutter in home and referred to SW. EKG with stable QT, BP stable.   In 06/2023 she woke up to eat something and take a medicine, started choking, so went to get neighbors and fell, hitting face with LOC. She was discharged to SNF ***  On follow-up today, *** falls   - zio monitor?   Since last being seen in our clinic the patient reports doing ***.  she denies chest pain, palpitations, dyspnea, PND, orthopnea, nausea, vomiting, dizziness, syncope, edema, weight gain, or early satiety.      Arrhythmia/Device History No specialty comments available.    ROS:  Please see the history of present illness. All other systems are reviewed and otherwise negative.    Physical Exam    VS:  There were no vitals taken for this visit. BMI: There is no height or weight on file to calculate BMI.           Wt Readings from Last 3 Encounters:  07/27/23 182 lb 15.7 oz (83 kg)  06/17/22 206 lb (93.4 kg)  05/23/21 207 lb 4 oz (94 kg)     GEN- The patient is well appearing, alert and oriented x 3 today.   Lungs- Clear to ausculation bilaterally, normal work of breathing.  Heart- {Blank  single:19197::Regular,Irregularly irregular} rate and rhythm, no murmurs, rubs or gallops Extremities- {EDEMA LEVEL:28147::No} peripheral edema, warm, dry  Studies Reviewed   Previous EP, cardiology notes.    EKG is ordered. Personal review of EKG from today shows:  ***        TTE, 07/15/2023  1. Left ventricular ejection fraction, by estimation, is 55 to 60%. The left ventricle has normal function. The left ventricle has no regional wall motion abnormalities. Left ventricular diastolic parameters are consistent with Grade I diastolic dysfunction (impaired relaxation).   2. Right ventricular systolic function is normal. The right ventricular size is normal.   3. Left atrial size was mildly dilated.   4. The mitral valve is grossly normal. Trivial mitral valve regurgitation.   5. The aortic valve is normal in structure. Aortic valve regurgitation is not visualized.   TTE, 10/30/2021  1. Left ventricular ejection fraction, by estimation, is 60 to 65%. The left ventricle has normal function. The left ventricle has no regional wall motion abnormalities. Left ventricular diastolic parameters are consistent with Grade I diastolic dysfunction (impaired relaxation).   2. Right ventricular systolic function is normal. The right ventricular size is normal.   3. The mitral valve is normal in structure. No evidence of mitral valve regurgitation.   4. The aortic valve is normal in structure. Aortic valve regurgitation is not visualized.   5. The inferior vena  cava is normal in size with greater than 50% respiratory variability, suggesting right atrial pressure of 3 mmHg.    NM myocardial perfusion, 05/16/2020 There was no ST segment deviation noted during stress. The study is normal. This is a low risk study. The left ventricular ejection fraction is hyperdynamic (EF is 75%). There is no evidence for ischemia     Assessment and Plan     #) Long QT   #) ***   {Are you ordering a CV  Procedure (e.g. stress test, cath, DCCV, TEE, etc)?   Press F2        :[id number]}   Current medicines are reviewed at length with the patient today.   The patient {ACTIONS; HAS/DOES NOT HAVE:19233} concerns regarding her medicines.  The following changes were made today:  {NONE DEFAULTED:18576}  Labs/ tests ordered today include: *** No orders of the defined types were placed in this encounter.    Disposition: Follow up with {EPMDS:28135::EP Team} or EP APP {EPFOLLOW UP:28173}   Signed, Chantal Needle, NP  12/28/23  1:17 PM  Electrophysiology [facility]

## 2024-01-05 ENCOUNTER — Ambulatory Visit: Admitting: Cardiology

## 2024-01-05 DIAGNOSIS — I4581 Long QT syndrome: Secondary | ICD-10-CM

## 2024-01-06 ENCOUNTER — Ambulatory Visit: Attending: Cardiology | Admitting: Cardiology

## 2024-01-06 ENCOUNTER — Encounter: Payer: Self-pay | Admitting: Cardiology

## 2024-01-06 VITALS — BP 100/68 | HR 85 | Ht 66.0 in | Wt 192.4 lb

## 2024-01-06 DIAGNOSIS — R55 Syncope and collapse: Secondary | ICD-10-CM | POA: Diagnosis not present

## 2024-01-06 DIAGNOSIS — I4581 Long QT syndrome: Secondary | ICD-10-CM | POA: Diagnosis not present

## 2024-01-06 DIAGNOSIS — R296 Repeated falls: Secondary | ICD-10-CM

## 2024-01-06 NOTE — Patient Instructions (Addendum)
 Medication Instructions:  Your physician recommends that you continue on your current medications as directed. Please refer to the Current Medication list given to you today.  *If you need a refill on your cardiac medications before your next appointment, please call your pharmacy*  Lab Work: No labs ordered today    No test ordered today     Follow-Up: At [facility], you and your health needs are our priority.  As part of our continuing mission to provide you with exceptional heart care, our providers are all part of one team.  This team includes your primary Cardiologist (physician) and Advanced Practice Providers or APPs (Physician Assistants and Nurse Practitioners) who all work together to provide you with the care you need, when you need it.  Your next appointment:    Dr. Fonda Kitty

## 2024-01-06 NOTE — Progress Notes (Signed)
 Electrophysiology Clinic Note    Date:  01/06/2024  Patient ID:  Shannon Flores 1950/01/25, MRN [id number] PCP:  [name], MD  Cardiologist:  None  Electrophysiologist:  Fonda Kitty, MD  Electrophysiology APP:  Reymond Maynez, NP    Discussed the use of AI scribe software for clinical note transcription with the patient, who gave verbal consent to proceed.   Patient Profile    Chief Complaint: syncope, long QT  History of Present Illness: Shannon Flores is a 73 y.o. female with PMH notable for long QT syndrome, HTN, falls, T2DM, HTN, HLD, T2DM, anxiety, depression, chronic pain, fibromyalgia; seen today for Fonda Kitty, MD (formerly Dr. Fernande) for routine electrophysiology followup.   I last saw her 05/2022 where she continued to have falls d/t clutter in home and referred to SW. EKG with stable QT, BP stable.   In 06/2023 she woke up to eat something and take a medicine, started choking, went to get neighbors and passed out, hitting face with LOC. She was discharged to SNF.   On follow-up today, she has not had any further falls or syncopal episodes. She questions whether choking was the cause of her syncope or her long QT. She does continue to mechanical falls.  She overall feels well without acute complaints today.     Arrhythmia/Device History No specialty comments available.    ROS:  Please see the history of present illness. All other systems are reviewed and otherwise negative.    Physical Exam    VS:  BP 100/68 (BP Location: Left Arm, Patient Position: Sitting, Cuff Size: Normal)   Pulse 85 Comment: 90 oximeter  Ht 5' 6 (1.676 m)   Wt 192 lb 6.4 oz (87.3 kg)   SpO2 97%   BMI 31.05 kg/m  BMI: Body mass index is 31.05 kg/m.           Wt Readings from Last 3 Encounters:  01/06/24 192 lb 6.4 oz (87.3 kg)  07/27/23 182 lb 15.7 oz (83 kg)  06/17/22 206 lb (93.4 kg)     GEN- The patient is unkept-appearing, alert and oriented x 3  today.   Lungs- Clear to ausculation bilaterally, normal work of breathing.  Heart- Regular rate and rhythm, no murmurs, rubs or gallops Extremities- No peripheral edema, warm, dry  Studies Reviewed   Previous EP, cardiology notes.    EKG is ordered. Personal review of EKG from today shows:    EKG Interpretation Date/Time:  Wednesday January 06 2024 14:58:59 EST Ventricular Rate:  85 PR Interval:  162 QRS Duration:  80 QT Interval:  394 QTC Calculation: 468 R Axis:   5  Text Interpretation: Normal sinus rhythm Normal ECG Confirmed by Angla Delahunt [phone]) on 01/06/2024 3:05:29 PM    07/2023 EKG - SR. QTc 471 06/2023 EKG - SR. QTc 491 05/2022 EKG - SR. QTc 464 05/2021 EKG - SR. QTc 452 02/2008 EKG - SR. QTc 481  TTE, 07/15/2023  1. Left ventricular ejection fraction, by estimation, is 55 to 60%. The left ventricle has normal function. The left ventricle has no regional wall motion abnormalities. Left ventricular diastolic parameters are consistent with Grade I diastolic dysfunction (impaired relaxation).   2. Right ventricular systolic function is normal. The right ventricular size is normal.   3. Left atrial size was mildly dilated.   4. The mitral valve is grossly normal. Trivial mitral valve regurgitation.   5. The aortic valve is normal in  structure. Aortic valve regurgitation is not visualized.   TTE, 10/30/2021  1. Left ventricular ejection fraction, by estimation, is 60 to 65%. The left ventricle has normal function. The left ventricle has no regional wall motion abnormalities. Left ventricular diastolic parameters are consistent with Grade I diastolic dysfunction (impaired relaxation).   2. Right ventricular systolic function is normal. The right ventricular size is normal.   3. The mitral valve is normal in structure. No evidence of mitral valve regurgitation.   4. The aortic valve is normal in structure. Aortic valve regurgitation is not visualized.   5. The inferior vena  cava is normal in size with greater than 50% respiratory variability, suggesting right atrial pressure of 3 mmHg.    NM myocardial perfusion, 05/16/2020 There was no ST segment deviation noted during stress. The study is normal. This is a low risk study. The left ventricular ejection fraction is hyperdynamic (EF is 75%). There is no evidence for ischemia   Assessment and Plan    #) Long QT #) syncope #) frequent falls Patient with long history of frequent falls. Records are concerning for syncope vs mechanical vs orthostatic hypotension. She recently had episode of syncope requiring prolonged hospitalization.  Review of EKGs shows overall stable, though prolonged QTC. Patient's recent syncopal event is concerning for arrhythmogenic cause. Given rarity of episodes, do not think a 2 week monitor will capture.  We discussed ILR implant to further evaluate including risks/benefits and procedural expectations She is agreeable to ILR implant. Will send to precert.        Current medicines are reviewed at length with the patient today.   The patient does not have concerns regarding her medicines.  The following changes were made today:  none  Labs/ tests ordered today include:  Orders Placed This Encounter  Procedures   EKG 12-Lead     Disposition: Follow up with Dr. Kennyth for ILR implant    Signed, Chantal Needle, NP  01/06/2024  3:22 PM  Electrophysiology [facility]

## 2024-01-27 ENCOUNTER — Encounter: Payer: Self-pay | Admitting: Family Medicine

## 2024-02-02 ENCOUNTER — Other Ambulatory Visit

## 2024-02-10 ENCOUNTER — Ambulatory Visit
Admission: RE | Admit: 2024-02-10 | Discharge: 2024-02-10 | Disposition: A | Source: Ambulatory Visit | Attending: Family Medicine | Admitting: Family Medicine

## 2024-02-10 DIAGNOSIS — M5416 Radiculopathy, lumbar region: Secondary | ICD-10-CM

## 2024-02-23 NOTE — Progress Notes (Unsigned)
" °  Electrophysiology Office Note:   Date:  02/23/2024  ID:  Shannon Flores, Shannon Flores 08/11/1950, MRN [id number]  Primary Cardiologist: None Electrophysiologist: Fonda Kitty, MD  {Click to update primary MD,subspecialty MD or APP then REFRESH:1}    History of Present Illness:   Shannon Flores is a 74 y.o. female with h/o long QT syndrome, HTN, falls, T2DM, HTN, HLD, T2DM, anxiety, depression, chronic pain, fibromyalgia who is being seen today for ILR implant at the request of Elvie Needle, NP.  Discussed the use of AI scribe software for clinical note transcription with the patient, who gave verbal consent to proceed.  History of Present Illness     Review of systems complete and found to be negative unless listed in HPI.   EP Information / Studies Reviewed:    {EKGtoday:28818}     Echo 07/15/23:   1. Left ventricular ejection fraction, by estimation, is 55 to 60%. The  left ventricle has normal function. The left ventricle has no regional  wall motion abnormalities. Left ventricular diastolic parameters are  consistent with Grade I diastolic  dysfunction (impaired relaxation).   2. Right ventricular systolic function is normal. The right ventricular  size is normal.   3. Left atrial size was mildly dilated.   4. The mitral valve is grossly normal. Trivial mitral valve  regurgitation.   5. The aortic valve is normal in structure. Aortic valve regurgitation is  not visualized.   Physical Exam:   VS:  There were no vitals taken for this visit.   Wt Readings from Last 3 Encounters:  01/06/24 192 lb 6.4 oz (87.3 kg)  07/27/23 182 lb 15.7 oz (83 kg)  06/17/22 206 lb (93.4 kg)     GEN: Well nourished, well developed in no acute distress NECK: No JVD CARDIAC: {EPRHYTHM:28826}, no murmurs, rubs, gallops RESPIRATORY:  Clear to auscultation without rales, wheezing or rhonchi  ABDOMEN: Soft, non-distended EXTREMITIES:  No edema; No deformity   ASSESSMENT AND PLAN:     #Syncope #Falls  #QT prolongation  Some palpitations; takes extra atenolol .  I do not think they are related to her QT prolongation; have encouraged her today in the context of her history of falls and the fact that she lives alone that she consider something like the Apple watch with fall alert technology; in the context of her palpitations, she could also use it for ECG recording   Moreover, she has fallen off the back stairs a number of times.  She is thought about having getting a railing; has not done it yet.  Have encouraged her to follow through and to let us  know that she has. Assessment & Plan       Follow up with {EPMDS:28135::EP Team} {EPFOLLOW LE:71826}  Signed, Fonda Kitty, MD  "

## 2024-02-24 ENCOUNTER — Ambulatory Visit: Admitting: Cardiology

## 2024-02-26 ENCOUNTER — Encounter: Payer: Self-pay | Admitting: Cardiology
